# Patient Record
Sex: Male | Born: 1970 | Race: White | Hispanic: No
Health system: Southern US, Academic
[De-identification: ages and names within clinical notes are randomized; demographics above are authoritative.]

## PROBLEM LIST (undated history)

## (undated) ENCOUNTER — Encounter

## (undated) ENCOUNTER — Ambulatory Visit: Payer: MEDICARE | Attending: Speech-Language Pathologist | Primary: Speech-Language Pathologist

## (undated) ENCOUNTER — Telehealth
Attending: Student in an Organized Health Care Education/Training Program | Primary: Student in an Organized Health Care Education/Training Program

## (undated) ENCOUNTER — Encounter: Attending: Internal Medicine | Primary: Internal Medicine

## (undated) ENCOUNTER — Telehealth

## (undated) ENCOUNTER — Encounter
Attending: Student in an Organized Health Care Education/Training Program | Primary: Student in an Organized Health Care Education/Training Program

## (undated) ENCOUNTER — Encounter: Attending: Adult Health | Primary: Adult Health

## (undated) ENCOUNTER — Ambulatory Visit: Attending: Clinical | Primary: Clinical

## (undated) ENCOUNTER — Ambulatory Visit: Payer: MEDICARE

## (undated) ENCOUNTER — Encounter: Attending: Sports Medicine | Primary: Sports Medicine

## (undated) ENCOUNTER — Ambulatory Visit
Payer: MEDICARE | Attending: Student in an Organized Health Care Education/Training Program | Primary: Student in an Organized Health Care Education/Training Program

## (undated) ENCOUNTER — Ambulatory Visit: Payer: MEDICARE | Attending: Adult Health | Primary: Adult Health

## (undated) ENCOUNTER — Telehealth: Attending: Social Worker | Primary: Social Worker

## (undated) ENCOUNTER — Ambulatory Visit

## (undated) ENCOUNTER — Ambulatory Visit: Attending: Family Medicine | Primary: Family Medicine

## (undated) ENCOUNTER — Ambulatory Visit: Payer: Medicare (Managed Care) | Attending: Internal Medicine | Primary: Internal Medicine

## (undated) ENCOUNTER — Encounter: Attending: Social Worker | Primary: Social Worker

## (undated) ENCOUNTER — Ambulatory Visit: Payer: MEDICARE | Attending: Pulmonary Disease | Primary: Pulmonary Disease

## (undated) ENCOUNTER — Ambulatory Visit
Payer: MEDICARE | Attending: Neurology with Special Qualifications in Child Neurology | Primary: Neurology with Special Qualifications in Child Neurology

## (undated) ENCOUNTER — Ambulatory Visit: Payer: Medicare (Managed Care)

## (undated) ENCOUNTER — Ambulatory Visit: Attending: Adult Health | Primary: Adult Health

## (undated) ENCOUNTER — Ambulatory Visit: Payer: Medicare (Managed Care) | Attending: Adult Health | Primary: Adult Health

## (undated) ENCOUNTER — Ambulatory Visit: Payer: MEDICARE | Attending: Sports Medicine | Primary: Sports Medicine

## (undated) ENCOUNTER — Encounter: Attending: Psychiatry | Primary: Psychiatry

## (undated) ENCOUNTER — Ambulatory Visit: Payer: MEDICARE | Attending: Medical Oncology | Primary: Medical Oncology

## (undated) DIAGNOSIS — I447 Left bundle-branch block, unspecified: Secondary | ICD-10-CM

## (undated) DIAGNOSIS — T7840XA Allergy, unspecified, initial encounter: Secondary | ICD-10-CM

## (undated) DIAGNOSIS — J449 Chronic obstructive pulmonary disease, unspecified: Secondary | ICD-10-CM

## (undated) DIAGNOSIS — K219 Gastro-esophageal reflux disease without esophagitis: Secondary | ICD-10-CM

## (undated) DIAGNOSIS — F319 Bipolar disorder, unspecified: Secondary | ICD-10-CM

## (undated) HISTORY — DX: Gastro-esophageal reflux disease without esophagitis: K21.9

## (undated) HISTORY — DX: Left bundle-branch block, unspecified: I44.7

## (undated) HISTORY — DX: Allergy, unspecified, initial encounter: T78.40XA

## (undated) HISTORY — DX: Chronic obstructive pulmonary disease, unspecified: J44.9

## (undated) MED ORDER — FLUOXETINE 20 MG TABLET: ORAL | 0 days

---
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
## 2000-02-19 ENCOUNTER — Encounter: Payer: Self-pay | Admitting: Specialist

## 2000-02-19 ENCOUNTER — Ambulatory Visit (HOSPITAL_COMMUNITY): Admission: RE | Admit: 2000-02-19 | Discharge: 2000-02-19 | Payer: Self-pay | Admitting: Specialist

## 2000-02-28 ENCOUNTER — Encounter: Payer: Self-pay | Admitting: Specialist

## 2000-02-28 ENCOUNTER — Ambulatory Visit (HOSPITAL_COMMUNITY): Admission: RE | Admit: 2000-02-28 | Discharge: 2000-02-28 | Payer: Self-pay | Admitting: Specialist

## 2000-12-27 ENCOUNTER — Encounter: Payer: Self-pay | Admitting: Unknown Physician Specialty

## 2000-12-27 ENCOUNTER — Encounter: Admission: RE | Admit: 2000-12-27 | Discharge: 2000-12-27 | Payer: Self-pay | Admitting: Unknown Physician Specialty

## 2001-07-29 ENCOUNTER — Encounter: Payer: Self-pay | Admitting: Emergency Medicine

## 2001-07-29 ENCOUNTER — Emergency Department (HOSPITAL_COMMUNITY): Admission: AC | Admit: 2001-07-29 | Discharge: 2001-07-29 | Payer: Self-pay

## 2004-12-08 ENCOUNTER — Emergency Department: Payer: Self-pay | Admitting: Emergency Medicine

## 2004-12-21 ENCOUNTER — Emergency Department: Payer: Self-pay | Admitting: Emergency Medicine

## 2005-02-11 ENCOUNTER — Emergency Department: Payer: Self-pay | Admitting: Emergency Medicine

## 2005-04-04 ENCOUNTER — Inpatient Hospital Stay (HOSPITAL_COMMUNITY): Admission: RE | Admit: 2005-04-04 | Discharge: 2005-04-10 | Payer: Self-pay | Admitting: Psychiatry

## 2005-04-05 ENCOUNTER — Ambulatory Visit: Payer: Self-pay | Admitting: Psychiatry

## 2005-08-22 ENCOUNTER — Emergency Department: Payer: Self-pay | Admitting: Internal Medicine

## 2005-09-24 ENCOUNTER — Emergency Department: Payer: Self-pay | Admitting: Emergency Medicine

## 2006-02-26 ENCOUNTER — Emergency Department: Payer: Self-pay | Admitting: Emergency Medicine

## 2006-03-09 ENCOUNTER — Emergency Department: Payer: Self-pay | Admitting: General Practice

## 2006-05-06 ENCOUNTER — Ambulatory Visit: Payer: Self-pay | Admitting: Urology

## 2006-06-06 ENCOUNTER — Ambulatory Visit: Payer: Self-pay | Admitting: Urology

## 2006-09-05 ENCOUNTER — Emergency Department: Payer: Self-pay | Admitting: Emergency Medicine

## 2006-10-23 ENCOUNTER — Other Ambulatory Visit: Payer: Self-pay

## 2006-10-23 ENCOUNTER — Emergency Department: Payer: Self-pay | Admitting: Emergency Medicine

## 2006-11-04 ENCOUNTER — Ambulatory Visit: Payer: Self-pay | Admitting: Internal Medicine

## 2006-11-27 ENCOUNTER — Emergency Department: Payer: Self-pay | Admitting: Emergency Medicine

## 2007-01-26 ENCOUNTER — Emergency Department: Payer: Self-pay | Admitting: Emergency Medicine

## 2007-01-26 ENCOUNTER — Other Ambulatory Visit: Payer: Self-pay

## 2007-12-09 ENCOUNTER — Emergency Department: Payer: Self-pay | Admitting: Emergency Medicine

## 2008-04-11 ENCOUNTER — Emergency Department: Payer: Self-pay | Admitting: Emergency Medicine

## 2008-06-16 ENCOUNTER — Inpatient Hospital Stay: Payer: Self-pay | Admitting: Psychiatry

## 2011-05-16 ENCOUNTER — Emergency Department: Payer: Self-pay | Admitting: Emergency Medicine

## 2011-12-31 ENCOUNTER — Emergency Department: Payer: Self-pay | Admitting: Emergency Medicine

## 2011-12-31 LAB — CBC WITH DIFFERENTIAL/PLATELET
Basophil #: 0 10*3/uL (ref 0.0–0.1)
Basophil %: 0.5 %
Eosinophil #: 0.1 10*3/uL (ref 0.0–0.7)
Eosinophil %: 0.9 %
HCT: 35.5 % — ABNORMAL LOW (ref 40.0–52.0)
HGB: 12.6 g/dL — ABNORMAL LOW (ref 13.0–18.0)
Lymphocyte #: 1.2 10*3/uL (ref 1.0–3.6)
Lymphocyte %: 14.9 %
MCH: 29.8 pg (ref 26.0–34.0)
MCHC: 35.4 g/dL (ref 32.0–36.0)
MCV: 84 fL (ref 80–100)
Monocyte #: 0.7 x10 3/mm (ref 0.2–1.0)
Monocyte %: 9.2 %
Neutrophil #: 6 10*3/uL (ref 1.4–6.5)
Neutrophil %: 74.5 %
Platelet: 220 10*3/uL (ref 150–440)
RBC: 4.22 10*6/uL — ABNORMAL LOW (ref 4.40–5.90)
RDW: 13.9 % (ref 11.5–14.5)
WBC: 8 10*3/uL (ref 3.8–10.6)

## 2011-12-31 LAB — BASIC METABOLIC PANEL
Anion Gap: 8 (ref 7–16)
BUN: 10 mg/dL (ref 7–18)
Calcium, Total: 10 mg/dL (ref 8.5–10.1)
Chloride: 105 mmol/L (ref 98–107)
Co2: 26 mmol/L (ref 21–32)
Creatinine: 1.38 mg/dL — ABNORMAL HIGH (ref 0.60–1.30)
EGFR (African American): >60
EGFR (Non-African Amer.): >60
Glucose: 98 mg/dL (ref 65–99)
Osmolality: 277 (ref 275–301)
Potassium: 3.5 mmol/L (ref 3.5–5.1)
Sodium: 139 mmol/L (ref 136–145)

## 2012-01-01 ENCOUNTER — Ambulatory Visit: Payer: Self-pay | Admitting: Oncology

## 2012-01-02 ENCOUNTER — Ambulatory Visit: Payer: Self-pay | Admitting: Oncology

## 2012-01-02 LAB — PROTIME-INR
INR: 1.1
Prothrombin Time: 15 secs — ABNORMAL HIGH (ref 11.5–14.7)

## 2012-01-02 LAB — APTT: Activated PTT: 32.6 secs (ref 23.6–35.9)

## 2012-01-10 ENCOUNTER — Ambulatory Visit: Payer: Self-pay | Admitting: Surgery

## 2012-01-24 LAB — PATHOLOGY REPORT

## 2012-01-25 ENCOUNTER — Ambulatory Visit: Payer: Self-pay | Admitting: Oncology

## 2012-01-28 ENCOUNTER — Ambulatory Visit: Payer: Self-pay | Admitting: Oncology

## 2012-02-06 LAB — CBC CANCER CENTER
Basophil #: 0 x10 3/mm (ref 0.0–0.1)
Basophil %: 0.6 %
Eosinophil #: 0.2 x10 3/mm (ref 0.0–0.7)
Eosinophil %: 2.3 %
HCT: 36.3 % — ABNORMAL LOW (ref 40.0–52.0)
HGB: 12.3 g/dL — ABNORMAL LOW (ref 13.0–18.0)
Lymphocyte #: 1.4 x10 3/mm (ref 1.0–3.6)
Lymphocyte %: 17 %
MCH: 29.5 pg (ref 26.0–34.0)
MCHC: 33.9 g/dL (ref 32.0–36.0)
MCV: 87 fL (ref 80–100)
Monocyte #: 0.5 x10 3/mm (ref 0.2–1.0)
Monocyte %: 5.5 %
Neutrophil #: 6.4 x10 3/mm (ref 1.4–6.5)
Neutrophil %: 74.6 %
Platelet: 225 x10 3/mm (ref 150–440)
RBC: 4.17 10*6/uL — ABNORMAL LOW (ref 4.40–5.90)
RDW: 14.1 % (ref 11.5–14.5)
WBC: 8.5 x10 3/mm (ref 3.8–10.6)

## 2012-02-06 LAB — COMPREHENSIVE METABOLIC PANEL WITH GFR
Albumin: 4.1 g/dL
Alkaline Phosphatase: 70 U/L
Anion Gap: 11
BUN: 13 mg/dL
Bilirubin,Total: 0.2 mg/dL
Calcium, Total: 10.2 mg/dL — ABNORMAL HIGH
Chloride: 101 mmol/L
Co2: 26 mmol/L
Creatinine: 1.43 mg/dL — ABNORMAL HIGH
EGFR (African American): >60
EGFR (Non-African Amer.): 60 — ABNORMAL LOW
Glucose: 160 mg/dL — ABNORMAL HIGH
Osmolality: 279
Potassium: 4 mmol/L
SGOT(AST): 15 U/L
SGPT (ALT): 28 U/L
Sodium: 138 mmol/L
Total Protein: 8 g/dL

## 2012-02-13 LAB — COMPREHENSIVE METABOLIC PANEL
Albumin: 4 g/dL (ref 3.4–5.0)
Alkaline Phosphatase: 66 U/L (ref 50–136)
Anion Gap: 10 (ref 7–16)
BUN: 13 mg/dL (ref 7–18)
Bilirubin,Total: 0.1 mg/dL — ABNORMAL LOW (ref 0.2–1.0)
Calcium, Total: 9.4 mg/dL (ref 8.5–10.1)
Chloride: 102 mmol/L (ref 98–107)
Co2: 26 mmol/L (ref 21–32)
Creatinine: 1.1 mg/dL (ref 0.60–1.30)
EGFR (African American): >60
EGFR (Non-African Amer.): >60
Glucose: 101 mg/dL — ABNORMAL HIGH (ref 65–99)
Osmolality: 276 (ref 275–301)
Potassium: 3.9 mmol/L (ref 3.5–5.1)
SGOT(AST): 21 U/L (ref 15–37)
SGPT (ALT): 43 U/L (ref 12–78)
Sodium: 138 mmol/L (ref 136–145)
Total Protein: 7.9 g/dL (ref 6.4–8.2)

## 2012-02-13 LAB — CBC CANCER CENTER
Basophil #: 0 x10 3/mm (ref 0.0–0.1)
Basophil %: 0.8 %
Eosinophil #: 0.2 x10 3/mm (ref 0.0–0.7)
Eosinophil %: 4.7 %
HCT: 33.6 % — ABNORMAL LOW (ref 40.0–52.0)
HGB: 11.4 g/dL — ABNORMAL LOW (ref 13.0–18.0)
Lymphocyte #: 1.2 x10 3/mm (ref 1.0–3.6)
Lymphocyte %: 32.7 %
MCH: 29 pg (ref 26.0–34.0)
MCHC: 33.8 g/dL (ref 32.0–36.0)
MCV: 86 fL (ref 80–100)
Monocyte #: 0.1 x10 3/mm — ABNORMAL LOW (ref 0.2–1.0)
Monocyte %: 1.6 %
Neutrophil #: 2.2 x10 3/mm (ref 1.4–6.5)
Neutrophil %: 60.2 %
Platelet: 220 x10 3/mm (ref 150–440)
RBC: 3.93 10*6/uL — ABNORMAL LOW (ref 4.40–5.90)
RDW: 13.9 % (ref 11.5–14.5)
WBC: 3.6 x10 3/mm — ABNORMAL LOW (ref 3.8–10.6)

## 2012-02-20 LAB — COMPREHENSIVE METABOLIC PANEL
Albumin: 3.8 g/dL (ref 3.4–5.0)
Alkaline Phosphatase: 66 U/L (ref 50–136)
BUN: 9 mg/dL (ref 7–18)
Bilirubin,Total: 0.2 mg/dL (ref 0.2–1.0)
Chloride: 104 mmol/L (ref 98–107)
EGFR (Non-African Amer.): >60
Glucose: 139 mg/dL — ABNORMAL HIGH (ref 65–99)
Osmolality: 282 (ref 275–301)
SGOT(AST): 40 U/L — ABNORMAL HIGH (ref 15–37)
SGPT (ALT): 68 U/L (ref 12–78)
Total Protein: 7.6 g/dL (ref 6.4–8.2)

## 2012-02-20 LAB — CBC CANCER CENTER
Basophil #: 0 x10 3/mm (ref 0.0–0.1)
Basophil %: 0.9 %
Eosinophil #: 0.1 x10 3/mm (ref 0.0–0.7)
Eosinophil %: 3.2 %
HCT: 35.5 % — ABNORMAL LOW (ref 40.0–52.0)
HGB: 11.8 g/dL — ABNORMAL LOW (ref 13.0–18.0)
Lymphocyte #: 1.3 x10 3/mm (ref 1.0–3.6)
Lymphocyte %: 59 %
MCH: 28.6 pg (ref 26.0–34.0)
MCHC: 33.2 g/dL (ref 32.0–36.0)
MCV: 86 fL (ref 80–100)
Monocyte #: 0.2 x10 3/mm (ref 0.2–1.0)
Monocyte %: 11.1 %
Neutrophil #: 0.6 x10 3/mm — ABNORMAL LOW (ref 1.4–6.5)
Neutrophil %: 25.8 %
Platelet: 257 x10 3/mm (ref 150–440)
RBC: 4.12 10*6/uL — ABNORMAL LOW (ref 4.40–5.90)
RDW: 14.2 % (ref 11.5–14.5)
WBC: 2.2 x10 3/mm — ABNORMAL LOW (ref 3.8–10.6)

## 2012-02-24 ENCOUNTER — Ambulatory Visit: Payer: Self-pay | Admitting: Oncology

## 2012-02-27 LAB — COMPREHENSIVE METABOLIC PANEL
Albumin: 4 g/dL (ref 3.4–5.0)
Alkaline Phosphatase: 60 U/L (ref 50–136)
Anion Gap: 12 (ref 7–16)
Bilirubin,Total: 0.2 mg/dL (ref 0.2–1.0)
Calcium, Total: 9.1 mg/dL (ref 8.5–10.1)
Chloride: 105 mmol/L (ref 98–107)
EGFR (African American): >60
Potassium: 3.7 mmol/L (ref 3.5–5.1)
SGOT(AST): 26 U/L (ref 15–37)
Total Protein: 7.9 g/dL (ref 6.4–8.2)

## 2012-02-27 LAB — CBC CANCER CENTER
Basophil %: 1 %
Eosinophil %: 0.6 %
HGB: 12.5 g/dL — ABNORMAL LOW (ref 13.0–18.0)
Lymphocyte #: 2 x10 3/mm (ref 1.0–3.6)
Monocyte %: 9.9 %
Neutrophil #: 3 x10 3/mm (ref 1.4–6.5)
Neutrophil %: 53.5 %
RBC: 4.41 10*6/uL (ref 4.40–5.90)
WBC: 5.7 x10 3/mm (ref 3.8–10.6)

## 2012-03-05 LAB — CBC CANCER CENTER
Basophil %: 0.6 %
Eosinophil #: 0.1 x10 3/mm (ref 0.0–0.7)
HCT: 35.6 % — ABNORMAL LOW (ref 40.0–52.0)
HGB: 12.2 g/dL — ABNORMAL LOW (ref 13.0–18.0)
Lymphocyte #: 1.6 x10 3/mm (ref 1.0–3.6)
Lymphocyte %: 18.9 %
MCH: 28.2 pg (ref 26.0–34.0)
MCHC: 34.3 g/dL (ref 32.0–36.0)
MCV: 82 fL (ref 80–100)
Monocyte %: 3.6 %
Neutrophil #: 6.5 x10 3/mm (ref 1.4–6.5)
Platelet: 164 x10 3/mm (ref 150–440)
RBC: 4.32 10*6/uL — ABNORMAL LOW (ref 4.40–5.90)
RDW: 13.8 % (ref 11.5–14.5)

## 2012-03-11 ENCOUNTER — Ambulatory Visit: Payer: Self-pay | Admitting: Vascular Surgery

## 2012-03-12 LAB — CBC CANCER CENTER
Basophil #: 0 x10 3/mm (ref 0.0–0.1)
Basophil %: 0.4 %
Eosinophil #: 0.1 x10 3/mm (ref 0.0–0.7)
HCT: 35.6 % — ABNORMAL LOW (ref 40.0–52.0)
HGB: 12.2 g/dL — ABNORMAL LOW (ref 13.0–18.0)
Lymphocyte #: 1.5 x10 3/mm (ref 1.0–3.6)
Lymphocyte %: 24.2 %
MCH: 28.5 pg (ref 26.0–34.0)
MCHC: 34.1 g/dL (ref 32.0–36.0)
MCV: 83 fL (ref 80–100)
Monocyte #: 0.4 x10 3/mm (ref 0.2–1.0)
Neutrophil #: 4.2 x10 3/mm (ref 1.4–6.5)
RBC: 4.27 10*6/uL — ABNORMAL LOW (ref 4.40–5.90)
RDW: 14.4 % (ref 11.5–14.5)

## 2012-03-12 LAB — COMPREHENSIVE METABOLIC PANEL
Alkaline Phosphatase: 80 U/L (ref 50–136)
Anion Gap: 11 (ref 7–16)
BUN: 11 mg/dL (ref 7–18)
Chloride: 104 mmol/L (ref 98–107)
EGFR (African American): >60
EGFR (Non-African Amer.): >60
Potassium: 3.7 mmol/L (ref 3.5–5.1)
SGOT(AST): 21 U/L (ref 15–37)
Total Protein: 7.6 g/dL (ref 6.4–8.2)

## 2012-03-26 ENCOUNTER — Ambulatory Visit: Payer: Self-pay | Admitting: Oncology

## 2012-03-27 LAB — COMPREHENSIVE METABOLIC PANEL
Bilirubin,Total: 0.2 mg/dL (ref 0.2–1.0)
Calcium, Total: 9 mg/dL (ref 8.5–10.1)
Co2: 24 mmol/L (ref 21–32)
Creatinine: 0.98 mg/dL (ref 0.60–1.30)
EGFR (African American): >60
Osmolality: 280 (ref 275–301)
Potassium: 3.8 mmol/L (ref 3.5–5.1)
SGPT (ALT): 50 U/L (ref 12–78)
Total Protein: 7.1 g/dL (ref 6.4–8.2)

## 2012-03-27 LAB — CBC CANCER CENTER
Basophil #: 0 x10 3/mm (ref 0.0–0.1)
Basophil %: 0.3 %
Eosinophil #: 0.2 x10 3/mm (ref 0.0–0.7)
HGB: 11 g/dL — ABNORMAL LOW (ref 13.0–18.0)
Lymphocyte #: 1.5 x10 3/mm (ref 1.0–3.6)
MCV: 82 fL (ref 80–100)
Monocyte %: 6.4 %
Neutrophil #: 7.1 x10 3/mm — ABNORMAL HIGH (ref 1.4–6.5)
RBC: 3.86 10*6/uL — ABNORMAL LOW (ref 4.40–5.90)
RDW: 15.7 % — ABNORMAL HIGH (ref 11.5–14.5)
WBC: 9.4 x10 3/mm (ref 3.8–10.6)

## 2012-04-09 LAB — COMPREHENSIVE METABOLIC PANEL
Albumin: 3.9 g/dL (ref 3.4–5.0)
Alkaline Phosphatase: 107 U/L (ref 50–136)
BUN: 17 mg/dL (ref 7–18)
Calcium, Total: 9.3 mg/dL (ref 8.5–10.1)
Co2: 27 mmol/L (ref 21–32)
EGFR (African American): >60
EGFR (Non-African Amer.): >60
Glucose: 122 mg/dL — ABNORMAL HIGH (ref 65–99)
Potassium: 3.8 mmol/L (ref 3.5–5.1)
Sodium: 139 mmol/L (ref 136–145)
Total Protein: 7.3 g/dL (ref 6.4–8.2)

## 2012-04-09 LAB — CBC CANCER CENTER
Basophil #: 0.1 x10 3/mm (ref 0.0–0.1)
Basophil %: 0.8 %
Eosinophil #: 0.1 x10 3/mm (ref 0.0–0.7)
Lymphocyte %: 17.3 %
Monocyte #: 0.8 x10 3/mm (ref 0.2–1.0)
Neutrophil #: 7.4 x10 3/mm — ABNORMAL HIGH (ref 1.4–6.5)
RBC: 3.84 10*6/uL — ABNORMAL LOW (ref 4.40–5.90)
RDW: 16.8 % — ABNORMAL HIGH (ref 11.5–14.5)
WBC: 10.1 x10 3/mm (ref 3.8–10.6)

## 2012-04-26 ENCOUNTER — Ambulatory Visit: Payer: Self-pay | Admitting: Oncology

## 2012-04-30 LAB — CBC CANCER CENTER
Basophil #: 0.1 x10 3/mm (ref 0.0–0.1)
Basophil %: 1 %
Eosinophil #: 0.1 x10 3/mm (ref 0.0–0.7)
Eosinophil %: 1.4 %
HCT: 33.4 % — ABNORMAL LOW (ref 40.0–52.0)
Lymphocyte #: 1.3 x10 3/mm (ref 1.0–3.6)
Lymphocyte %: 21.4 %
MCH: 27.9 pg (ref 26.0–34.0)
MCHC: 33.9 g/dL (ref 32.0–36.0)
MCV: 82 fL (ref 80–100)
Monocyte %: 7.6 %
Neutrophil #: 4.3 x10 3/mm (ref 1.4–6.5)
Neutrophil %: 68.6 %
Platelet: 224 x10 3/mm (ref 150–440)
RBC: 4.05 10*6/uL — ABNORMAL LOW (ref 4.40–5.90)
RDW: 18 % — ABNORMAL HIGH (ref 11.5–14.5)

## 2012-04-30 LAB — COMPREHENSIVE METABOLIC PANEL
Alkaline Phosphatase: 78 U/L (ref 50–136)
Anion Gap: 11 (ref 7–16)
Chloride: 104 mmol/L (ref 98–107)
Co2: 27 mmol/L (ref 21–32)
Creatinine: 1.11 mg/dL (ref 0.60–1.30)
Glucose: 140 mg/dL — ABNORMAL HIGH (ref 65–99)
Osmolality: 284 (ref 275–301)
SGPT (ALT): 69 U/L (ref 12–78)

## 2012-05-15 ENCOUNTER — Ambulatory Visit: Payer: Self-pay | Admitting: Oncology

## 2012-05-24 ENCOUNTER — Ambulatory Visit: Payer: Self-pay | Admitting: Oncology

## 2012-06-24 ENCOUNTER — Ambulatory Visit: Payer: Self-pay | Admitting: Oncology

## 2012-07-09 LAB — CBC CANCER CENTER
Basophil #: 0 x10 3/mm (ref 0.0–0.1)
Eosinophil #: 0.1 x10 3/mm (ref 0.0–0.7)
Eosinophil %: 3.3 %
HCT: 40.7 % (ref 40.0–52.0)
MCH: 26.8 pg (ref 26.0–34.0)
MCV: 80 fL (ref 80–100)
Monocyte #: 0.2 x10 3/mm (ref 0.2–1.0)
Monocyte %: 7 %

## 2012-07-16 LAB — CBC CANCER CENTER
Basophil #: 0 x10 3/mm (ref 0.0–0.1)
Basophil %: 0.3 %
Eosinophil #: 0.1 x10 3/mm (ref 0.0–0.7)
Eosinophil %: 2.8 %
HCT: 38 % — ABNORMAL LOW (ref 40.0–52.0)
HGB: 12.9 g/dL — ABNORMAL LOW (ref 13.0–18.0)
Lymphocyte #: 0.4 x10 3/mm — ABNORMAL LOW (ref 1.0–3.6)
Lymphocyte %: 10.3 %
MCH: 26.5 pg (ref 26.0–34.0)
MCHC: 33.8 g/dL (ref 32.0–36.0)
Monocyte #: 0.3 x10 3/mm (ref 0.2–1.0)
Neutrophil #: 2.7 x10 3/mm (ref 1.4–6.5)
Neutrophil %: 77.6 %
Platelet: 120 x10 3/mm — ABNORMAL LOW (ref 150–440)
RDW: 15.6 % — ABNORMAL HIGH (ref 11.5–14.5)
WBC: 3.5 x10 3/mm — ABNORMAL LOW (ref 3.8–10.6)

## 2012-07-24 ENCOUNTER — Ambulatory Visit: Payer: Self-pay | Admitting: Oncology

## 2012-09-08 ENCOUNTER — Emergency Department: Payer: Self-pay | Admitting: Emergency Medicine

## 2012-09-08 LAB — COMPREHENSIVE METABOLIC PANEL
Albumin: 4.1 g/dL (ref 3.4–5.0)
Anion Gap: 8 (ref 7–16)
Bilirubin,Total: 0.3 mg/dL (ref 0.2–1.0)
Chloride: 107 mmol/L (ref 98–107)
Co2: 24 mmol/L (ref 21–32)
Creatinine: 1.03 mg/dL (ref 0.60–1.30)
Osmolality: 275 (ref 275–301)
Potassium: 3.6 mmol/L (ref 3.5–5.1)
SGOT(AST): 20 U/L (ref 15–37)
SGPT (ALT): 31 U/L (ref 12–78)
Sodium: 139 mmol/L (ref 136–145)

## 2012-09-08 LAB — CBC
HCT: 33.6 % — ABNORMAL LOW (ref 40.0–52.0)
MCHC: 35.4 g/dL (ref 32.0–36.0)
MCV: 77 fL — ABNORMAL LOW (ref 80–100)
Platelet: 207 10*3/uL (ref 150–440)
RBC: 4.39 10*6/uL — ABNORMAL LOW (ref 4.40–5.90)
WBC: 5.3 10*3/uL (ref 3.8–10.6)

## 2012-09-08 LAB — DRUG SCREEN, URINE

## 2012-09-08 LAB — URINALYSIS, COMPLETE
Bacteria: NONE SEEN
Glucose,UR: NEGATIVE mg/dL (ref 0–75)
Nitrite: NEGATIVE
Ph: 6 (ref 4.5–8.0)
RBC,UR: <1 /HPF (ref 0–5)
WBC UR: NONE SEEN /HPF (ref 0–5)

## 2012-09-08 LAB — TSH: Thyroid Stimulating Horm: 0.09 u[IU]/mL — ABNORMAL LOW

## 2012-09-08 LAB — ETHANOL: Ethanol %: <0.003 % (ref 0.000–0.080)

## 2012-09-25 ENCOUNTER — Ambulatory Visit: Payer: Self-pay | Admitting: Oncology

## 2012-10-13 ENCOUNTER — Ambulatory Visit: Payer: Self-pay | Admitting: Oncology

## 2012-10-20 LAB — BASIC METABOLIC PANEL
BUN: 12 mg/dL (ref 7–18)
Chloride: 105 mmol/L (ref 98–107)
Co2: 26 mmol/L (ref 21–32)
Glucose: 97 mg/dL (ref 65–99)
Sodium: 141 mmol/L (ref 136–145)

## 2012-10-20 LAB — CBC CANCER CENTER
Basophil #: 0 x10 3/mm (ref 0.0–0.1)
Eosinophil #: 0 x10 3/mm (ref 0.0–0.7)
Eosinophil %: 0.9 %
HCT: 34.9 % — ABNORMAL LOW (ref 40.0–52.0)
Lymphocyte #: 1 x10 3/mm (ref 1.0–3.6)
Lymphocyte %: 21.3 %
MCHC: 35.1 g/dL (ref 32.0–36.0)
MCV: 80 fL (ref 80–100)
Monocyte %: 6.3 %
Neutrophil %: 71.2 %
RDW: 15.9 % — ABNORMAL HIGH (ref 11.5–14.5)
WBC: 4.5 x10 3/mm (ref 3.8–10.6)

## 2012-10-24 ENCOUNTER — Ambulatory Visit: Payer: Self-pay | Admitting: Oncology

## 2013-01-19 ENCOUNTER — Ambulatory Visit: Payer: Self-pay | Admitting: Oncology

## 2013-01-19 LAB — LACTATE DEHYDROGENASE: LDH: 148 U/L (ref 85–241)

## 2013-01-24 ENCOUNTER — Ambulatory Visit: Payer: Self-pay | Admitting: Oncology

## 2013-04-20 ENCOUNTER — Ambulatory Visit: Payer: Self-pay | Admitting: Oncology

## 2013-05-04 ENCOUNTER — Ambulatory Visit: Payer: Self-pay | Admitting: Oncology

## 2013-05-05 ENCOUNTER — Ambulatory Visit: Payer: Self-pay | Admitting: Oncology

## 2013-05-05 LAB — BASIC METABOLIC PANEL
Anion Gap: 13 (ref 7–16)
BUN: 14 mg/dL (ref 7–18)
CALCIUM: 8.9 mg/dL (ref 8.5–10.1)
Chloride: 103 mmol/L (ref 98–107)
Co2: 24 mmol/L (ref 21–32)
Creatinine: 1.04 mg/dL (ref 0.60–1.30)
GLUCOSE: 97 mg/dL (ref 65–99)
OSMOLALITY: 280 (ref 275–301)
POTASSIUM: 4.6 mmol/L (ref 3.5–5.1)
SODIUM: 140 mmol/L (ref 136–145)

## 2013-05-05 LAB — CBC CANCER CENTER
BASOS PCT: 0.6 %
Basophil #: 0 x10 3/mm (ref 0.0–0.1)
Eosinophil #: 0 x10 3/mm (ref 0.0–0.7)
Eosinophil %: 1 %
HCT: 40.6 % (ref 40.0–52.0)
HGB: 13.7 g/dL (ref 13.0–18.0)
LYMPHS ABS: 1.4 x10 3/mm (ref 1.0–3.6)
LYMPHS PCT: 28.7 %
MCH: 29.2 pg (ref 26.0–34.0)
MCHC: 33.9 g/dL (ref 32.0–36.0)
MCV: 86 fL (ref 80–100)
MONOS PCT: 6.9 %
Monocyte #: 0.3 x10 3/mm (ref 0.2–1.0)
NEUTROS ABS: 3.1 x10 3/mm (ref 1.4–6.5)
Neutrophil %: 62.8 %
PLATELETS: 164 x10 3/mm (ref 150–440)
RBC: 4.7 10*6/uL (ref 4.40–5.90)
RDW: 14.7 % — ABNORMAL HIGH (ref 11.5–14.5)
WBC: 5 x10 3/mm (ref 3.8–10.6)

## 2013-05-05 LAB — LACTATE DEHYDROGENASE: LDH: 151 U/L (ref 85–241)

## 2013-05-24 ENCOUNTER — Ambulatory Visit: Payer: Self-pay | Admitting: Oncology

## 2013-10-26 ENCOUNTER — Ambulatory Visit: Payer: Self-pay | Admitting: Oncology

## 2013-10-29 ENCOUNTER — Ambulatory Visit: Payer: Self-pay | Admitting: Oncology

## 2013-11-17 ENCOUNTER — Ambulatory Visit: Payer: Self-pay | Admitting: Vascular Surgery

## 2013-11-24 ENCOUNTER — Ambulatory Visit: Payer: Self-pay | Admitting: Oncology

## 2013-11-30 LAB — URINALYSIS, COMPLETE
Bacteria: NONE SEEN
Bilirubin,UR: NEGATIVE
Blood: NEGATIVE
GLUCOSE, UR: NEGATIVE mg/dL (ref 0–75)
Ketone: NEGATIVE
Leukocyte Esterase: NEGATIVE
NITRITE: NEGATIVE
Ph: 7 (ref 4.5–8.0)
Protein: NEGATIVE
RBC,UR: NONE SEEN /HPF (ref 0–5)
Specific Gravity: 1.017 (ref 1.003–1.030)
Squamous Epithelial: NONE SEEN
WBC UR: NONE SEEN /HPF (ref 0–5)

## 2013-11-30 LAB — COMPREHENSIVE METABOLIC PANEL
ALT: 33 U/L
Albumin: 3.8 g/dL (ref 3.4–5.0)
Alkaline Phosphatase: 71 U/L
Anion Gap: 7 (ref 7–16)
BUN: 20 mg/dL — ABNORMAL HIGH (ref 7–18)
Bilirubin,Total: 0.4 mg/dL (ref 0.2–1.0)
CREATININE: 1.15 mg/dL (ref 0.60–1.30)
Calcium, Total: 9.6 mg/dL (ref 8.5–10.1)
Chloride: 107 mmol/L (ref 98–107)
Co2: 27 mmol/L (ref 21–32)
EGFR (African American): >60
EGFR (Non-African Amer.): >60
GLUCOSE: 105 mg/dL — AB (ref 65–99)
Osmolality: 284 (ref 275–301)
POTASSIUM: 3.7 mmol/L (ref 3.5–5.1)
SGOT(AST): 27 U/L (ref 15–37)
Sodium: 141 mmol/L (ref 136–145)
Total Protein: 7.3 g/dL (ref 6.4–8.2)

## 2013-11-30 LAB — DRUG SCREEN, URINE

## 2013-11-30 LAB — CBC
HCT: 41 % (ref 40.0–52.0)
HGB: 13.7 g/dL (ref 13.0–18.0)
MCH: 29.2 pg (ref 26.0–34.0)
MCHC: 33.4 g/dL (ref 32.0–36.0)
MCV: 88 fL (ref 80–100)
Platelet: 196 10*3/uL (ref 150–440)
RBC: 4.69 10*6/uL (ref 4.40–5.90)
RDW: 14.4 % (ref 11.5–14.5)
WBC: 9.1 10*3/uL (ref 3.8–10.6)

## 2013-11-30 LAB — ACETAMINOPHEN LEVEL
ACETAMINOPHEN: 2 ug/mL — AB
Acetaminophen: <2 ug/mL

## 2013-11-30 LAB — ETHANOL

## 2013-11-30 LAB — TROPONIN I

## 2013-11-30 LAB — SALICYLATE LEVEL

## 2013-12-01 ENCOUNTER — Inpatient Hospital Stay: Payer: Self-pay | Admitting: Psychiatry

## 2013-12-03 LAB — TSH: Thyroid Stimulating Horm: 0.972 u[IU]/mL

## 2014-01-15 IMAGING — NM NM  CARDIAC MUGA REST SCAN 2 0F 2
6 series · 38 of 38 positions shown · non-contrast
Comparison: none

REASON FOR EXAM: pre chemo
COMMENTS:

[Series 1000: 45 lao-gated · 3.30mm/px · 6 of 24 frames shown]
[frame 3/24]
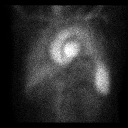
[frame 7/24]
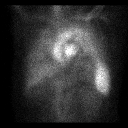
[frame 11/24]
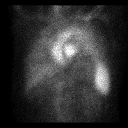
[frame 15/24]
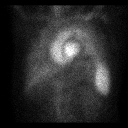
[frame 19/24]
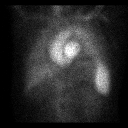
[frame 23/24]
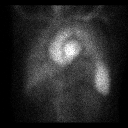

[Series 1000: 70 degree-gated · 3.30mm/px · 6 of 24 frames shown]
[frame 3/24]
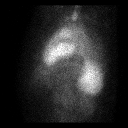
[frame 7/24]
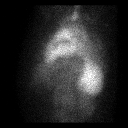
[frame 11/24]
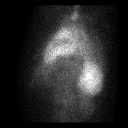
[frame 15/24]
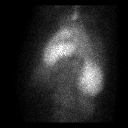
[frame 19/24]
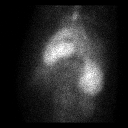
[frame 23/24]
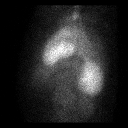

[Series 1000: 45 lao-gated (functional) · 3.30mm/px · 8 of 8 slices shown]
[im 1/8]
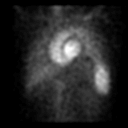
[im 2/8  full-range]
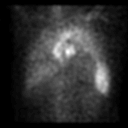
[im 3/8]
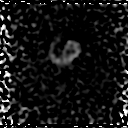
[im 4/8  full-range]
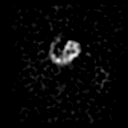
[im 5/8  full-range]
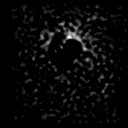
[im 6/8  full-range]
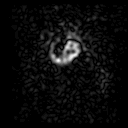
[im 7/8]
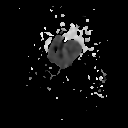
[im 8/8]
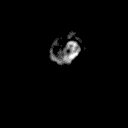

[Series 1000: 45 lao-gated (original with roi) · 3.30mm/px · 6 of 24 frames shown]
[frame 3/24]
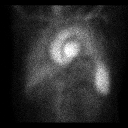
[frame 7/24]
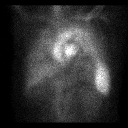
[frame 11/24]
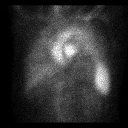
[frame 15/24]
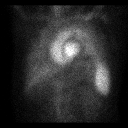
[frame 19/24]
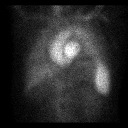
[frame 23/24]
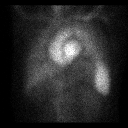

[Series 1000: 45 lao-gated (results) · 3.30mm/px · 6 of 24 frames shown]
[frame 3/24]
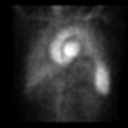
[frame 7/24]
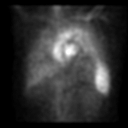
[frame 11/24]
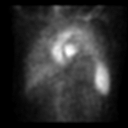
[frame 15/24]
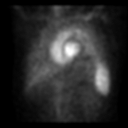
[frame 19/24]
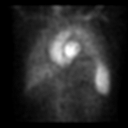
[frame 23/24]
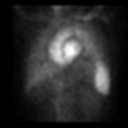

[Series 1000: anterior-gated · 3.30mm/px · 6 of 24 frames shown]
[frame 3/24]
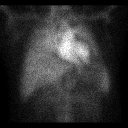
[frame 7/24]
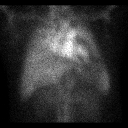
[frame 11/24]
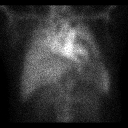
[frame 15/24]
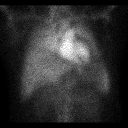
[frame 19/24]
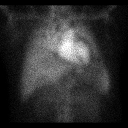
[frame 23/24]
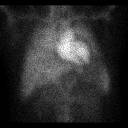

[38 of 38 positions shown; findings below may reference images not displayed]

PROCEDURE:     NM  - NM REST MUGA SCAN [DATE] OF [DATE]  - [DATE]  [DATE] [DATE] [DATE]

RESULT:     The patient underwent labeling of the blood pool with 21.6 mCi
of technetium 99m labeled pertechnetate with 3 mL of pyrophosphate. Scanning
was then performed with images acquired in the anterior, LAO 45, and LAO 70
projections.

The calculated ejection fraction is approximately 60%. There is concentric
left ventricular wall motion.
IMPRESSION: 1. There is a normal left ventricular ejection fraction of 60%.
2. There is concentric left ventricular wall motion.

[REDACTED]

## 2014-04-15 ENCOUNTER — Emergency Department: Payer: Self-pay | Admitting: Emergency Medicine

## 2014-04-15 DIAGNOSIS — R112 Nausea with vomiting, unspecified: Secondary | ICD-10-CM | POA: Diagnosis not present

## 2014-04-15 DIAGNOSIS — Z720 Tobacco use: Secondary | ICD-10-CM | POA: Diagnosis not present

## 2014-04-15 DIAGNOSIS — R1310 Dysphagia, unspecified: Secondary | ICD-10-CM | POA: Diagnosis not present

## 2014-04-15 DIAGNOSIS — R0989 Other specified symptoms and signs involving the circulatory and respiratory systems: Secondary | ICD-10-CM | POA: Diagnosis not present

## 2014-04-15 DIAGNOSIS — K029 Dental caries, unspecified: Secondary | ICD-10-CM | POA: Diagnosis not present

## 2014-04-15 DIAGNOSIS — J441 Chronic obstructive pulmonary disease with (acute) exacerbation: Secondary | ICD-10-CM | POA: Diagnosis not present

## 2014-04-15 DIAGNOSIS — C8590 Non-Hodgkin lymphoma, unspecified, unspecified site: Secondary | ICD-10-CM | POA: Diagnosis not present

## 2014-04-15 DIAGNOSIS — Z880 Allergy status to penicillin: Secondary | ICD-10-CM | POA: Diagnosis not present

## 2014-04-15 LAB — COMPREHENSIVE METABOLIC PANEL
ALBUMIN: 4.4 g/dL (ref 3.4–5.0)
ALK PHOS: 84 U/L
AST: 35 U/L (ref 15–37)
Anion Gap: 7 (ref 7–16)
BILIRUBIN TOTAL: 0.3 mg/dL (ref 0.2–1.0)
BUN: 9 mg/dL (ref 7–18)
CREATININE: 1.06 mg/dL (ref 0.60–1.30)
Calcium, Total: 9.6 mg/dL (ref 8.5–10.1)
Chloride: 102 mmol/L (ref 98–107)
Co2: 31 mmol/L (ref 21–32)
EGFR (African American): >60
EGFR (Non-African Amer.): >60
GLUCOSE: 124 mg/dL — AB (ref 65–99)
Osmolality: 280 (ref 275–301)
Potassium: 3.6 mmol/L (ref 3.5–5.1)
SGPT (ALT): 49 U/L
Sodium: 140 mmol/L (ref 136–145)
Total Protein: 8.5 g/dL — ABNORMAL HIGH (ref 6.4–8.2)

## 2014-04-15 LAB — CBC WITH DIFFERENTIAL/PLATELET
BASOS ABS: 0 10*3/uL (ref 0.0–0.1)
BASOS PCT: 0.3 %
Eosinophil #: 0.1 10*3/uL (ref 0.0–0.7)
Eosinophil %: 1.4 %
HCT: 43.8 % (ref 40.0–52.0)
HGB: 14.6 g/dL (ref 13.0–18.0)
LYMPHS ABS: 1.8 10*3/uL (ref 1.0–3.6)
Lymphocyte %: 35.7 %
MCH: 29.1 pg (ref 26.0–34.0)
MCHC: 33.4 g/dL (ref 32.0–36.0)
MCV: 87 fL (ref 80–100)
MONO ABS: 0.3 x10 3/mm (ref 0.2–1.0)
Monocyte %: 5.2 %
NEUTROS ABS: 2.8 10*3/uL (ref 1.4–6.5)
Neutrophil %: 57.4 %
PLATELETS: 199 10*3/uL (ref 150–440)
RBC: 5.03 10*6/uL (ref 4.40–5.90)
RDW: 15 % — ABNORMAL HIGH (ref 11.5–14.5)
WBC: 4.9 10*3/uL (ref 3.8–10.6)

## 2014-04-29 ENCOUNTER — Ambulatory Visit: Payer: Self-pay | Admitting: Oncology

## 2014-07-13 NOTE — Op Note (Signed)
PATIENT NAME:  Randy [name], Randy [name] MR#:  [id number] DATE OF BIRTH:  [birth year]  DATE OF PROCEDURE:  03/11/2012  PREOPERATIVE DIAGNOSIS: Hodgkin's lymphoma.   POSTOPERATIVE DIAGNOSIS: Hodgkin's lymphoma.   PROCEDURE PERFORMED: Insertion of right IJ Infuse-a-Port with fluoroscopic and ultrasound guidance.   SURGEON: Hortencia Pilar, M.D.   SEDATION: Versed 4 mg plus fentanyl 100 mcg administered IV. Continuous ECG, pulse oximetry and cardiopulmonary monitoring was performed throughout the entire procedure by the interventional radiologist. Total sedation time is 30 minutes.   ACCESS: Right IJ.   CONTRAST USED: None.   FLUORO TIME: 0.3 minutes.   INDICATIONS: [name] is a 44 year old gentleman who is currently undergoing treatment for Hodgkin's lymphoma, stage II. He therefore is requiring chemotherapy and appropriate IV access. Risks and benefits for Infuse-a-Port placement were reviewed. The patient agrees to proceed.   DESCRIPTION OF PROCEDURE: The patient is taken to special procedures and placed in the supine position. After adequate sedation is achieved, he is positioned supine with his neck slightly extended and rotated to the left. Neck and chest wall are prepped and draped in sterile fashion. 1% lidocaine is infiltrated in the soft tissues. Ultrasound is placed in a sterile sleeve. Jugular vein is identified. It is echolucent and compressible indicating patency. Image is recorded and under direct visualization a Seldinger needle is inserted into the jugular vein and J-wire is advanced under fluoroscopic guidance without difficulty. Dilator and peel-away sheath are placed and counterincision is created at the wire insertion site. A linear incision is then made two fingerbreadths below the clavicle and a pocket fashioned using both blunt and sharp dissection, it is checked for appropriate size and subsequently the catheter is tunneled subcutaneously. Catheter is then advanced through the  peel-away sheath and the peel-away sheath is removed. The catheter tip is then adjusted for appropriate position under fluoroscopic guidance, catheter is transected, the hub is connected and slipped into the subcutaneous pocket. It is then accessed with a Huber needle percutaneously and it aspirates easily and flushes well. The pocket incision is then closed using interrupted 3-0 Vicryl followed by 4-0 Monocryl subcuticular and Dermabond. The neck counterincision is closed with 4-0 Monocryl and Dermabond. The patient tolerated the procedure well. There were no immediate complications. Sponge and needle counts are correct and he is taken to the recovery area in excellent condition.  ____________________________ Katha Cabal, MD ggs:sb D: 03/11/2012 14:39:42 ET T: 03/12/2012 08:45:34 ET JOB#: [id number]  cc: Katha Cabal, MD, <Dictator> Kathlene November. Grayland Ormond, [place] MD ELECTRONICALLY SIGNED 03/12/2012 16:24

## 2014-07-13 NOTE — Op Note (Signed)
PATIENT NAME:  Randy [name], Randy [name] MR#:  [id number] DATE OF BIRTH:  01/13/1971  DATE OF PROCEDURE:  01/10/2012  PREOPERATIVE DIAGNOSIS: Right axillary lymphadenopathy.   POSTOPERATIVE DIAGNOSIS: Right axillary lymphadenopathy.   PROCEDURE PERFORMED: Right axillary lymph node excisional biopsy x3 nodes.  SURGEON: Marlyce Huge, MD  ANESTHESIA: General.  ESTIMATED BLOOD LOSS: 25 mL.   SPECIMENS:  1. Lymph node #1  is an approximately 1 x 1 cm lymph node.  2. Piecemeal excision of large right axillary palpable mass, labeled #2 3. Probable lymph node, possible subcutaneous tissue, superficial, labeled #3  INDICATION FOR SURGERY: [name] is a pleasant 44 year old male with history of right axillary mass who on CT scan had multiple axillary lymph nodes. He went to the operating room for excisional biopsy of tissue for diagnosis.   DETAILS OF PROCEDURE: The patient was seen as an outpatient and informed was obtained. He was brought to the operating room suite. He was induced, LMA was placed, and general anesthesia was administered. His right axilla was then prepped and draped in standard surgical fashion. A transverse incision was made over the palpable mass. This was deepened through subcutaneous tissue into the palpable mass. The palpable mass was then circumferentially excised, but was noted to go very deep into the axilla as well as close to the chest wall. I attempted to bring it superficially with a Babcock and it was very friable and broke into multiple pieces so I was not able to excise it completely.  I thought that potential for collateral injury in such an inflamed environment after I had obtained adequate tissue made complete excision not worth it. I did find a small 1 cm lymph node which was posterior to the large mass which I sent for pathology as lymph #1. The piecemeal excision was lymph node #2. I also had a more superficial area of probable lymphatic tissue cephalad to the main  madd which I took a nodal specimen. I then obtained hemostasis with electrocautery. I irrigated the wound with sterile water and when the wound was hemostatic I closed it in three layers. I closed the deltopectoral fascia with a 3-0 Vicryl running suture. I then did a dermal running 3-0 Vicryl layer then a 4-0 Monocryl running subcuticular layer. Dermabond was then placed over the wound. I elected not to place a drain due to the fact the patient has a history of schizophrenia and would likely pull a drain and potentially cause bleeding from that. He was then awoken and taken to the postanesthesia care unit. There were no immediate complications. Needle, sponge, and instrument counts were correct at the end of the procedure.  ____________________________ Glena Norfolk. Montanna Mcbain, MD cal:slb D: 01/10/2012 13:23:10 ET T: 01/10/2012 13:47:44 ET JOB#: [id number]  cc: Harrell Gave A. Nyeem Stoke, MD, <Dictator> Floyde Parkins MD ELECTRONICALLY SIGNED 01/13/2012 10:03
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
## 2014-07-16 NOTE — Consult Note (Signed)
Reason for Visit: This Randy [name] patient presents to the clinic for initial evaluation of  Hodgkin's lymphoma stage IIB .   Referred by Dr. Grayland Ormond.  Diagnosis:  Chief Complaint/Diagnosis   Randy [name] with bipolar disorder and stage IIB Hodgkin's lymphoma status post 3 cycles of ABVD chemotherapy with complete response by PET CT criteria  Pathology Report pathologic report reviewed   Imaging Report serial PET/CT scan is reviewed   Referral Report clinical notes reviewed   HPI   patient is a Randy [name] who presented in June of 2013 with some tenderness and pain in his right axilla. Wife noted a mass in that area empirically treated for infection initially. Workup showed to havesignificant right axillary or supraclavicular adenopathy both areas PET avid. Patient underwent excisional biopsy by Dr. Adam Phenix for classic Hodgkin's disease. There was one focus of sclerosis in the specimen which may have been a small area of sclerosing Hodgkin's disease. On questioning patient did have some night sweats and chills prior to his diagnosis. No significant weight loss no skin itching. He has done well with his chemotherapy. After 63 cycles of ABVD chemotherapy by PET CT criteria he had a complete response. There is a small slightly avid inguinal node on his recent PET/CT scan although I believe that is not clinically significant. He is seen today for consideration of involved field radiation. He is having no dysphasia at this time. Still complains of chest pain does occasionally take Percocet for that. He is also having some occasional intermittent nausea and I have refilled his Zofran for that.  Past Hx:    Tremors:    Schizophrenia:    Panic Attacks:    Anxiety:    Lymphoma:    COPD:    Bipolar Disorder:    back surgery:    left hand surgery:   Past, Family and Social History:  Past Medical History positive   Respiratory COPD   Neurological/Psychiatric anxiety;  bipolar disorder, schizophrenia, panic attacks, tremors   Past Surgical History left hand surgery, back surgery   Family History positive   Family History Comments family history positive for brain cancer, colon cancer and rectal cancer   Social History positive   Social History Comments 30-pack-year smoking history continues to smoke. No EtOH abuse history   Additional Past Medical and Surgical History accompanied by his wife today   Allergies:   Penicillin: Unknown  Home Meds:  Home Medications: Medication Instructions Status  ondansetron 4 mg oral tablet 1 tab(s) orally every 8 hours, As Needed- for Nausea, Vomiting  Active  Percocet 5/325 1 tab(s) orally every 6 hours, As Needed- for Pain  Active  acetaminophen-oxyCODONE 325 mg-10 mg oral tablet 1 tab(s) orally 3 times a day as needed for pain. Active  nystatin 100000 units/mL oral suspension 5 mL orally 4 times a day swish and swallow Active  hydrOXYzine hydrochloride 10 mg oral tablet 1 tab(s) orally every 8 hours, As Needed- for Itching  Active  Dukes mouthwash 5 mL  4 times a day Active  Compazine tablet 10 mg 1 tab(s) orally every 6 hours x 30 days as needed   Active  M[place]itius 234mg  injection 1   once a month Active  trazodone 100 mg oral tablet 3 tab(s) orally once a day (at bedtime) Active  Eskalith-CR 450mg  (Lithium) 1 tab(s)  in am, 2 tabs at 4:00PM Active   Review of Systems:  General negative   Performance Status (ECOG) 1   Skin  negative   Breast negative   Ophthalmologic negative   ENMT negative   Respiratory and Thorax negative   Cardiovascular negative   Gastrointestinal negative   Genitourinary negative   Musculoskeletal negative   Neurological negative   Psychiatric see HPI   Hematology/Lymphatics see HPI   Endocrine negative   Allergic/Immunologic negative   Review of Systems   except for some intermittent pain his chest and occasional intermittent nausea according to  nurse's notesPatient denies any weight loss, fatigue, weakness, fever, chills or night sweats. Patient denies any loss of vision, blurred vision. Patient denies any ringing  of the ears or hearing loss. No irregular heartbeat. Patient denies heart murmur or history of fainting. Patient denies any chest pain or pain radiating to her upper extremities. Patient denies any shortness of breath, difficulty breathing at night, cough or hemoptysis. Patient denies any swelling in the lower legs. Patient denies any nausea vomiting, vomiting of blood, or coffee ground material in the vomitus. Patient denies any stomach pain. Patient states has had normal bowel movements no significant constipation or diarrhea. Patient denies any dysuria, hematuria or significant nocturia. Patient denies any problems walking, swelling in the joints or loss of balance. Patient denies any skin changes, loss of hair or loss of weight. Patient denies any excessive worrying or anxiety or significant depression. Patient denies any problems with insomnia. Patient denies excessive thirst, polyuria, polydipsia. Patient denies any swollen glands, patient denies easy bruising or easy bleeding. Patient denies any recent infections, allergies or URI. Patient "s visual fields have not changed significantly in recent time.  Nursing Notes:  Nursing Vital Signs and Chemo Nursing Nursing Notes: *CC Vital Signs Flowsheet:   19-Mar-14 14:48  Temp Temperature 98.1  Pulse Pulse 96  Respirations Respirations 20  SBP SBP 137  DBP DBP 81  Pain Scale (0-10)  7-9  Current Weight (kg) (kg) 105.2  Height (cm) centimeters 176  BSA (m2) 2.2   Physical Exam:  General/Skin/HEENT:  General normal   Skin normal   Eyes normal   ENMT normal   Head and Neck normal   Additional PE well-developed well-nourished [name] in NAD. Oral cavity is clear no oral mucosal lesions are identified. Neck is clear without evidence of some digastric cervical or  supraclavicular adenopathy. No axillary adenopathy is noted bilaterally. Lungs are clear to A&P cardiac examination shows regular rate and rhythm. Abdomen is benign with no organomegaly or masses noted. No peripheral adenopathy or edema is identified.   Breasts/Resp/CV/GI/GU:  Respiratory and Thorax normal   Cardiovascular normal   Gastrointestinal normal   Genitourinary normal   MS/Neuro/Psych/Lymph:  Musculoskeletal normal   Neurological normal   Lymphatics normal   Other Results:  Radiology Results: LabUnknown:    20-Feb-14 14:05, PET/CT Scan Lymphoma Restaging  PACS Image   CT:    11-Oct-13 10:39, CT Abdomen and Pelvis With Contrast  CT Abdomen and Pelvis With Contrast   REASON FOR EXAM:    LYMPHADENPATHY  COMMENTS:       PROCEDURE: MCT - MCT ABDOMEN / PELVIS W  - Jan 04 2012 10:39AM     RESULT: Comparison:  CT of the abdomen and pelvis 05/06/2006    Technique: Multiple axial images of the abdomen and pelvis were performed   from the lung bases to the pubic symphysis, with p.o. contrast and with   100 mL of Isovue 370 intravenous contrast.    Findings:  Mild basilar opacities are likely secondary to atelectasis.  The liver is slightly low in attenuation, raisingthe possibility of     hepatic steatosis. The gallbladder is relatively decompressed. The   spleen, adrenals, and pancreas are unremarkable. Small low-attenuation   focus in the right kidney is too small to characterize. There are several   subcentimeter retroperitoneal lymph nodes, none of which are   pathologically enlarged. No mesenteric lymphadenopathy. There is a mildly   enlarged node in the porta hepatis which measures 2.1 x 1.3 cm. This is   nonspecific.    The small and large bowel are normal in caliber. The appendix is normal.   No inguinal lymphadenopathy.    No aggressive lytic or sclerotic osseous lesions are identified. There is   some sclerosis and lucency in the bilateral femoral  heads. This could be   related to changes of avascular necrosis.    There are enlarged right axillary lymph nodes, incompletely visualized.   The visualized lymph node measures 2.2 x 2.0 cm.    IMPRESSION:   1. There is a mildly enlarged lymph node in the porta hepatis, which is   nonspecific. No lymphadenopathy in the remainder of the abdomen or pelvis.  2. Enlarged right axillary lymph nodes, incompletely visualized. These   are better visualized on the recent prior chest CT.  3. There is mild sclerosis and lucency in the bilateral femoral heads.   This may be related to changes of avascular necrosis. Further elevation   could be further with MRI, as clinically indicated.          Verified By: Gregor Hams, M.D., MD    11-Oct-13 10:39, CT Neck With Contrast  CT Neck With Contrast   REASON FOR EXAM:    LYMPHADENPATHY  COMMENTS:       PROCEDURE: MCT - MCT NECK WITH CONTRAST  - Jan 04 2012 10:39AM     RESULT: Comparison: CT of the chest 12/31/2011    Technique: Multiple axial images were obtained of the neck from the level   of thefrontal sinuses through the aortic arch, after the administration   of 100 mL Isovue-370.    Findings:  There is an enlarged right infraclavicular lymph node which measures 3.3   x 2.4 cm. There are multiple enlarged right subpectoral and right   axillary lymph nodes which are incompletely visualized. The largest right     subpectoral lymph node measures 3.0 x 2.0 cm. No lymphadenopathy   identified within the remainder of the neck. The airway is patent.    Small mucus retention cysts are seen inthe bilateral maxillary sinuses.   No aggressive lytic or sclerotic osseous lesions are identified.    IMPRESSION:   1. Multiple enlarged right infraclavicular, right subpectoral, and right   axillar lymph nodes. These are nonspecific, but given their size,   lymphoma is of differential consideration.  2. Otherwise, no lymphadenopathy in the remainder  of the neck.          Verified By: Gregor Hams, M.D., MD  Nuclear Med:    [phone] 11:54, PET/CT Scan Lymphoma Initial Staging  PET/CT Scan Lymphoma Initial Staging   REASON FOR EXAM:    lymphoma staging  COMMENTS:       PROCEDURE: PET - PET/CT Eduardo Osier LYMPHOMA  - Jan 28 2012 11:54AM     RESULT: The patient is undergoing initial staging of lymphoma diagnosed   through right axillary lymph node biopsy. The patient's fasting blood   glucose level was 104 mg/deciliter.  The patient received 12.85 mCi of   F-18 labeled FDG at 10:18 a.m. with scanning beginning at 11:19 a.m. A   noncontrast CT scan was performed at the same sitting for coregistration   and attenuation correction.    Uptake of the radiopharmaceutical within the neck is normal. There are   FDG avid lymph node masses in the right axilla and in the supra and   infraclavicular regions on the right. The dominant bulky nodal mass in     the right axilla exhibits maximal SUV of 24.2 with a mean of 14.0. A   dominant medial supraclavicular nodal mass exhibits maximal SUV of 12.7   with a mean of 7.8. There is a focus of increased uptake along the   posterior subcutaneous soft tissues of the right upper arm with maximal   SUV of 4.9 with a mean of 2.9. This corresponds to subtle soft tissue   density which may reflect an unenlarged lymph node.    I do not see abnormal uptake within the lung parenchyma nor within the   mediastinum. There is diffusely increased low level uptake in the right   paravertebral soft tissues. A discrete soft tissue mass here is not   demonstrated. There are prominent anterior osteophytes, but significant   lateral osteophyte formation is not demonstrated. The vertebral bodies do   not appear significantly sclerotic and no lytic lesions are demonstrated.    Within the abdomen there is a normal retrocrural lymph node exhibiting     SUV of 6.4 with a mean of 3.8. There is normal expected  uptake within the   kidneys and ureters and urinary bladder. There is a focus of increased   uptake associated with the posterior aspect of the left femoral head   which is nonspecific and may be related to impaction rather than to   malignancy. This exhibits maximal SUV of 7 with a mean of 4.4. There is a   small focus of increased uptake in the left inguinal region with maximal   SUV of 3.8 with a mean of 2. This corresponds to a subcentimeter lymph   node.    The CT images reveal bulky lymph nodes measuring up to 3 to4 cm in   diameter in the right axilla with enlarged super and infraclavicular   lymph nodes on the right. I do not see bulky mediastinal or hilar lymph   nodes. There is no pleural nor pericardial effusion nor abnormal   paravertebral soft tissue density masses. The thyroid lobes are normal in   density and symmetric in size. Within the abdomen no gallstones are     evident. There are no adrenal masses. The unopacified loops of small and   large bowel are normal in appearance.    IMPRESSION:    1. There is bulky FDG avid lymphadenopathy in the right axilla and in the   super infraclavicular regions on the right. There is an enlarged   subcutaneous lymph node in the posterior aspect of the upper arm on the   right.  2. There is an unenlarged but FDG avid retrocrural lymph node in the   midline.  3. There is increased uptake associated with the posterior aspect of the   left femoral head without CT abnormality.  4. There is a normal sized FDG avid left inguinal lymph node.     Dictation Site: 1    Verified By: DAVID A. Martinique, M.D., MD    20-Feb-14 14:05, PET/CT  Scan Lymphoma Restaging  PET/CT Scan Lymphoma Restaging   REASON FOR EXAM:    Hodgkins Lymphoma  COMMENTS:       PROCEDURE: PET - PET/CT RESTAGING LYMPHOMA  - May 15 2012  2:05PM     RESULT:     Procedure:  Total Body PET was performed in conjunction with   nonattenuated CT.    Comparison:  01/28/2012    Radiopharmaceutical: 12.9 mCi of F-18 fluorodeoxyglucose     Injection site: Left hand utilizing 22 gauge needle  Injection time: 12:40 p.m.    Scan start time: 1:40 p.m.    Scan end time: 2:05 p.m.    Fasting blood glucose is 87 mg/dl    Findings: Expected biodistribution is appreciated in the region of the   base of the brain, heart, liver, spleen, kidneys, urinary bladder and   bowel.    Chest: There does not appear to be evidence of abnormal foci of   hypermetabolic activity identified.  Abdomen and pelvis: There are no areas of abnormal hypermetabolic   activity appreciated.    The previously described areas of bulky FDG avid lymphadenopathy within   the right axilla and in the supraclavicular region have resolved in the   interim consistent with response to therapy. The FDG avid retrocrural   nodes have also resolved in the interim. There does not appear to be   persistent activity within the posterior aspect of the left femoral head.    IMPRESSION:  Interval resolution of the previouslydescribed areas of FDG   avid hypermetabolic activity as described above.      Thank you for this opportunity to contribute to the care of your patient.     Verified By: Mikki Santee, M.D., MD   Relevent Results:   Relevant Scans and Labs CT scan and PET CT scans are all reviewed   Assessment and Plan: Impression:   Randy [name] with stage IIb classic Hodgkin's status post 3 cycles of ABVD chemotherapy with complete response by PET CT criteria Plan:   at the stomach to go ahead with involved field radiation therapy. I would treat the mantle 2000 cGy and then boost his right supraclavicular right axillary region another thousand cGy bringing total dose to that region to the exact involved field to 3000 cGy. Risks and benefits of treatment including possible dysphagia, skin reaction, alteration of blood counts, some inclusion of normal lung were all explained to the  patient and his wife. They both seem to comprehend my treatment plan well. I have set him up for CT simulation next week. Zofran and Percocet prescriptions were refilled today.  I would like to take this opportunity to thank you for allowing me to continue to participate in this patient's care.  CC Referral:  cc: Dr. Loyal Buba   Electronic Signatures: Baruch Gouty, Roda Shutters (MD)  (Signed 19-Mar-14 15:28)  Authored: HPI, Diagnosis, Past Hx, PFSH, Allergies, Home Meds, ROS, Nursing Notes, Physical Exam, Other Results, Relevent Results, Encounter Assessment and Plan, CC Referring Physician   Last Updated: 19-Mar-14 15:28 by Armstead Peaks (MD)

## 2014-07-17 NOTE — Op Note (Signed)
PATIENT NAME:  Randy [name], MOURE MR#:  [id number] DATE OF BIRTH:  03/27/1970  DATE OF PROCEDURE:  11/17/2013  PREOPERATIVE DIAGNOSES: 1.  Hodgkin lymphoma status post chemotherapy.  2.  Complication of dialysis device with nonfunction of port.   POSTOPERATIVE DIAGNOSES:  1.  Hodgkin lymphoma status post chemotherapy.  2.  Complication of dialysis device with nonfunction of port.   PROCEDURE PERFORMED: Removal of Infuse-a-Port.   PROCEDURE PERFORMED BY: Katha Cabal, MD  SEDATION: Versed 5 mg plus fentanyl 200 mcg administered IV. Continuous ECG, pulse oximetry, and cardiopulmonary monitoring was performed throughout the entire procedure by the interventional radiology nurse. Total sedation time was 30 minutes.   ACCESS: None.   CONTRAST USED: None.   FLUOROSCOPY TIME: None.   INDICATIONS: Mr. [name] is a 44 year old gentleman who has completed his chemotherapy for his non-Hodgkin lymphoma and is now undergoing removal of his port. Risks and benefits are reviewed. All questions answered. Patient agrees to proceed.   DESCRIPTION OF PROCEDURE: The patient is taken to special procedures. He is placed in the supine position. After adequate sedation is achieved, his right neck and chest wall are prepped and draped in a sterile fashion. Appropriate timeout is called.   Lidocaine 1% is infiltrated in the soft tissues around the port. An 11 blade is used to open the previous incisional scar. Dissection is carried down. Port is isolated. It is then removed from the pocket. Light pressure is held at the base of the neck. Vicryl 3-0 is used to close the subcutaneous tissues with interrupted figure-of-eight sutures, and the skin is closed with 4-0 Monocryl subcuticular and Dermabond. The patient tolerated the procedure well and there were no immediate complications.    ____________________________ Katha Cabal, MD ggs:ST D: 11/17/2013 14:51:21 ET T: 11/17/2013 22:23:50  ET JOB#: [id number]  cc: Katha Cabal, MD, <Dictator> Katha Cabal MD ELECTRONICALLY SIGNED 12/01/2013 22:38

## 2014-07-17 NOTE — Consult Note (Signed)
PATIENT NAME:  Randy [name], Randy [name] MR#:  [id number] DATE OF BIRTH:  April 23, 1970  DATE OF CONSULTATION:  12/01/2013  REFERRING PHYSICIAN:  Emergency Room  CONSULTING PHYSICIAN:  Gonzella Lex, MD  IDENTIFYING INFORMATION AND REASON FOR CONSULT:  This is a 44 year old gentleman with a history of chronic mental illness, brought into the hospital by his family after he took an overdose with suicidal intent. Consultation for appropriate treatment.   HISTORY OF PRESENT ILLNESS: Information obtained from the patient and the chart. The patient tells me that he has been feeling more and more angry and depressed recently. He says he is tired of his wife "cussing" at him and ridiculing him. He vaguely describes an event that happened over the weekend, in which he says that his wife cursed at him for moving some furniture around. He felt hopeless and depressed, and took an overdose. His overdose consisted of 5 Tylenol tablets, some kind of medicine belonging to his daughter that he is not aware of. He then called his daughter, who had him brought to the hospital.   The patient reports his mood stays depressed and irritable, and has been getting worse for months. He has been off his medication for probably most of the year. He has chronic conflict with some of his family that has required him to move away from his wife and live with his daughter. He denies that he is using drugs or alcohol. Currently, he is endorsing auditory hallucinations with commands to them. No visual hallucinations. He has some paranoia. Some of his description of his relationship with his wife might be paranoid; it is kind of hard to determine right now. He is sleeping very poorly at night. His appetite is poor. He has had suicidal ideation for some days.   PAST PSYCHIATRIC HISTORY: The patient has a history of psychotic disorder, possible bipolar versus schizoaffective disorder. He was last in the hospital about 4 years ago here at our hospital  after his last suicide attempt. He was being seen by Dr. Sammuel Cooper as an outpatient, but he was "dropped" several months ago. I suspect this is because they change their Medicare policy as this happened to many patients in the area. Since then, he has had no medication treatment. Positive past history of more than 1 hospitalization. Positive history of suicidality. He was being treated with [facility] as an outpatient. Both he and his wife report that his insurance company would no longer pay for this and so he needs to be on something else. He has been on lithium and Haldol decanoate previously at our hospital.   PAST MEDICAL HISTORY: The patient has no significant ongoing medical problems. No history of MI, hypertension or diabetes.   FAMILY HISTORY: No family history of mental illness.   SOCIAL HISTORY: Disabled. Lives with his older daughter. He and his wife are still married, but they do not live together. From what I can gather from a brief conversation with his wife, it sounds like she does not take much interest in his day-to-day activities and feels fed up with it. It looks like in the past he has displayed dangerous and threatening behavior towards his wife when he was psychotic.   CURRENT MEDICATIONS: None.   ALLERGIES: Penicillin.   REVIEW OF SYSTEMS: Suicidality, depressed mood, labile mood, anger, poor sleep. Auditory hallucinations. Denies any cardiac, pulmonary, or GI complaints. The rest of the full 10-point review of systems is negative.   MENTAL STATUS EXAMINATION: Disheveled, somewhat oddly  groomed gentleman, who looks his stated age. Cooperative with the interview. Good eye contact. Psychomotor activity normal. Speech is a little bit telegraphic with some thought blocking, decreased in amount, easy to understand. Affect blunted. Mood stated as depressed. Thoughts are a bit rambling and disorganized. Hard to tell how much of what he is saying is paranoid. He endorses auditory  hallucinations with commands. Denies visual hallucinations. Endorses passive suicidal thoughts. No homicidal ideation. No acute suicidal intent. He could recall 3 out of 3 objects immediately and at 3 minutes. He was alert and oriented x 4. Normal fund of knowledge. Judgment and insight probably adequate within the context.   VITAL SIGNS: His blood pressure most recently was 104/70, respirations 20, pulse 70, temperature 98.6.   LABORATORY RESULTS: Acetaminophen was actually very low at 2. Salicylates negative. Acetaminophen earlier was also low. No sign of acetaminophen overdose. BUN elevated at 20, glucose slightly elevated at 105, the rest of the chemistry panel normal. Alcohol level negative. Drug screen all negative. CBC unremarkable. The portable chest x-ray is unremarkable, except for low lung volumes, some patchy density, possibly radiation change.   ASSESSMENT: A 44 year old man with schizoaffective disorder or bipolar disorder, presents after a suicide attempt. He is paranoid, having hallucinations, psychotic, has a history of dangerous behavior when psychotic in the past. Medically is stable. The patient requires hospitalization because of suicidality and psychotic symptoms.   TREATMENT PLAN: Admit to psychiatry. Suicide close and elopement precautions. I put him back on a modest dose of Haldol standing, as well as p.r.n. Ativan. Primary treatment team on the unit can decide on other medication.   DIAGNOSIS, PRINCIPAL AND PRIMARY:  AXIS I: Schizoaffective disorder, mixed.   SECONDARY DIAGNOSES:  AXIS I: No further.  AXIS II: Deferred.  AXIS III: Status post overdose with no evidence sequela.     ____________________________ Gonzella Lex, MD jtc:MT D: 12/01/2013 13:14:34 ET T: 12/01/2013 13:29:15 ET JOB#: [id number]  cc: Gonzella Lex, MD, <Dictator> Gonzella Lex MD ELECTRONICALLY SIGNED 12/05/2013 22:23

## 2014-07-17 NOTE — Consult Note (Signed)
Brief Consult Note: Diagnosis: schizoaffeective disorder.   Patient was seen by consultant.   Consult note dictated.   Recommend further assessment or treatment.   Orders entered.   Comments: Psychiatry: PAtient seen and chart reviewed. Patient made suicide attempt and continues to be very depressed with passive suicidal ideation. Admit to [facility]. On precautions. Orders done.  Electronic Signatures: Gonzella Lex (MD)  (Signed 08-Sep-15 13:06)  Authored: Brief Consult Note   Last Updated: 08-Sep-15 13:06 by Gonzella Lex (MD)

## 2014-07-22 DIAGNOSIS — F209 Schizophrenia, unspecified: Secondary | ICD-10-CM | POA: Diagnosis not present

## 2014-07-22 DIAGNOSIS — F203 Undifferentiated schizophrenia: Secondary | ICD-10-CM | POA: Diagnosis not present

## 2014-07-22 DIAGNOSIS — Q860 Fetal alcohol syndrome (dysmorphic): Secondary | ICD-10-CM | POA: Diagnosis not present

## 2014-07-22 DIAGNOSIS — F331 Major depressive disorder, recurrent, moderate: Secondary | ICD-10-CM | POA: Diagnosis not present
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
## 2014-08-05 DIAGNOSIS — F203 Undifferentiated schizophrenia: Secondary | ICD-10-CM | POA: Diagnosis not present

## 2014-08-05 DIAGNOSIS — Q860 Fetal alcohol syndrome (dysmorphic): Secondary | ICD-10-CM | POA: Diagnosis not present

## 2014-08-05 DIAGNOSIS — F331 Major depressive disorder, recurrent, moderate: Secondary | ICD-10-CM | POA: Diagnosis not present

## 2014-08-26 DIAGNOSIS — F331 Major depressive disorder, recurrent, moderate: Secondary | ICD-10-CM | POA: Diagnosis not present

## 2014-08-26 DIAGNOSIS — Q860 Fetal alcohol syndrome (dysmorphic): Secondary | ICD-10-CM | POA: Diagnosis not present

## 2014-08-26 DIAGNOSIS — F203 Undifferentiated schizophrenia: Secondary | ICD-10-CM | POA: Diagnosis not present

## 2014-10-14 DIAGNOSIS — F203 Undifferentiated schizophrenia: Secondary | ICD-10-CM | POA: Diagnosis not present

## 2014-10-14 DIAGNOSIS — Q860 Fetal alcohol syndrome (dysmorphic): Secondary | ICD-10-CM | POA: Diagnosis not present

## 2014-10-14 DIAGNOSIS — Z5181 Encounter for therapeutic drug level monitoring: Secondary | ICD-10-CM | POA: Diagnosis not present

## 2014-10-14 DIAGNOSIS — F209 Schizophrenia, unspecified: Secondary | ICD-10-CM | POA: Diagnosis not present

## 2014-10-14 DIAGNOSIS — F331 Major depressive disorder, recurrent, moderate: Secondary | ICD-10-CM | POA: Diagnosis not present

## 2014-11-15 DIAGNOSIS — C8590 Non-Hodgkin lymphoma, unspecified, unspecified site: Secondary | ICD-10-CM | POA: Diagnosis not present

## 2014-11-15 DIAGNOSIS — Z79899 Other long term (current) drug therapy: Secondary | ICD-10-CM | POA: Diagnosis not present

## 2014-11-15 DIAGNOSIS — B029 Zoster without complications: Secondary | ICD-10-CM | POA: Diagnosis not present

## 2014-11-15 DIAGNOSIS — F209 Schizophrenia, unspecified: Secondary | ICD-10-CM | POA: Diagnosis not present

## 2014-11-15 DIAGNOSIS — F319 Bipolar disorder, unspecified: Secondary | ICD-10-CM | POA: Diagnosis not present

## 2014-11-15 DIAGNOSIS — Q860 Fetal alcohol syndrome (dysmorphic): Secondary | ICD-10-CM | POA: Diagnosis not present

## 2014-11-18 DIAGNOSIS — F3164 Bipolar disorder, current episode mixed, severe, with psychotic features: Secondary | ICD-10-CM | POA: Diagnosis not present

## 2014-11-18 DIAGNOSIS — F203 Undifferentiated schizophrenia: Secondary | ICD-10-CM | POA: Diagnosis not present

## 2014-11-18 DIAGNOSIS — Q860 Fetal alcohol syndrome (dysmorphic): Secondary | ICD-10-CM | POA: Diagnosis not present

## 2014-11-21 DIAGNOSIS — F209 Schizophrenia, unspecified: Secondary | ICD-10-CM | POA: Diagnosis not present

## 2014-11-21 DIAGNOSIS — Z720 Tobacco use: Secondary | ICD-10-CM | POA: Diagnosis not present

## 2014-11-21 DIAGNOSIS — R21 Rash and other nonspecific skin eruption: Secondary | ICD-10-CM | POA: Diagnosis not present

## 2014-11-21 DIAGNOSIS — L309 Dermatitis, unspecified: Secondary | ICD-10-CM | POA: Diagnosis not present

## 2014-11-21 DIAGNOSIS — F319 Bipolar disorder, unspecified: Secondary | ICD-10-CM | POA: Diagnosis not present

## 2014-11-21 DIAGNOSIS — R05 Cough: Secondary | ICD-10-CM | POA: Diagnosis not present

## 2014-11-21 DIAGNOSIS — Z79899 Other long term (current) drug therapy: Secondary | ICD-10-CM | POA: Diagnosis not present

## 2014-11-21 DIAGNOSIS — C8590 Non-Hodgkin lymphoma, unspecified, unspecified site: Secondary | ICD-10-CM | POA: Diagnosis not present

## 2014-12-16 DIAGNOSIS — Z5181 Encounter for therapeutic drug level monitoring: Secondary | ICD-10-CM | POA: Diagnosis not present

## 2014-12-16 DIAGNOSIS — Z79899 Other long term (current) drug therapy: Secondary | ICD-10-CM | POA: Diagnosis not present

## 2015-01-06 DIAGNOSIS — F209 Schizophrenia, unspecified: Secondary | ICD-10-CM | POA: Diagnosis not present

## 2015-01-06 DIAGNOSIS — Z5181 Encounter for therapeutic drug level monitoring: Secondary | ICD-10-CM | POA: Diagnosis not present

## 2015-01-06 DIAGNOSIS — F3177 Bipolar disorder, in partial remission, most recent episode mixed: Secondary | ICD-10-CM | POA: Diagnosis not present

## 2015-06-16 DIAGNOSIS — F3341 Major depressive disorder, recurrent, in partial remission: Secondary | ICD-10-CM | POA: Diagnosis not present

## 2015-06-16 DIAGNOSIS — Z5181 Encounter for therapeutic drug level monitoring: Secondary | ICD-10-CM | POA: Diagnosis not present

## 2015-06-16 DIAGNOSIS — F209 Schizophrenia, unspecified: Secondary | ICD-10-CM | POA: Diagnosis not present

## 2015-08-18 DIAGNOSIS — F209 Schizophrenia, unspecified: Secondary | ICD-10-CM | POA: Diagnosis not present

## 2015-08-18 DIAGNOSIS — F3341 Major depressive disorder, recurrent, in partial remission: Secondary | ICD-10-CM | POA: Diagnosis not present

## 2015-09-15 DIAGNOSIS — Z5181 Encounter for therapeutic drug level monitoring: Secondary | ICD-10-CM | POA: Diagnosis not present

## 2015-09-15 DIAGNOSIS — F209 Schizophrenia, unspecified: Secondary | ICD-10-CM | POA: Diagnosis not present

## 2015-09-15 DIAGNOSIS — Z79899 Other long term (current) drug therapy: Secondary | ICD-10-CM | POA: Diagnosis not present

## 2015-09-15 DIAGNOSIS — F3341 Major depressive disorder, recurrent, in partial remission: Secondary | ICD-10-CM | POA: Diagnosis not present

## 2015-12-07 DIAGNOSIS — F3341 Major depressive disorder, recurrent, in partial remission: Secondary | ICD-10-CM | POA: Diagnosis not present

## 2015-12-07 DIAGNOSIS — F209 Schizophrenia, unspecified: Secondary | ICD-10-CM | POA: Diagnosis not present

## 2016-02-23 ENCOUNTER — Emergency Department
Admission: EM | Admit: 2016-02-23 | Discharge: 2016-02-23 | Disposition: A | Discharge status: Home / Self Care | Payer: Commercial Managed Care - HMO | Attending: Emergency Medicine | Admitting: Emergency Medicine

## 2016-02-23 ENCOUNTER — Encounter: Payer: Self-pay | Admitting: Emergency Medicine

## 2016-02-23 DIAGNOSIS — F17200 Nicotine dependence, unspecified, uncomplicated: Secondary | ICD-10-CM | POA: Insufficient documentation

## 2016-02-23 DIAGNOSIS — B349 Viral infection, unspecified: Secondary | ICD-10-CM | POA: Diagnosis not present

## 2016-02-23 DIAGNOSIS — R05 Cough: Secondary | ICD-10-CM | POA: Diagnosis present

## 2016-02-23 HISTORY — DX: Bipolar disorder, unspecified: F31.9

## 2016-02-23 MED ORDER — PSEUDOEPH-BROMPHEN-DM 30-2-10 MG/5ML PO SYRP: 10.0000 mL | ORAL_SOLUTION | Freq: Four times a day (QID) | ORAL | 0 refills | Status: DC | PRN

## 2016-02-23 MED ORDER — FLUTICASONE PROPIONATE 50 MCG/ACT NA SUSP: 1.0000 | Freq: Two times a day (BID) | NASAL | 0 refills | Status: DC

## 2016-02-23 MED ORDER — ONDANSETRON 4 MG PO TBDP: 4.0000 mg | ORAL_TABLET | Freq: Three times a day (TID) | ORAL | 0 refills | Status: DC | PRN

## 2016-02-23 MED ORDER — CETIRIZINE HCL 10 MG PO TABS: 10.0000 mg | ORAL_TABLET | Freq: Every day | ORAL | 0 refills | Status: DC

## 2016-02-23 NOTE — ED Provider Notes (Signed)
[facility] Emergency Department Provider Note  ____________________________________________  Time seen: Approximately 4:20 PM  I have reviewed the triage vital signs and the nursing notes.   HISTORY  Chief Complaint Fever; Cough; and Generalized Body Aches    HPI Randy [name] is a 45 y.o. male who presents to emergency department complaining of multiple complaints. Patient states his experienced nasal congestion, sore throat, coughing, fevers and chills, 3 episodes of emesis, 2-3 episodes of diarrhea over the past 4-5 days. Patient states that symptoms began insidiously. He been using Mucinex and Tylenol which helped somewhat but has not fully resolved his symptoms. Patient denies any headache, visual changes, neck pain, chest pain, shortness of breath, abdominal pain.   Past Medical History:  Diagnosis Date  . Bipolar 1 disorder ([place])     There are no active problems to display for this patient.   History reviewed. No pertinent surgical history.  Prior to Admission medications   Medication Sig Start Date End Date Taking? Authorizing Provider  brompheniramine-pseudoephedrine-DM 30-2-10 MG/5ML syrup Take 10 mLs by mouth 4 (four) times daily as needed. 02/23/16   Charline Bills Cuthriell, PA-C  cetirizine (ZYRTEC) 10 MG tablet Take 1 tablet (10 mg total) by mouth daily. 02/23/16   Charline Bills Cuthriell, PA-C  fluticasone (FLONASE) 50 MCG/ACT nasal spray Place 1 spray into both nostrils 2 (two) times daily. 02/23/16   Charline Bills Cuthriell, PA-C  ondansetron (ZOFRAN-ODT) 4 MG disintegrating tablet Take 1 tablet (4 mg total) by mouth every 8 (eight) hours as needed for nausea or vomiting. 02/23/16   Charline Bills Cuthriell, PA-C    Allergies Patient has no known allergies.  No family history on file.  Social History Social History  Substance Use Topics  . Smoking status: Never Smoker  . Smokeless tobacco: Current User  . Alcohol use No     Review of  Systems  Constitutional: No fever/chills Eyes: No visual changes. No discharge ENT: Positive for nasal congestion and sore throat. Cardiovascular: no chest pain. Respiratory: Positive for cough cough. No SOB. Gastrointestinal: No abdominal pain.  Positive for 3 episodes of emesis, patient describes as posttussive.  2-3 episodes diarrhea.  No constipation. Musculoskeletal: Negative for musculoskeletal pain. Skin: Negative for rash, abrasions, lacerations, ecchymosis. Neurological: Negative for headaches, focal weakness or numbness. 10-point ROS otherwise negative.  ____________________________________________   PHYSICAL EXAM:  VITAL SIGNS: ED Triage Vitals  Enc Vitals Group     BP 02/23/16 1548 (!) 141/88     Pulse Rate 02/23/16 1548 84     Resp 02/23/16 1548 20     Temp 02/23/16 1548 98.9 F (37.2 C)     Temp Source 02/23/16 1548 Oral     SpO2 02/23/16 1548 96 %     Weight 02/23/16 1549 235 lb (106.6 kg)     Height 02/23/16 1549 5\' 11"  (1.803 m)     Head Circumference --      Peak Flow --      Pain Score 02/23/16 1549 7     Pain Loc --      Pain Edu? --      Excl. in [place]? --      Constitutional: Alert and oriented. Well appearing and in no acute distress. Eyes: Conjunctivae are normal. PERRL. EOMI. Head: Atraumatic. ENT:      Ears: EACs and TMs unremarkable bilaterally.      Nose:  moderate congestion/rhinnorhea.      Mouth/Throat: Mucous membranes are moist. Oropharynx is nonerythematous  and nonedematous. Uvula is midline. Tonsils are unremarkable bilaterally. Neck: No stridor.   Hematological/Lymphatic/Immunilogical: No cervical lymphadenopathy. Cardiovascular: Normal rate, regular rhythm. Normal S1 and S2.  Good peripheral circulation. Respiratory: Normal respiratory effort without tachypnea or retractions. Lungs CTAB. Good air entry to the bases with no decreased or absent breath sounds. Gastrointestinal: Bowel sounds 4 quadrants. Soft and nontender to palpation.  No guarding or rigidity. No palpable masses. No distention.  Musculoskeletal: Full range of motion to all extremities. No gross deformities appreciated. Neurologic:  Normal speech and language. No gross focal neurologic deficits are appreciated.  Skin:  Skin is warm, dry and intact. No rash noted. Psychiatric: Mood and affect are normal. Speech and behavior are normal. Patient exhibits appropriate insight and judgement.   ____________________________________________   LABS (all labs ordered are listed, but only abnormal results are displayed)  Labs Reviewed - No data to display ____________________________________________  EKG   ____________________________________________  RADIOLOGY   No results found.  ____________________________________________    PROCEDURES  Procedure(s) performed:    Procedures    Medications - No data to display   ____________________________________________   INITIAL IMPRESSION / ASSESSMENT AND PLAN / ED COURSE  Pertinent labs & imaging results that were available during my care of the patient were reviewed by me and considered in my medical decision making (see chart for details).  Review of the [place] CSRS was performed in accordance of the [place] prior to dispensing any controlled drugs.  Clinical Course     Patient's diagnosis is consistent with A viral illness. Patient's exam is reassuring with no acute findings.. Patient will be discharged home with prescriptions for symptom control meds to include Flonase, Zyrtec, cough syrup, antinausea medication. Patient is to follow up with primary care as needed or otherwise directed. Patient is given ED precautions to return to the ED for any worsening or new symptoms.     ____________________________________________  FINAL CLINICAL IMPRESSION(S) / ED DIAGNOSES  Final diagnoses:  Viral illness      NEW MEDICATIONS STARTED DURING THIS VISIT:  New Prescriptions    BROMPHENIRAMINE-PSEUDOEPHEDRINE-DM 30-2-10 MG/5ML SYRUP    Take 10 mLs by mouth 4 (four) times daily as needed.   CETIRIZINE (ZYRTEC) 10 MG TABLET    Take 1 tablet (10 mg total) by mouth daily.   FLUTICASONE (FLONASE) 50 MCG/ACT NASAL SPRAY    Place 1 spray into both nostrils 2 (two) times daily.   ONDANSETRON (ZOFRAN-ODT) 4 MG DISINTEGRATING TABLET    Take 1 tablet (4 mg total) by mouth every 8 (eight) hours as needed for nausea or vomiting.        This chart was dictated using voice recognition software/Dragon. Despite best efforts to proofread, errors can occur which can change the meaning. Any change was purely unintentional.    Darletta Moll, PA-C 02/23/16 1649    Hinda Kehr, MD 02/23/16 1740

## 2016-02-23 NOTE — ED Triage Notes (Signed)
Fever, body aches and generalized body aches started over the weekend.

## 2016-02-23 NOTE — ED Notes (Signed)
Pt reports chills, bodyaches, cough for 5 days.  Intermittent fevers. Nonsmoker.  Pt states coughing up orange phlegm.  Pt alert.

## 2016-03-20 ENCOUNTER — Emergency Department: Payer: Commercial Managed Care - HMO

## 2016-03-20 ENCOUNTER — Emergency Department
Admission: EM | Admit: 2016-03-20 | Discharge: 2016-03-20 | Disposition: A | Discharge status: Home / Self Care | Payer: Commercial Managed Care - HMO | Attending: Emergency Medicine | Admitting: Emergency Medicine

## 2016-03-20 DIAGNOSIS — Z859 Personal history of malignant neoplasm, unspecified: Secondary | ICD-10-CM | POA: Insufficient documentation

## 2016-03-20 DIAGNOSIS — F17200 Nicotine dependence, unspecified, uncomplicated: Secondary | ICD-10-CM | POA: Insufficient documentation

## 2016-03-20 DIAGNOSIS — R1310 Dysphagia, unspecified: Secondary | ICD-10-CM | POA: Insufficient documentation

## 2016-03-20 MED ORDER — IOPAMIDOL (ISOVUE-300) INJECTION 61%
75.0000 mL | Freq: Once | INTRAVENOUS | Status: AC | PRN
  Administered 2016-03-20: 75 mL via INTRAVENOUS
  Filled 2016-03-20: qty 75
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
## 2016-03-20 NOTE — ED Notes (Signed)
Pt back from x-ray.

## 2016-03-20 NOTE — ED Triage Notes (Signed)
Pt states that he was eating a salad on Christmas and experienced trouble swallowing. States that this is an ongoing problem X 3 years since he was dx with CA at first. Has not seen a GI specialist for follow up. Pt states he was able to cough the salad up. Scratchy throat at this time. No RR distress.

## 2016-03-20 NOTE — ED Provider Notes (Addendum)
[facility] Emergency Department Provider Note        Time seen: ----------------------------------------- 2:42 PM on 03/20/2016 -----------------------------------------    I have reviewed the triage vital signs and the nursing notes.   HISTORY  Chief Complaint Dysphagia (ongoing X 3 years)    HPI Randy [name] is a 45 y.o. male who presents to ER for trouble swallowing. Patient has had this problem on and off for years. Patient states he was eating a salad on Christmas and experienced trouble swallowing. Initially he had Hodgkin's lymphoma in the past which seemed to cause these symptoms due to left no swelling in his neck. He has not seen a GI specialist or ENT specialist for follow-up. Patient states he was able to cough a salad. He complains of somewhat sore throat at this time.   Past Medical History:  Diagnosis Date  . Bipolar 1 disorder ([place])   . Cancer ([name])     There are no active problems to display for this patient.   History reviewed. No pertinent surgical history.  Allergies Patient has no known allergies.  Social History Social History  Substance Use Topics  . Smoking status: Never Smoker  . Smokeless tobacco: Current User  . Alcohol use No    Review of Systems Constitutional: Negative for fever. ENT: Positive for dysphagia Cardiovascular: Negative for chest pain. Respiratory: Negative for shortness of breath. Gastrointestinal: Negative for abdominal pain, vomiting and diarrhea. Genitourinary: Negative for dysuria. Musculoskeletal: Negative for back pain. Skin: Negative for rash. Neurological: Negative for headaches, focal weakness or numbness.  10-point ROS otherwise negative.  ____________________________________________   PHYSICAL EXAM:  VITAL SIGNS: ED Triage Vitals  Enc Vitals Group     BP 03/20/16 1215 (!) 159/92     Pulse Rate 03/20/16 1215 81     Resp 03/20/16 1215 16     Temp 03/20/16 1215 98.3 F  (36.8 C)     Temp Source 03/20/16 1215 Oral     SpO2 03/20/16 1215 100 %     Weight 03/20/16 1215 235 lb (106.6 kg)     Height 03/20/16 1215 5\' 11"  (1.803 m)     Head Circumference --      Peak Flow --      Pain Score 03/20/16 1216 5     Pain Loc --      Pain Edu? --      Excl. in [place]? --     Constitutional: Alert and oriented. Well appearing and in no distress. Eyes: Conjunctivae are normal. PERRL. Normal extraocular movements. ENT   Head: Normocephalic and atraumatic.   Nose: No congestion/rhinnorhea.   Mouth/Throat: Mucous membranes are moist.No pharyngeal erythema   Neck: No stridor. No adenopathy Cardiovascular: Normal rate, regular rhythm. No murmurs, rubs, or gallops. Respiratory: Normal respiratory effort without tachypnea nor retractions. Breath sounds are clear and equal bilaterally. No wheezes/rales/rhonchi. Gastrointestinal: Soft and nontender. Normal bowel sounds Musculoskeletal: Nontender with normal range of motion in all extremities. No lower extremity tenderness nor edema. Neurologic:  Normal speech and language. No gross focal neurologic deficits are appreciated.  Skin:  Skin is warm, dry and intact. No rash noted. Psychiatric: Mood and affect are normal. Speech and behavior are normal.  ___________________________________________  ED COURSE:  Pertinent labs & imaging results that were available during my care of the patient were reviewed by me and considered in my medical decision making (see chart for details). Clinical Course   Patient is no distress, due to persistent  symptoms. We will reassess for CT imaging.  Procedures ____________________________________________   RADIOLOGY Images were viewed by me  CT angiogram of the neck IMPRESSION: 1. No acute finding or explanation for symptoms. 2. History of lymphoma. Normal appearance of cervical lymph nodes. 3. Poor dentition which may explain bilateral maxillary  chronic sinusitis. ____________________________________________  FINAL ASSESSMENT AND PLAN  Dysphagia  Plan: Patient with imaging as dictated above. Patient had a reassuring CT here, unclear etiology of his symptoms but there may be a psychiatric component. He is stable for outpatient follow-up. I will refer him to ENT for follow-up   Earleen Newport, MD   Note: This dictation was prepared with Dragon dictation. Any transcriptional errors that result from this process are unintentional    Earleen Newport, MD 03/20/16 [place], MD 03/20/16 [phone]

## 2016-05-09 DIAGNOSIS — F209 Schizophrenia, unspecified: Secondary | ICD-10-CM | POA: Diagnosis not present

## 2016-05-09 DIAGNOSIS — F3341 Major depressive disorder, recurrent, in partial remission: Secondary | ICD-10-CM | POA: Diagnosis not present

## 2016-06-27 DIAGNOSIS — F209 Schizophrenia, unspecified: Secondary | ICD-10-CM | POA: Diagnosis not present

## 2016-06-27 DIAGNOSIS — F3341 Major depressive disorder, recurrent, in partial remission: Secondary | ICD-10-CM | POA: Diagnosis not present

## 2016-10-03 ENCOUNTER — Ambulatory Visit
Admission: RE | Admit: 2016-10-03 | Discharge: 2016-10-03 | Payer: MEDICARE | Attending: Sports Medicine | Admitting: Sports Medicine

## 2016-10-03 DIAGNOSIS — R062 Wheezing: Secondary | ICD-10-CM

## 2016-10-03 DIAGNOSIS — F17200 Nicotine dependence, unspecified, uncomplicated: Secondary | ICD-10-CM

## 2016-10-03 DIAGNOSIS — J40 Bronchitis, not specified as acute or chronic: Secondary | ICD-10-CM

## 2016-10-03 DIAGNOSIS — R1310 Dysphagia, unspecified: Secondary | ICD-10-CM

## 2016-10-03 DIAGNOSIS — K449 Diaphragmatic hernia without obstruction or gangrene: Secondary | ICD-10-CM

## 2016-10-03 MED ORDER — OMEPRAZOLE 40 MG CAPSULE,DELAYED RELEASE
ORAL_CAPSULE | Freq: Every day | ORAL | 3 refills | 0 days | Status: CP
Start: 2016-10-03 — End: 2017-03-30

## 2016-10-03 MED ORDER — FLUTICASONE PROPIONATE 115 MCG-SALMETEROL 21 MCG/ACTUATION HFA INHALER
Freq: Two times a day (BID) | RESPIRATORY_TRACT | 3 refills | 0 days | Status: CP
Start: 2016-10-03 — End: 2016-10-12

## 2016-10-03 MED ORDER — NICOTINE 21 MG/24 HR DAILY TRANSDERMAL PATCH
MEDICATED_PATCH | Freq: Every day | TRANSDERMAL | 0 refills | 0 days | Status: CP
Start: 2016-10-03 — End: 2017-04-10

## 2016-10-05 MED FILL — OMEPRAZOLE/20MG/CPDR: OMEPRAZOLE/20MG/CPDR | 30 days supply | Qty: 60 | Fill #0

## 2016-10-05 MED FILL — NICOTINE TRANSDERM/21MG/24HR/PAT: NICOTINE TRANSDERM/21MG/24HR/PAT | 28 days supply | Qty: 28 | Fill #0

## 2016-10-12 MED ORDER — BUDESONIDE-FORMOTEROL HFA 160 MCG-4.5 MCG/ACTUATION AEROSOL INHALER
Freq: Two times a day (BID) | RESPIRATORY_TRACT | 3 refills | 0 days | Status: CP
Start: 2016-10-12 — End: 2016-11-02

## 2016-10-24 MED FILL — SYMBICORT/160-4.5/AER: SYMBICORT/160-4.5/AER | 30 days supply | Qty: 1 | Fill #0

## 2016-10-25 MED FILL — ZYPREXA/15MG/TAB: ZYPREXA/15MG/TAB | 30 days supply | Qty: 60 | Fill #1

## 2016-10-25 MED FILL — TRAZODONE/50MG/TAB: TRAZODONE/50MG/TAB | 30 days supply | Qty: 30 | Fill #1

## 2016-10-25 MED FILL — FLUOXETINE HCL/20MG/CAPS: FLUOXETINE HCL/20MG/CAPS | 30 days supply | Qty: 90 | Fill #1

## 2016-11-02 ENCOUNTER — Ambulatory Visit
Admission: RE | Admit: 2016-11-02 | Discharge: 2016-11-02 | Disposition: A | Discharge status: Home / Self Care | Payer: MEDICARE

## 2016-11-02 DIAGNOSIS — R062 Wheezing: Secondary | ICD-10-CM

## 2016-11-02 DIAGNOSIS — J40 Bronchitis, not specified as acute or chronic: Principal | ICD-10-CM

## 2016-11-02 MED ORDER — MOMETASONE-FORMOTEROL HFA 200 MCG-5 MCG/ACTUATION AEROSOL INHALER
Freq: Two times a day (BID) | RESPIRATORY_TRACT | 3 refills | 0.00000 days | Status: CP
Start: 2016-11-02 — End: 2017-03-30

## 2016-11-02 MED ORDER — MOMETASONE-FORMOTEROL HFA 200 MCG-5 MCG/ACTUATION AEROSOL INHALER: 2 | g | 3 refills | 0 days

## 2016-11-14 ENCOUNTER — Ambulatory Visit
Admission: RE | Admit: 2016-11-14 | Discharge: 2016-11-14 | Disposition: A | Discharge status: Home / Self Care | Payer: MEDICARE | Attending: Student in an Organized Health Care Education/Training Program | Admitting: Student in an Organized Health Care Education/Training Program

## 2016-11-14 DIAGNOSIS — F209 Schizophrenia, unspecified: Principal | ICD-10-CM

## 2016-11-14 DIAGNOSIS — Z79899 Other long term (current) drug therapy: Secondary | ICD-10-CM

## 2016-11-14 DIAGNOSIS — F3341 Major depressive disorder, recurrent, in partial remission: Secondary | ICD-10-CM

## 2016-11-14 MED ORDER — OLANZAPINE 15 MG TABLET
ORAL_TABLET | Freq: Every evening | ORAL | 4 refills | 0.00000 days | Status: CP
Start: 2016-11-14 — End: 2016-11-14

## 2016-11-14 MED ORDER — FLUOXETINE 20 MG CAPSULE
ORAL_CAPSULE | Freq: Every day | ORAL | 4 refills | 0 days | Status: CP
Start: 2016-11-14 — End: 2017-04-02

## 2016-11-14 MED ORDER — FLUOXETINE 20 MG CAPSULE: 60 mg | capsule | Freq: Every day | 4 refills | 0 days | Status: AC

## 2016-11-14 MED ORDER — TRAZODONE 50 MG TABLET
ORAL_TABLET | Freq: Every evening | ORAL | 3 refills | 0.00000 days | Status: CP
Start: 2016-11-14 — End: 2016-11-14

## 2016-11-14 MED ORDER — TRAZODONE 50 MG TABLET: 50 mg | tablet | 3 refills | 0 days

## 2016-11-14 MED ORDER — OLANZAPINE 15 MG TABLET: 30 mg | tablet | Freq: Every evening | 4 refills | 0 days | Status: AC

## 2016-11-27 ENCOUNTER — Ambulatory Visit
Admission: RE | Admit: 2016-11-27 | Discharge: 2016-11-27 | Disposition: A | Discharge status: Home / Self Care | Attending: Sports Medicine | Admitting: Sports Medicine

## 2016-11-27 DIAGNOSIS — R42 Dizziness and giddiness: Secondary | ICD-10-CM

## 2016-11-27 DIAGNOSIS — R55 Syncope and collapse: Secondary | ICD-10-CM

## 2016-11-27 DIAGNOSIS — H6123 Impacted cerumen, bilateral: Principal | ICD-10-CM

## 2016-11-27 DIAGNOSIS — R05 Cough: Secondary | ICD-10-CM

## 2016-11-27 MED ORDER — CARBAMIDE PEROXIDE 6.5 % EAR DROPS
Freq: Two times a day (BID) | OTIC | 2 refills | 0 days | Status: CP
Start: 2016-11-27 — End: 2017-03-30

## 2016-11-27 MED ORDER — CETIRIZINE 10 MG TABLET: 10 mg | tablet | Freq: Every day | 2 refills | 0 days | Status: AC

## 2016-11-27 MED ORDER — FLUTICASONE PROPIONATE 50 MCG/ACTUATION NASAL SPRAY,SUSPENSION
Freq: Every day | NASAL | 0 refills | 0.00000 days | Status: CP
Start: 2016-11-27 — End: 2017-03-30

## 2016-11-27 MED ORDER — FLUTICASONE PROPIONATE 50 MCG/ACTUATION NASAL SPRAY,SUSPENSION: 1 | g | Freq: Every day | 0 refills | 0 days | Status: AC

## 2016-11-27 MED ORDER — CETIRIZINE 10 MG TABLET
Freq: Every day | ORAL | 2 refills | 0.00000 days | Status: CP
Start: 2016-11-27 — End: 2016-11-27

## 2016-11-29 MED FILL — ALL DAY ALLERGY/10MG/TABS: ALL DAY ALLERGY/10MG/TABS | 30 days supply | Qty: 30 | Fill #0

## 2016-11-29 MED FILL — FLUTICASONE/0.05%/SPRY: FLUTICASONE/0.05%/SPRY | 60 days supply | Qty: 1 | Fill #0

## 2016-12-24 ENCOUNTER — Ambulatory Visit
Admission: RE | Admit: 2016-12-24 | Discharge: 2016-12-24 | Disposition: A | Discharge status: Home / Self Care | Attending: Student in an Organized Health Care Education/Training Program | Admitting: Student in an Organized Health Care Education/Training Program

## 2016-12-24 DIAGNOSIS — R1310 Dysphagia, unspecified: Principal | ICD-10-CM

## 2016-12-26 MED FILL — TRAZODONE/50MG/TAB: TRAZODONE/50MG/TAB | 30 days supply | Qty: 30 | Fill #2

## 2016-12-26 MED FILL — OLANZAPINE/15MG/TABS: OLANZAPINE/15MG/TABS | 30 days supply | Qty: 60 | Fill #2

## 2016-12-26 MED FILL — FLUOXETINE HCL/20MG/CAPS: FLUOXETINE HCL/20MG/CAPS | 30 days supply | Qty: 90 | Fill #2

## 2016-12-28 ENCOUNTER — Ambulatory Visit: Admission: RE | Admit: 2016-12-28 | Discharge: 2016-12-28 | Attending: Sports Medicine

## 2016-12-28 DIAGNOSIS — Z23 Encounter for immunization: Secondary | ICD-10-CM

## 2016-12-28 DIAGNOSIS — J029 Acute pharyngitis, unspecified: Secondary | ICD-10-CM

## 2016-12-28 DIAGNOSIS — J449 Chronic obstructive pulmonary disease, unspecified: Principal | ICD-10-CM

## 2016-12-28 DIAGNOSIS — R1310 Dysphagia, unspecified: Secondary | ICD-10-CM

## 2016-12-28 DIAGNOSIS — R05 Cough: Secondary | ICD-10-CM

## 2016-12-28 MED ORDER — BENZONATATE 100 MG CAPSULE
ORAL_CAPSULE | Freq: Three times a day (TID) | ORAL | 0 refills | 0.00000 days | Status: CP | PRN
Start: 2016-12-28 — End: 2017-12-28

## 2016-12-28 MED ORDER — CETIRIZINE 10 MG TABLET
ORAL_TABLET | Freq: Every day | ORAL | 2 refills | 0.00000 days | Status: CP
Start: 2016-12-28 — End: 2016-12-28

## 2016-12-28 MED ORDER — BENZONATATE 100 MG CAPSULE: 100 mg | capsule | 0 refills | 0 days

## 2016-12-28 MED ORDER — CETIRIZINE 10 MG TABLET: 10 mg | tablet | 2 refills | 0 days

## 2017-02-20 MED FILL — OLANZAPINE/15MG/TABS: OLANZAPINE/15MG/TABS | 30 days supply | Qty: 60 | Fill #3

## 2017-02-20 MED FILL — TRAZODONE/50MG/TAB: TRAZODONE/50MG/TAB | 30 days supply | Qty: 30 | Fill #3

## 2017-02-20 MED FILL — FLUOXETINE HCL/20MG/CAPS: FLUOXETINE HCL/20MG/CAPS | 30 days supply | Qty: 90 | Fill #3

## 2017-04-01 MED ORDER — OMEPRAZOLE 20 MG CAPSULE,DELAYED RELEASE
ORAL_CAPSULE | 2 refills | 0 days
Start: 2017-04-01 — End: 2018-04-01

## 2017-04-01 MED ORDER — MOMETASONE-FORMOTEROL HFA 200 MCG-5 MCG/ACTUATION AEROSOL INHALER: 2 | Inhaler | Freq: Two times a day (BID) | 2 refills | 0 days | Status: AC

## 2017-04-01 MED ORDER — FLUTICASONE PROPIONATE 50 MCG/ACTUATION NASAL SPRAY,SUSPENSION
Freq: Every day | NASAL | 2 refills | 0.00000 days | Status: CP
Start: 2017-04-01 — End: 2017-07-29

## 2017-04-01 MED ORDER — ALBUTEROL SULFATE HFA 90 MCG/ACTUATION AEROSOL INHALER
Freq: Four times a day (QID) | RESPIRATORY_TRACT | 2 refills | 0.00000 days | Status: CP | PRN
Start: 2017-04-01 — End: 2017-07-29

## 2017-04-01 MED ORDER — FLUTICASONE PROPIONATE 50 MCG/ACTUATION NASAL SPRAY,SUSPENSION: 1 | g | 2 refills | 0 days

## 2017-04-01 MED ORDER — ALBUTEROL SULFATE HFA 90 MCG/ACTUATION AEROSOL INHALER: 2 | g | 4 refills | 0 days

## 2017-04-01 MED ORDER — MOMETASONE-FORMOTEROL HFA 200 MCG-5 MCG/ACTUATION AEROSOL INHALER
Freq: Two times a day (BID) | RESPIRATORY_TRACT | 2 refills | 0.00000 days | Status: CP
Start: 2017-04-01 — End: 2017-04-01

## 2017-04-01 MED ORDER — CARBAMIDE PEROXIDE 6.5 % EAR DROPS
Freq: Two times a day (BID) | OTIC | 8 refills | 0 days | Status: CP
Start: 2017-04-01 — End: ?

## 2017-04-01 MED ORDER — CETIRIZINE 10 MG TABLET
ORAL_TABLET | Freq: Every day | ORAL | 2 refills | 0.00000 days | Status: CP
Start: 2017-04-01 — End: 2017-07-29

## 2017-04-01 MED ORDER — MOMETASONE-FORMOTEROL HFA 200 MCG-5 MCG/ACTUATION AEROSOL INHALER: 2 | g | 2 refills | 0 days

## 2017-04-01 MED ORDER — ALBUTEROL SULFATE HFA 90 MCG/ACTUATION AEROSOL INHALER: 2 | Inhaler | Freq: Four times a day (QID) | 2 refills | 0 days | Status: AC

## 2017-04-01 MED ORDER — OMEPRAZOLE 40 MG CAPSULE,DELAYED RELEASE
ORAL_CAPSULE | Freq: Every day | ORAL | 2 refills | 0.00000 days | Status: CP
Start: 2017-04-01 — End: 2017-07-29

## 2017-04-01 MED ORDER — CETIRIZINE 10 MG TABLET: each | 2 refills | 0 days

## 2017-04-02 MED ORDER — TRAZODONE 50 MG TABLET
ORAL_TABLET | Freq: Every evening | ORAL | 3 refills | 0.00000 days | Status: CP
Start: 2017-04-02 — End: 2017-04-02

## 2017-04-02 MED ORDER — TRAZODONE 50 MG TABLET: tablet | 3 refills | 0 days

## 2017-04-02 MED ORDER — FLUOXETINE 20 MG CAPSULE: capsule | 4 refills | 0 days

## 2017-04-02 MED ORDER — OLANZAPINE 15 MG TABLET: tablet | 9 refills | 0 days

## 2017-04-02 MED ORDER — OLANZAPINE 15 MG TABLET
ORAL_TABLET | Freq: Every evening | ORAL | 4 refills | 0 days | Status: CP
Start: 2017-04-02 — End: 2017-06-05

## 2017-04-02 MED ORDER — FLUOXETINE 20 MG CAPSULE
ORAL_CAPSULE | Freq: Every day | ORAL | 4 refills | 0.00000 days | Status: CP
Start: 2017-04-02 — End: 2017-04-02

## 2017-04-10 ENCOUNTER — Ambulatory Visit: Admit: 2017-04-10 | Discharge: 2017-04-11 | Payer: MEDICARE | Attending: Sports Medicine | Primary: Sports Medicine

## 2017-04-10 ENCOUNTER — Ambulatory Visit
Admit: 2017-04-10 | Discharge: 2017-04-11 | Payer: MEDICARE | Attending: Student in an Organized Health Care Education/Training Program | Primary: Student in an Organized Health Care Education/Training Program

## 2017-04-10 DIAGNOSIS — J449 Chronic obstructive pulmonary disease, unspecified: Secondary | ICD-10-CM

## 2017-04-10 DIAGNOSIS — R1310 Dysphagia, unspecified: Secondary | ICD-10-CM

## 2017-04-10 DIAGNOSIS — F3341 Major depressive disorder, recurrent, in partial remission: Principal | ICD-10-CM

## 2017-04-10 DIAGNOSIS — Z23 Encounter for immunization: Principal | ICD-10-CM

## 2017-04-10 DIAGNOSIS — Z79899 Other long term (current) drug therapy: Secondary | ICD-10-CM

## 2017-04-10 DIAGNOSIS — R7309 Other abnormal glucose: Secondary | ICD-10-CM

## 2017-04-10 DIAGNOSIS — F209 Schizophrenia, unspecified: Secondary | ICD-10-CM

## 2017-04-10 DIAGNOSIS — R1312 Dysphagia, oropharyngeal phase: Secondary | ICD-10-CM

## 2017-04-10 DIAGNOSIS — E782 Mixed hyperlipidemia: Secondary | ICD-10-CM

## 2017-04-10 MED ORDER — ATORVASTATIN 20 MG TABLET
ORAL_TABLET | Freq: Every day | ORAL | 3 refills | 0.00000 days | Status: CP
Start: 2017-04-10 — End: 2017-04-10

## 2017-04-10 MED ORDER — OMEGA-3 ACID ETHYL ESTERS 1 GRAM CAPSULE: capsule | 3 refills | 0 days

## 2017-04-10 MED ORDER — ATORVASTATIN 20 MG TABLET: 20 mg | tablet | 3 refills | 0 days

## 2017-04-10 MED ORDER — OMEGA-3 ACID ETHYL ESTERS 1 GRAM CAPSULE
ORAL_CAPSULE | Freq: Every day | ORAL | 3 refills | 0.00000 days | Status: CP
Start: 2017-04-10 — End: 2017-07-29

## 2017-04-12 ENCOUNTER — Ambulatory Visit: Admit: 2017-04-12 | Discharge: 2017-04-12 | Payer: MEDICARE

## 2017-04-12 ENCOUNTER — Encounter
Admit: 2017-04-12 | Discharge: 2017-04-12 | Payer: MEDICARE | Attending: Certified Registered" | Primary: Certified Registered"

## 2017-04-12 DIAGNOSIS — Z1211 Encounter for screening for malignant neoplasm of colon: Principal | ICD-10-CM

## 2017-04-12 LAB — HM COLONOSCOPY

## 2017-04-19 MED FILL — OLANZAPINE/15MG/TABS: OLANZAPINE/15MG/TABS | 14 days supply | Qty: 28 | Fill #0

## 2017-04-19 MED FILL — OMEPRAZOLE/20MG/CPDR: OMEPRAZOLE/20MG/CPDR | 14 days supply | Qty: 28 | Fill #0

## 2017-04-19 MED FILL — FLUOXETINE HCL/20MG/CAPS: FLUOXETINE HCL/20MG/CAPS | 14 days supply | Qty: 42 | Fill #0

## 2017-04-19 MED FILL — DULERA/200-5MCG/AERO: DULERA/200-5MCG/AERO | 30 days supply | Qty: 1 | Fill #0

## 2017-04-19 MED FILL — ATORVASTATIN/20MG/TABS: ATORVASTATIN/20MG/TABS | 14 days supply | Qty: 14 | Fill #0

## 2017-04-19 MED FILL — TRAZODONE/50MG/TAB: TRAZODONE/50MG/TAB | 14 days supply | Qty: 14 | Fill #0

## 2017-04-19 MED FILL — FLUTICASONE/50MCG/ACT/SUSP: FLUTICASONE/50MCG/ACT/SUSP | 30 days supply | Qty: 1 | Fill #0

## 2017-04-19 MED FILL — PROVENTIL HFA//AERS: PROVENTIL HFA//AERS | 25 days supply | Qty: 1 | Fill #0

## 2017-05-05 MED ORDER — FLUOXETINE 20 MG CAPSULE
ORAL_CAPSULE | Freq: Every day | ORAL | 4 refills | 0.00000 days | Status: CP
Start: 2017-05-05 — End: 2017-07-29

## 2017-05-05 MED ORDER — TRAZODONE 50 MG TABLET
ORAL_TABLET | Freq: Every evening | ORAL | 3 refills | 0.00000 days | Status: CP
Start: 2017-05-05 — End: 2017-05-20

## 2017-05-05 MED ORDER — TRAZODONE 50 MG TABLET: 50 mg | tablet | Freq: Every evening | 3 refills | 0 days | Status: AC

## 2017-05-05 MED ORDER — FLUOXETINE 20 MG CAPSULE: 60 mg | capsule | Freq: Every day | 4 refills | 0 days | Status: AC

## 2017-05-05 MED FILL — TRAZODONE HYDROCHLORIDE/50MG/TABS: TRAZODONE HYDROCHLORIDE/50MG/TABS | 30 days supply | Qty: 30 | Fill #1

## 2017-05-05 MED FILL — ATORVASTATIN/20MG/TABS: ATORVASTATIN/20MG/TABS | 90 days supply | Qty: 90 | Fill #1

## 2017-05-05 MED FILL — OMEPRAZOLE/20MG/CPDR: OMEPRAZOLE/20MG/CPDR | 90 days supply | Qty: 180 | Fill #1

## 2017-05-05 MED FILL — FLUOXETINE HCL/20MG/CAPS: FLUOXETINE HCL/20MG/CAPS | 30 days supply | Qty: 90 | Fill #1

## 2017-05-20 MED ORDER — TRAZODONE 150 MG TABLET
ORAL_TABLET | Freq: Every evening | ORAL | 3 refills | 0.00000 days | Status: CP
Start: 2017-05-20 — End: 2017-05-20

## 2017-05-20 MED ORDER — TRAZODONE 150 MG TABLET: 150 mg | tablet | 3 refills | 0 days

## 2017-06-05 ENCOUNTER — Ambulatory Visit
Admit: 2017-06-05 | Discharge: 2017-06-05 | Payer: MEDICARE | Attending: Student in an Organized Health Care Education/Training Program | Primary: Student in an Organized Health Care Education/Training Program

## 2017-06-05 ENCOUNTER — Ambulatory Visit: Admit: 2017-06-05 | Discharge: 2017-06-05 | Payer: MEDICARE | Attending: Sports Medicine | Primary: Sports Medicine

## 2017-06-05 DIAGNOSIS — G4733 Obstructive sleep apnea (adult) (pediatric): Secondary | ICD-10-CM

## 2017-06-05 DIAGNOSIS — F3341 Major depressive disorder, recurrent, in partial remission: Secondary | ICD-10-CM

## 2017-06-05 DIAGNOSIS — F209 Schizophrenia, unspecified: Principal | ICD-10-CM

## 2017-06-05 DIAGNOSIS — R0789 Other chest pain: Secondary | ICD-10-CM

## 2017-06-05 DIAGNOSIS — Z79899 Other long term (current) drug therapy: Secondary | ICD-10-CM

## 2017-06-05 DIAGNOSIS — J449 Chronic obstructive pulmonary disease, unspecified: Principal | ICD-10-CM

## 2017-06-05 DIAGNOSIS — R0602 Shortness of breath: Secondary | ICD-10-CM

## 2017-06-05 MED ORDER — IPRATROPIUM BROMIDE 17 MCG/ACTUATION HFA AEROSOL INHALER
Freq: Four times a day (QID) | RESPIRATORY_TRACT | 2 refills | 0.00000 days | Status: CP
Start: 2017-06-05 — End: 2017-07-29

## 2017-06-05 MED ORDER — PALIPERIDONE ER 6 MG TABLET,EXTENDED RELEASE 24 HR: 6 mg | tablet | Freq: Every morning | 0 refills | 0 days

## 2017-06-05 MED ORDER — PALIPERIDONE ER 6 MG TABLET,EXTENDED RELEASE 24 HR
ORAL_TABLET | Freq: Every morning | ORAL | 0 refills | 0.00000 days | Status: CP
Start: 2017-06-05 — End: 2017-06-05

## 2017-06-05 MED ORDER — IPRATROPIUM BROMIDE 17 MCG/ACTUATION HFA AEROSOL INHALER: 2 | g | 2 refills | 0 days

## 2017-06-05 MED ORDER — OLANZAPINE 20 MG TABLET: 20 mg | tablet | Freq: Every evening | 1 refills | 0 days | Status: AC

## 2017-06-05 MED ORDER — OLANZAPINE 20 MG TABLET
ORAL_TABLET | Freq: Every evening | ORAL | 1 refills | 0.00000 days | Status: CP
Start: 2017-06-05 — End: 2017-07-29

## 2017-06-05 MED ORDER — PALIPERIDONE ER 6 MG TABLET,EXTENDED RELEASE 24 HR: 6 mg | tablet | Freq: Every morning | 0 refills | 0 days | Status: AC

## 2017-06-07 MED FILL — OLANZAPINE 20MG/20MG/TABS: OLANZAPINE 20MG/20MG/TABS | 30 days supply | Qty: 30 | Fill #0

## 2017-06-11 MED FILL — FLUOXETINE HCL/20MG/CAPS: FLUOXETINE HCL/20MG/CAPS | 30 days supply | Qty: 90 | Fill #2

## 2017-06-11 MED FILL — TRAZODONE HYDROCHLORIDE/50MG/TABS: TRAZODONE HYDROCHLORIDE/50MG/TABS | 30 days supply | Qty: 30 | Fill #2

## 2017-07-05 MED FILL — ALBUTEROL HFA/90MCG/AER: ALBUTEROL HFA/90MCG/AER | 25 days supply | Qty: 1 | Fill #1

## 2017-07-05 MED FILL — TRAZODONE HYDROCHLORIDE/50MG/TABS: TRAZODONE HYDROCHLORIDE/50MG/TABS | 30 days supply | Qty: 30 | Fill #3

## 2017-07-05 MED FILL — OLANZAPINE/20MG/TABS: OLANZAPINE/20MG/TABS | 30 days supply | Qty: 30 | Fill #1

## 2017-07-05 MED FILL — DULERA/200-5MCG/AERO: DULERA/200-5MCG/AERO | 90 days supply | Qty: 3 | Fill #1

## 2017-07-05 MED FILL — FLUOXETINE HCL/20MG/CAPS: FLUOXETINE HCL/20MG/CAPS | 30 days supply | Qty: 90 | Fill #3

## 2017-07-05 MED FILL — FLUTICASONE/50MCG/ACT/SUSP: FLUTICASONE/50MCG/ACT/SUSP | 90 days supply | Qty: 3 | Fill #1

## 2017-07-10 ENCOUNTER — Ambulatory Visit
Admit: 2017-07-10 | Discharge: 2017-07-11 | Payer: MEDICARE | Attending: Student in an Organized Health Care Education/Training Program | Primary: Student in an Organized Health Care Education/Training Program

## 2017-07-10 DIAGNOSIS — Z79899 Other long term (current) drug therapy: Secondary | ICD-10-CM

## 2017-07-10 DIAGNOSIS — F209 Schizophrenia, unspecified: Principal | ICD-10-CM

## 2017-07-10 DIAGNOSIS — F3341 Major depressive disorder, recurrent, in partial remission: Secondary | ICD-10-CM

## 2017-07-10 MED ORDER — TRAZODONE 100 MG TABLET: 200 mg | tablet | Freq: Every evening | 3 refills | 0 days | Status: AC

## 2017-07-10 MED ORDER — TRAZODONE 100 MG TABLET
ORAL_TABLET | Freq: Every evening | ORAL | 3 refills | 0.00000 days | Status: CP
Start: 2017-07-10 — End: 2017-07-10

## 2017-07-10 MED ORDER — PALIPERIDONE PALMITATE 234 MG/1.5 ML INTRAMUSCULAR SYRINGE
INJECTION | INTRAMUSCULAR | 0 refills | 0.00000 days | Status: CP
Start: 2017-07-10 — End: 2017-08-26

## 2017-07-10 MED ORDER — PALIPERIDONE PALMITATE 234 MG/1.5 ML INTRAMUSCULAR SYRINGE: mL | 0 refills | 0 days

## 2017-07-10 MED ORDER — MELATONIN 3 MG TABLET: 3 mg | tablet | Freq: Every evening | 3 refills | 0 days | Status: AC

## 2017-07-10 MED ORDER — MELATONIN 3 MG TABLET
Freq: Every evening | ORAL | 3 refills | 0.00000 days | Status: CP
Start: 2017-07-10 — End: 2017-07-10

## 2017-07-10 NOTE — Unmapped (Addendum)
Follow-up instructions:  -- INCREASE Trazodone to 200mg  every night  -- START melatonin 3mg  every night - take at least two hours before bedtime until you figure out how long it takes to kick in for you  -- Please continue taking your medications as prescribed for your mental health.   -- Do not make changes to your medications, including taking more or less than prescribed, unless under the supervision of your physician. Be aware that some medications may make you feel worse if abruptly stopped  -- Please refrain from using illicit substances, as these can affect your mood and could cause anxiety or other concerning symptoms.   -- Seek further medical care for any increase in symptoms or new symptoms such as thoughts of wanting to hurt yourself or hurt others.   ??  Contact info:  Life-threatening emergencies: call 911 or go to the nearest ER for medical or psychiatric attention.   ??  Issues that need urgent attention but are not life threatening: call the outpatient clinic at [phone] for assistance.   ??  Non-urgent routine concerns, questions, and refill requests: please leave me a voicemail at [phone] and I will get back to you within 2 business days.   ??  Regarding appointments:  - If you need to cancel your appointment, we ask that you call [phone] at least 24 hours before your scheduled appointment.  - If for any reason you arrive 15 minutes later than your scheduled appointment time, you may not be seen and your visit may be rescheduled.  - Please remember that we will not automatically reschedule missed appointments.  - If you no show, arrive late, or cancel within 24 hours of the scheduled appointment three (3) times with an individual clinic or provider, you can be dismissed from the clinic and will likely be referred to a provider in your community.  - We will do our best to be on time. Sometimes an emergency will arise that might cause your clinician to be late. We will try to inform you of this when you check in for your appointment. If you wait more than 15 minutes past your appointment time without such notice, please speak with the front desk staff.  ??  In the event of bad weather, the clinic staff will attempt to contact you, should your appointment need to be rescheduled. Additionally, you can call the Patient Weather Line [phone] for system-wide clinic status  ??  For more information and reminders regarding clinic policies (these were provided when you were admitted to the clinic), please ask the front desk.
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
## 2017-07-10 NOTE — Unmapped (Signed)
I saw and evaluated the patient, participating in the key portions of the service.  I reviewed the resident???s note.  I agree with the resident???s findings and plan. Catalina Pizza, MD

## 2017-07-16 MED FILL — OMEPRAZOLE/20MG/CPDR: OMEPRAZOLE/20MG/CPDR | 90 days supply | Qty: 180 | Fill #2

## 2017-07-16 MED FILL — ATORVASTATIN/20MG/TABS: ATORVASTATIN/20MG/TABS | 90 days supply | Qty: 90 | Fill #2

## 2017-07-30 MED ORDER — MELATONIN 3 MG TABLET
ORAL_TABLET | Freq: Every evening | ORAL | 3 refills | 0.00000 days | Status: CP
Start: 2017-07-30 — End: 2017-07-30

## 2017-07-30 MED ORDER — OLANZAPINE 20 MG TABLET: 20 mg | tablet | Freq: Every evening | 1 refills | 0 days | Status: AC

## 2017-07-30 MED ORDER — OLANZAPINE 20 MG TABLET
ORAL_TABLET | Freq: Every evening | ORAL | 1 refills | 0.00000 days | Status: CP
Start: 2017-07-30 — End: 2017-07-30

## 2017-07-30 MED ORDER — TRAZODONE 100 MG TABLET: 200 mg | tablet | Freq: Every evening | 3 refills | 0 days | Status: AC

## 2017-07-30 MED ORDER — TRAZODONE 100 MG TABLET
ORAL_TABLET | Freq: Every evening | ORAL | 3 refills | 0.00000 days | Status: CP
Start: 2017-07-30 — End: 2017-12-25

## 2017-07-30 MED ORDER — MELATONIN 3 MG TABLET: 3 mg | each | 3 refills | 0 days

## 2017-07-30 MED ORDER — FLUOXETINE 20 MG CAPSULE: 60 mg | capsule | Freq: Every day | 4 refills | 0 days | Status: AC

## 2017-07-30 MED ORDER — FLUOXETINE 20 MG CAPSULE
ORAL_CAPSULE | Freq: Every day | ORAL | 4 refills | 0.00000 days | Status: CP
Start: 2017-07-30 — End: 2018-04-23

## 2017-07-31 MED ORDER — MOMETASONE-FORMOTEROL HFA 200 MCG-5 MCG/ACTUATION AEROSOL INHALER: 2 | g | 2 refills | 0 days

## 2017-07-31 MED ORDER — OMEGA-3 ACID ETHYL ESTERS 1 GRAM CAPSULE
ORAL_CAPSULE | Freq: Every day | ORAL | 3 refills | 0.00000 days | Status: CP
Start: 2017-07-31 — End: 2017-07-31

## 2017-07-31 MED ORDER — FLUTICASONE PROPIONATE 50 MCG/ACTUATION NASAL SPRAY,SUSPENSION: 1 | g | Freq: Every day | 2 refills | 0 days | Status: AC

## 2017-07-31 MED ORDER — OMEPRAZOLE 20 MG CAPSULE,DELAYED RELEASE
ORAL_CAPSULE | Freq: Every day | ORAL | 1 refills | 0.00000 days
Start: 2017-07-31 — End: 2018-07-31

## 2017-07-31 MED ORDER — ALBUTEROL SULFATE HFA 90 MCG/ACTUATION AEROSOL INHALER: 2 | Inhaler | Freq: Four times a day (QID) | 2 refills | 0 days | Status: AC

## 2017-07-31 MED ORDER — OMEGA-3 ACID ETHYL ESTERS 1 GRAM CAPSULE: capsule | 3 refills | 0 days

## 2017-07-31 MED ORDER — CETIRIZINE 10 MG TABLET
ORAL_TABLET | Freq: Every day | ORAL | 2 refills | 0.00000 days | Status: CP
Start: 2017-07-31 — End: 2017-07-31

## 2017-07-31 MED ORDER — ALBUTEROL SULFATE HFA 90 MCG/ACTUATION AEROSOL INHALER
Freq: Four times a day (QID) | RESPIRATORY_TRACT | 2 refills | 0.00000 days | Status: CP | PRN
Start: 2017-07-31 — End: 2017-07-31

## 2017-07-31 MED ORDER — FLUTICASONE PROPIONATE 50 MCG/ACTUATION NASAL SPRAY,SUSPENSION
Freq: Every day | NASAL | 2 refills | 0.00000 days | Status: CP
Start: 2017-07-31 — End: 2017-07-31

## 2017-07-31 MED ORDER — ATORVASTATIN 20 MG TABLET
ORAL_TABLET | Freq: Every day | ORAL | 1 refills | 0.00000 days | Status: CP
Start: 2017-07-31 — End: 2018-07-31

## 2017-07-31 MED ORDER — CETIRIZINE 10 MG TABLET: 10 mg | tablet | Freq: Every day | 2 refills | 0 days | Status: AC

## 2017-07-31 MED ORDER — ATORVASTATIN 20 MG TABLET: 20 mg | tablet | Freq: Every day | 1 refills | 0 days | Status: AC

## 2017-07-31 MED ORDER — IPRATROPIUM BROMIDE 17 MCG/ACTUATION HFA AEROSOL INHALER
Freq: Four times a day (QID) | RESPIRATORY_TRACT | 2 refills | 0.00000 days | Status: CP
Start: 2017-07-31 — End: 2017-10-15

## 2017-07-31 MED ORDER — OMEPRAZOLE 40 MG CAPSULE,DELAYED RELEASE
ORAL_CAPSULE | Freq: Every day | ORAL | 1 refills | 0 days | Status: CP
Start: 2017-07-31 — End: 2017-07-31

## 2017-07-31 MED ORDER — IPRATROPIUM BROMIDE 17 MCG/ACTUATION HFA AEROSOL INHALER: 2 | g | 2 refills | 0 days

## 2017-07-31 MED ORDER — MOMETASONE-FORMOTEROL HFA 200 MCG-5 MCG/ACTUATION AEROSOL INHALER
Freq: Two times a day (BID) | RESPIRATORY_TRACT | 2 refills | 0.00000 days | Status: CP
Start: 2017-07-31 — End: 2017-10-15

## 2017-08-14 NOTE — Unmapped (Signed)
Patient has been approved to receive Invega from the manufacturer from  08/06/17 until 08/07/18 and will be shipped directly to the patient's home.

## 2017-08-19 MED FILL — FLUOXETINE HCL/20MG/CAPS: FLUOXETINE HCL/20MG/CAPS | 30 days supply | Qty: 90 | Fill #4

## 2017-08-19 MED FILL — ALBUTEROL HFA/90MCG/AER: ALBUTEROL HFA/90MCG/AER | 25 days supply | Qty: 1 | Fill #2

## 2017-08-19 MED FILL — TRAZODONE/100MG/TAB: TRAZODONE/100MG/TAB | 15 days supply | Qty: 30 | Fill #0

## 2017-08-19 MED FILL — OLANZAPINE/20MG/TABS: OLANZAPINE/20MG/TABS | 30 days supply | Qty: 30 | Fill #0

## 2017-08-21 NOTE — Unmapped (Signed)
Clinic staff asked [place] to enroll in refill calls, but will not perform clinical calls since clinic is administering and managing clinically. Normally would opt out but clinic wanted a reminder to trigger refills.      [facility]   Patient Onboarding/Medication Counseling    Mr.Carrick is a 47 y.o. male with schizophrenia who I am setting up delivery of medication. This is a clinic administered medication, so did not counsel patient on medication.     Medication: Gean [place]ch[place]    Verified patient's date of birth / HIPAA.      Education Provided: ?? na/ clinic administered    Verified therapy is appropriate and should continue      Delivery Information    Medication Assistance provided: manufacturer assistance    Anticipated copay of $0 reviewed with clinic. Verified delivery address (clinic courier) in [facility] and reviewed medication storage requirement.    Scheduled delivery date: Thurs, May 30    Explained that we ship using UPS or courier and this shipment will not require a signature.      Explained the services we provide at [facility] and that each month we would call to set up refills.  Stressed importance of returning phone calls so that we could ensure they receive their medications in time each month.  Informed patient that we should be setting up refills 7-10 days prior to when they will run out of medication.  Informed patient that welcome packet will be sent.      Patient verbalized understanding of the above information as well as how to contact the pharmacy at [phone] option 4 with any questions/concerns.  The pharmacy is open Monday through Friday 8:30am-4:30pm.  A pharmacist is available 24/7 via pager to answer any clinical questions they may have.        Patient Specific Needs      ? Patient has no physical, cognitive, or cultural barriers.    Patient prefers to have medications discussed with  Other - clinic    ? Patient is able to read and understand education materials at a high school level or above.    ? Patient's primary language is  English           Presenter, broadcasting  [facility] Specialty Pharmacist

## 2017-08-22 MED FILL — INVEGA SUSTENNA/234/1.5/SUSP: INVEGA SUSTENNA/234/1.5/SUSP | 28 days supply | Qty: 1 | Fill #0

## 2017-08-26 ENCOUNTER — Institutional Professional Consult (permissible substitution): Admit: 2017-08-26 | Discharge: 2017-08-27 | Payer: MEDICARE | Attending: Psychiatry | Primary: Psychiatry

## 2017-08-26 MED ORDER — PALIPERIDONE PALMITATE 234 MG/1.5 ML INTRAMUSCULAR SYRINGE: mL | 11 refills | 0 days

## 2017-08-26 MED ORDER — PALIPERIDONE PALMITATE 234 MG/1.5 ML INTRAMUSCULAR SYRINGE
INJECTION | INTRAMUSCULAR | 11 refills | 0.00000 days | Status: CP
Start: 2017-08-26 — End: 2017-08-27

## 2017-08-27 NOTE — Unmapped (Signed)
Patient inadvertently received Invega Sustenna IM 234mg  on 6/3. The prescription had been ordered but not the administration. We had planned to administer the injection after first ensuring the patient tolerated PO Invega. I called the patient, who denies any side effects of the injection, including any symptoms of a dystonic reaction. He reports actually I feel better. Discussed that he should contact me if he experiences any side effects, especially muscle contractions or stiffness, and present to an ED if he experiences any significant side effects or has any concerns about his safety. Asked patient to decrease his nightly Zyprexa dose to 20mg  (from 30mg ).    Will plan to administer 156mg  IM injection on 6/10.  At that point, patient will decrease dose of Zyprexa to 10mg .

## 2017-09-06 NOTE — Unmapped (Signed)
Administered long-acting injectable today 08/26/17. Medication not documented in the Mar due to no available administrations.     Invega 234mg    NDC: 16109-604-54  Lot# [id number]  EXP 07-2018  Given in left deltoid

## 2017-09-11 NOTE — Unmapped (Signed)
[facility] Refill Coordination Note  Specialty Medication(s): Invega  Additional Medications shipped: Fluoxetine, Albuterol, Trazodone, Olanzapine    Ronnie Ramirez, DOB: 27-Aug-1970  Phone: [phone] (home) , Alternate phone contact: N/A  Phone or address changes today?: No  All above HIPAA information was verified with patient.  Shipping Address: [address] [place] [address]   Insurance changes? No    Completed refill call assessment today to schedule patient's medication shipment from the [facility] [phone]).      Confirmed the medication and dosage are correct and have not changed: Yes, regimen is correct and unchanged.    Confirmed patient started or stopped the following medications in the past month:  No, there are no changes reported at this time.    Are you tolerating your medication?:  Armstrong reports tolerating the medication.    ADHERENCE    Is this medicine transplant or covered by Medicare Part B? Yes.     No doses left       Did you miss any doses in the past 4 weeks? No missed doses reported.    FINANCIAL/SHIPPING    Delivery Scheduled: Yes, Expected medication delivery date: 09/17/17 by same day courier to clinic     The patient will receive an [facility] print out for each medication shipped and additional FDA Medication Guides as required.  Patient education from [place] or Robet Leu may also be included in the shipment.    [place] did not have any additional questions at this time.    Delivery address validated in [facility] scheduling system: Yes, address listed in [facility] is correct.    We will follow up with patient monthly for standard refill processing and delivery.      Thank you,  Elbony Mcclimans Vangie Bicker   [facility] Specialty Pharmacist

## 2017-09-15 MED FILL — OLANZAPINE/20MG/TABS: OLANZAPINE/20MG/TABS | 30 days supply | Qty: 30 | Fill #1

## 2017-09-15 MED FILL — TRAZODONE/100MG/TAB: TRAZODONE/100MG/TAB | 15 days supply | Qty: 30 | Fill #1

## 2017-09-15 MED FILL — ALBUTEROL HFA/90MCG/AER: ALBUTEROL HFA/90MCG/AER | 25 days supply | Qty: 1 | Fill #3

## 2017-09-15 MED FILL — FLUOXETINE HCL/20MG/CAPS: FLUOXETINE HCL/20MG/CAPS | 30 days supply | Qty: 90 | Fill #0

## 2017-09-17 MED FILL — INVEGA SUSTENNA/234/1.5/SUSP: INVEGA SUSTENNA/234/1.5/SUSP | 30 days supply | Qty: 1 | Fill #0

## 2017-09-27 MED ORDER — PALIPERIDONE PALMITATE 156 MG/ML INTRAMUSCULAR SYRINGE
INJECTION | INTRAMUSCULAR | 3 refills | 0.00000 days | Status: CP
Start: 2017-09-27 — End: 2017-09-27

## 2017-09-27 MED ORDER — PALIPERIDONE PALMITATE 156 MG/ML INTRAMUSCULAR SYRINGE: 156 mg | Syringe | 3 refills | 0 days | Status: AC

## 2017-09-27 MED ORDER — PALIPERIDONE PALMITATE 156 MG/ML INTRAMUSCULAR SYRINGE: mL | 3 refills | 0 days

## 2017-10-01 ENCOUNTER — Institutional Professional Consult (permissible substitution): Payer: Commercial Managed Care - HMO | Admitting: Internal Medicine

## 2017-10-03 MED FILL — INVEGA SUSTENNA/156MG/ML/SUSP: INVEGA SUSTENNA/156MG/ML/SUSP | 28 days supply | Qty: 1 | Fill #0

## 2017-10-04 ENCOUNTER — Institutional Professional Consult (permissible substitution): Admit: 2017-10-04 | Discharge: 2017-10-05 | Payer: MEDICARE | Attending: Psychiatry | Primary: Psychiatry

## 2017-10-04 DIAGNOSIS — R69 Illness, unspecified: Secondary | ICD-10-CM | POA: Diagnosis not present

## 2017-10-04 DIAGNOSIS — F209 Schizophrenia, unspecified: Principal | ICD-10-CM

## 2017-10-04 NOTE — Unmapped (Signed)
Administered long-acting injectable today, 10/04/2017. See MAR for additional documentation. Alverda Skeans, CMA

## 2017-10-14 ENCOUNTER — Encounter: Payer: Self-pay | Admitting: Family Medicine

## 2017-10-14 ENCOUNTER — Other Ambulatory Visit: Payer: Self-pay | Admitting: Family Medicine

## 2017-10-14 ENCOUNTER — Ambulatory Visit (INDEPENDENT_AMBULATORY_CARE_PROVIDER_SITE_OTHER): Payer: Medicare HMO | Admitting: Family Medicine

## 2017-10-14 VITALS — BP 120/70 | HR 110 | Ht 69.0 in | Wt 257.8 lb

## 2017-10-14 DIAGNOSIS — F339 Major depressive disorder, recurrent, unspecified: Secondary | ICD-10-CM

## 2017-10-14 DIAGNOSIS — K2100 Gastro-esophageal reflux disease with esophagitis, without bleeding: Secondary | ICD-10-CM

## 2017-10-14 DIAGNOSIS — R112 Nausea with vomiting, unspecified: Secondary | ICD-10-CM

## 2017-10-14 DIAGNOSIS — Z7689 Persons encountering health services in other specified circumstances: Secondary | ICD-10-CM | POA: Diagnosis not present

## 2017-10-14 DIAGNOSIS — J3089 Other allergic rhinitis: Secondary | ICD-10-CM

## 2017-10-14 DIAGNOSIS — E782 Mixed hyperlipidemia: Secondary | ICD-10-CM | POA: Diagnosis not present

## 2017-10-14 DIAGNOSIS — E1169 Type 2 diabetes mellitus with other specified complication: Secondary | ICD-10-CM | POA: Insufficient documentation

## 2017-10-14 DIAGNOSIS — E785 Hyperlipidemia, unspecified: Secondary | ICD-10-CM | POA: Insufficient documentation

## 2017-10-14 DIAGNOSIS — F209 Schizophrenia, unspecified: Secondary | ICD-10-CM

## 2017-10-14 DIAGNOSIS — R7309 Other abnormal glucose: Secondary | ICD-10-CM

## 2017-10-14 DIAGNOSIS — Z0000 Encounter for general adult medical examination without abnormal findings: Secondary | ICD-10-CM

## 2017-10-14 DIAGNOSIS — J432 Centrilobular emphysema: Secondary | ICD-10-CM

## 2017-10-14 DIAGNOSIS — K219 Gastro-esophageal reflux disease without esophagitis: Secondary | ICD-10-CM | POA: Diagnosis not present

## 2017-10-14 DIAGNOSIS — R69 Illness, unspecified: Secondary | ICD-10-CM | POA: Diagnosis not present

## 2017-10-14 DIAGNOSIS — R7303 Prediabetes: Secondary | ICD-10-CM | POA: Insufficient documentation

## 2017-10-14 DIAGNOSIS — J449 Chronic obstructive pulmonary disease, unspecified: Secondary | ICD-10-CM

## 2017-10-14 DIAGNOSIS — Z79899 Other long term (current) drug therapy: Secondary | ICD-10-CM

## 2017-10-14 MED ORDER — ALBUTEROL SULFATE HFA 108 (90 BASE) MCG/ACT IN AERS: 2.0000 | INHALATION_SPRAY | RESPIRATORY_TRACT | 3 refills | Status: DC | PRN

## 2017-10-14 MED ORDER — ATORVASTATIN CALCIUM 20 MG PO TABS: 20.0000 mg | ORAL_TABLET | Freq: Every day | ORAL | 1 refills | Status: DC

## 2017-10-14 MED ORDER — FLUTICASONE PROPIONATE 50 MCG/ACT NA SUSP: 1.0000 | Freq: Two times a day (BID) | NASAL | 1 refills | Status: DC

## 2017-10-14 MED ORDER — OMEPRAZOLE 20 MG PO CPDR: 20.0000 mg | DELAYED_RELEASE_CAPSULE | Freq: Every day | ORAL | 1 refills | Status: DC

## 2017-10-14 MED ORDER — ONDANSETRON 4 MG PO TBDP: 4.0000 mg | ORAL_TABLET | Freq: Three times a day (TID) | ORAL | 2 refills | Status: DC | PRN

## 2017-10-14 MED ORDER — MONTELUKAST SODIUM 10 MG PO TABS: 10.0000 mg | ORAL_TABLET | Freq: Every day | ORAL | 1 refills | Status: DC

## 2017-10-14 MED ORDER — CETIRIZINE HCL 10 MG PO TABS: 10.0000 mg | ORAL_TABLET | Freq: Every day | ORAL | 0 refills | Status: DC

## 2017-10-14 MED ORDER — MOMETASONE FURO-FORMOTEROL FUM 100-5 MCG/ACT IN AERO: 2.0000 | INHALATION_SPRAY | Freq: Two times a day (BID) | RESPIRATORY_TRACT | 0 refills | Status: DC

## 2017-10-14 NOTE — Assessment & Plan Note (Signed)
Prior elevated lipids Due for next panel with upcoming visit for physical Refill Statin atorvastatin 20mg  for now

## 2017-10-14 NOTE — Assessment & Plan Note (Signed)
Chronic Schizophrenia Followed by [place] clinic On Invega injectable, Fluoxetine, Trazodone

## 2017-10-14 NOTE — Progress Notes (Signed)
Subjective:    Patient ID: Randy [name], male    DOB: 1970-12-24, 47 y.o.   MRN: [id number]  Randy [name] is a 47 y.o. male presenting on 10/14/2017 for Establish Care; Schizophrenia; Gastroesophageal Reflux; and COPD  Patient is accompanied by friend, Sundeep Destin, who provides additional history. Patient provides some history as well.  HPI   Dysphagia Last seen by [facility] 12/2016, for same problem, history of 15 yr progressive difficulty swallowing solids then liquids, worse after history of neck irradiation for NHL. He had barium esophagram that showed some distal esophageal stricture, and xerostomia, poor dentition, chronic chewing tobacco use. Thought to have mixed symptoms from stricture and OP dysphagia. He was continued on PPI, taking Omeprazole 20mg  daily, proceeded with EGD and biopsy/dilatation as needed. Modified barium swallow. EGD performed on 04/12/17 showed esophagitis, non bleeding esophageal ulcer, 2 cm hiatal hernia, mucosal changes concern for Barrett's  PMH - History of Non-Hodgkin's Lymphoma (NHL) s/p neck/chest irradiation  Schizophrenia / Major Depression recurrent Followed by [facility] for specialized Schizophrenia patients, chart reviewed last visit from 06/2017 reviewed, documentation shows patient has history of fetal alcohol syndrome, recurrent major depression and schizophrenia. He has history of positive psychotic symptoms despite high dose risperidone, was changed to Zyprexa, also was on Prozac for while with Trazodone for sleep. Has been approved for Invega long acting injection therapy, last visit his trazodone was increased and melatonin started. - Today he is reporting overall doing well, he is clarifying about meds from [facility], and he has apt in 2 days on 7/24 with new provider, states that the providers rotate through the office and often he has to meet new providers - He has enough meds hopefully to make it until 7/24, otherwise he was advised to call  [facility]  History of COPD / Former smoker now tobacco abuse with smokeless History of OSA [facility] apt with Dr Juanell Fairly scheduled for 11/04/17 it was re-scheduled, he is anticipating further testing for OSA with [facility] and may need CPPA Recently restarted Dulera 2 puffs BID Due for Albuterol inhaler refill  Elevated A1c Elevated last check 6.1 (03/2017), he attributes to his Schizophrenia medicine increasing sugar. He is due for labs at next visit  No flowsheet data found.  Past Medical History:  Diagnosis Date  . Allergy   . COPD (chronic obstructive pulmonary disease) ([place])   . GERD (gastroesophageal reflux disease)    History reviewed. No pertinent surgical history. Social History   Socioeconomic History  . Marital status: Married    Spouse name: Not on file  . Number of children: Not on file  . Years of education: Not on file  . Highest education level: Not on file  Occupational History  . Not on file  Social Needs  . Financial resource strain: Not on file  . Food insecurity:    Worry: Not on file    Inability: Not on file  . Transportation needs:    Medical: Not on file    Non-medical: Not on file  Tobacco Use  . Smoking status: Never Smoker  . Smokeless tobacco: Current User    Types: Chew  Substance and Sexual Activity  . Alcohol use: No  . Drug use: Not on file  . Sexual activity: Not on file  Lifestyle  . Physical activity:    Days per week: Not on file    Minutes per session: Not on file  . Stress: Not on file  Relationships  .  Social connections:    Talks on phone: Not on file    Gets together: Not on file    Attends religious service: Not on file    Active member of club or organization: Not on file    Attends meetings of clubs or organizations: Not on file    Relationship status: Not on file  . Intimate partner violence:    Fear of current or ex partner: Not on file    Emotionally abused: Not on file    Physically abused: Not on file    Forced  sexual activity: Not on file  Other Topics Concern  . Not on file  Social History Narrative  . Not on file   Family History  Problem Relation Age of Onset  . Alcohol abuse Mother   . Cancer Father        colon    Current Outpatient Medications on File Prior to Visit  Medication Sig  . FLUoxetine (PROZAC) 20 MG tablet Take 20 mg by mouth daily.  . traZODone (DESYREL) 100 MG tablet Take 100 mg by mouth at bedtime.   No current facility-administered medications on file prior to visit.     Review of Systems  Constitutional: Negative for activity change, appetite change, chills, diaphoresis, fatigue and fever.  HENT: Negative for congestion and hearing loss.   Eyes: Negative for visual disturbance.  Respiratory: Positive for cough. Negative for chest tightness, shortness of breath and wheezing.   Cardiovascular: Negative for chest pain, palpitations and leg swelling.  Gastrointestinal: Negative for abdominal pain, anal bleeding, blood in stool, constipation, diarrhea, nausea and vomiting.       Dysphagia  Endocrine: Negative for cold intolerance.  Genitourinary: Negative for difficulty urinating, dysuria, frequency and hematuria.  Musculoskeletal: Negative for arthralgias and neck pain.  Skin: Negative for rash.  Allergic/Immunologic: Positive for environmental allergies.  Neurological: Negative for dizziness, weakness, light-headedness, numbness and headaches.  Hematological: Negative for adenopathy.  Psychiatric/Behavioral: Positive for decreased concentration and dysphoric mood. Negative for agitation, behavioral problems and sleep disturbance. The patient is not nervous/anxious.    Per HPI unless specifically indicated above     Objective:    BP 120/70 (BP Location: Right Arm, Patient Position: Sitting, Cuff Size: Large)   Pulse (!) 110   Ht 5\' 9"  (1.753 m)   Wt 257 lb 12.8 oz (116.9 kg)   SpO2 95%   BMI 38.07 kg/m   Wt Readings from Last 3 Encounters:  10/14/17 257  lb 12.8 oz (116.9 kg)  03/20/16 235 lb (106.6 kg)  02/23/16 235 lb (106.6 kg)    Physical Exam  Constitutional: He is oriented to person, place, and time. He appears well-developed and well-nourished. No distress.  Well-appearing, comfortable, cooperative, obese  HENT:  Head: Normocephalic and atraumatic.  Mouth/Throat: Oropharynx is clear and moist.  Eyes: Conjunctivae are normal. Right eye exhibits no discharge. Left eye exhibits no discharge.  Cardiovascular: Normal rate.  Pulmonary/Chest: Effort normal.  Musculoskeletal: He exhibits no edema.  Neurological: He is alert and oriented to person, place, and time.  Skin: Skin is warm and dry. No rash noted. He is not diaphoretic. No erythema.  Psychiatric: His behavior is normal.  Well groomed, good eye contact. Speech appears slightly slower but appropriate. Does not appear anxious. No abnormal movements or behaviors. Relatively poor insight into health and mental health.  Nursing note and vitals reviewed.  No results found for this or any previous visit.    Assessment & Plan:  Problem List Items Addressed This Visit    Chronic recurrent major depressive disorder ([place])    Stable chronic depression, complicated by Schizophrenia Followed by [facility] On Invega injectable, Fluoxetine, Trazodone      Relevant Medications   FLUoxetine (PROZAC) 20 MG tablet   traZODone (DESYREL) 100 MG tablet   COPD (chronic obstructive pulmonary disease) ([facility])    Stable chronic problem with COPD, no clear recent diagnosis Prior PFTs not available anticipated apt with [facility] 8/12 for further eval May continue maintenance inhaler Dulera Refill Albuterol      Relevant Medications   albuterol (PROVENTIL HFA;VENTOLIN HFA) 108 (90 Base) MCG/ACT inhaler   fluticasone (FLONASE) 50 MCG/ACT nasal spray   cetirizine (ZYRTEC) 10 MG tablet   montelukast (SINGULAIR) 10 MG tablet   mometasone-formoterol (DULERA) 100-5 MCG/ACT AERO    Elevated hemoglobin A1c    Secondary to metabolic effects of 2nd gen antipsychotic meds most likely and lifestyle combination Last A1c 6.1 in past 03/2017 Will repeat with upcoming labs Encourage improve lifestyle      GERD (gastroesophageal reflux disease)    Chronic problem, with esophagitis History of PUD Followed by [facility], last EGD 03/2017 Concern with some dysphagia, see prior GI note may benefit from further swallow study Refill Omperazole 20mg  daily adjust dose in future      Relevant Medications   ondansetron (ZOFRAN-ODT) 4 MG disintegrating tablet   omeprazole (PRILOSEC) 20 MG capsule   Hyperlipidemia    Prior elevated lipids Due for next panel with upcoming visit for physical Refill Statin atorvastatin 20mg  for now      Relevant Medications   atorvastatin (LIPITOR) 20 MG tablet   Schizophrenia ([place]) - Primary    Chronic Schizophrenia Followed by [facility] On Invega injectable, Fluoxetine, Trazodone       Other Visit Diagnoses    Nausea and vomiting, intractability of vomiting not specified, unspecified vomiting type       Relevant Medications   ondansetron (ZOFRAN-ODT) 4 MG disintegrating tablet   Encounter to establish care with new doctor     Review outside records and careeverywhere from [facility] and specialists    Environmental and seasonal allergies       Relevant Medications   fluticasone (FLONASE) 50 MCG/ACT nasal spray   cetirizine (ZYRTEC) 10 MG tablet   montelukast (SINGULAIR) 10 MG tablet      Meds ordered this encounter  Medications  . ondansetron (ZOFRAN-ODT) 4 MG disintegrating tablet    Sig: Take 1 tablet (4 mg total) by mouth every 8 (eight) hours as needed for nausea or vomiting.    Dispense:  30 tablet    Refill:  2  . albuterol (PROVENTIL HFA;VENTOLIN HFA) 108 (90 Base) MCG/ACT inhaler    Sig: Inhale 2 puffs into the lungs every 4 (four) hours as needed for wheezing or shortness of breath (cough).    Dispense:  1 Inhaler     Refill:  3  . omeprazole (PRILOSEC) 20 MG capsule    Sig: Take 1 capsule (20 mg total) by mouth daily before breakfast.    Dispense:  90 capsule    Refill:  1  . fluticasone (FLONASE) 50 MCG/ACT nasal spray    Sig: Place 1 spray into both nostrils 2 (two) times daily.    Dispense:  48 g    Refill:  1  . cetirizine (ZYRTEC) 10 MG tablet    Sig: Take 1 tablet (10 mg total) by mouth daily.  Dispense:  30 tablet    Refill:  0  . atorvastatin (LIPITOR) 20 MG tablet    Sig: Take 1 tablet (20 mg total) by mouth daily.    Dispense:  90 tablet    Refill:  1  . montelukast (SINGULAIR) 10 MG tablet    Sig: Take 1 tablet (10 mg total) by mouth at bedtime.    Dispense:  90 tablet    Refill:  1  . mometasone-formoterol (DULERA) 100-5 MCG/ACT AERO    Sig: Inhale 2 puffs into the lungs 2 (two) times daily.    Dispense:  1 Inhaler    Refill:  0    Follow up plan: Return in about 1 month (around 11/11/2017) for Annual Physical.  Future labs ordered for 11/07/17  [name], [facility] 10/14/2017, 11:04 PM

## 2017-10-14 NOTE — Assessment & Plan Note (Signed)
Chronic problem, with esophagitis History of PUD Followed by [facility], last EGD 03/2017 Concern with some dysphagia, see prior GI note may benefit from further swallow study Refill Omperazole 20mg  daily adjust dose in future

## 2017-10-14 NOTE — Assessment & Plan Note (Signed)
Secondary to metabolic effects of 2nd gen antipsychotic meds most likely and lifestyle combination Last A1c 6.1 in past 03/2017 Will repeat with upcoming labs Encourage improve lifestyle

## 2017-10-14 NOTE — Assessment & Plan Note (Signed)
Stable chronic depression, complicated by Schizophrenia Followed by [facility] On Invega injectable, Fluoxetine, Trazodone 

## 2017-10-14 NOTE — Assessment & Plan Note (Signed)
Stable chronic problem with COPD, no clear recent diagnosis Prior PFTs not available anticipated apt with [facility] 8/12 for further eval May continue maintenance inhaler Dulera Refill Albuterol

## 2017-10-14 NOTE — Patient Instructions (Addendum)
Thank you for coming to the office today.  Next apt with [facility] is Wednesday 24 - call them to check status if you need any med refills.  All rx refilled otherwise - let me know if any med missing.  DUE for FASTING BLOOD WORK (no food or drink after midnight before the lab appointment, only water or coffee without cream/sugar on the morning of)  SCHEDULE "Lab Only" visit in the morning at the clinic for lab draw in 4-6 weeks   - Make sure Lab Only appointment is at about 1 week before your next appointment, so that results will be available  For Lab Results, once available within 2-3 days of blood draw, you can can log in to MyChart online to view your results and a brief explanation. Also, we can discuss results at next follow-up visit.   Please schedule a Follow-up Appointment to: Return in about 1 month (around 11/11/2017) for Annual Physical.  If you have any other questions or concerns, please feel free to call the office or send a message through [place]. You may also schedule an earlier appointment if necessary.  Additionally, you may be receiving a survey about your experience at our office within a few days to 1 week by e-mail or mail. We value your feedback.  [name], DO [place]

## 2017-10-15 ENCOUNTER — Other Ambulatory Visit: Payer: Self-pay | Admitting: Family Medicine

## 2017-10-15 DIAGNOSIS — J3089 Other allergic rhinitis: Secondary | ICD-10-CM

## 2017-10-15 MED ORDER — ALBUTEROL SULFATE HFA 90 MCG/ACTUATION AEROSOL INHALER: 2 | Inhaler | Freq: Four times a day (QID) | 2 refills | 0 days | Status: AC

## 2017-10-15 MED ORDER — ALBUTEROL SULFATE HFA 90 MCG/ACTUATION AEROSOL INHALER
Freq: Four times a day (QID) | RESPIRATORY_TRACT | 2 refills | 0.00000 days | Status: CP | PRN
Start: 2017-10-15 — End: 2017-10-15
  Filled 2017-11-13: qty 54, 75d supply, fill #0

## 2017-10-15 MED ORDER — IPRATROPIUM BROMIDE 17 MCG/ACTUATION HFA AEROSOL INHALER: 2 | Inhaler | Freq: Four times a day (QID) | 2 refills | 0 days | Status: AC

## 2017-10-15 MED ORDER — IPRATROPIUM BROMIDE 17 MCG/ACTUATION HFA AEROSOL INHALER
Freq: Four times a day (QID) | RESPIRATORY_TRACT | 2 refills | 0.00000 days | Status: CP
Start: 2017-10-15 — End: 2017-10-15

## 2017-10-15 MED ORDER — MOMETASONE-FORMOTEROL HFA 200 MCG-5 MCG/ACTUATION AEROSOL INHALER
Freq: Two times a day (BID) | RESPIRATORY_TRACT | 2 refills | 0 days | Status: CP
Start: 2017-10-15 — End: 2017-10-15

## 2017-10-15 MED ORDER — MOMETASONE-FORMOTEROL HFA 200 MCG-5 MCG/ACTUATION AEROSOL INHALER: 2 | g | 2 refills | 0 days

## 2017-10-16 ENCOUNTER — Ambulatory Visit
Admit: 2017-10-16 | Discharge: 2017-10-17 | Payer: MEDICARE | Attending: Student in an Organized Health Care Education/Training Program | Primary: Student in an Organized Health Care Education/Training Program

## 2017-10-16 DIAGNOSIS — R69 Illness, unspecified: Secondary | ICD-10-CM | POA: Diagnosis not present

## 2017-10-16 DIAGNOSIS — F3341 Major depressive disorder, recurrent, in partial remission: Secondary | ICD-10-CM | POA: Diagnosis not present

## 2017-10-16 DIAGNOSIS — Z79899 Other long term (current) drug therapy: Secondary | ICD-10-CM | POA: Diagnosis not present

## 2017-10-16 DIAGNOSIS — F209 Schizophrenia, unspecified: Principal | ICD-10-CM
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
## 2017-10-16 NOTE — Unmapped (Signed)
I saw and evaluated the patient, participating in the key portions of the service.  I reviewed the resident???s note.  I agree with the resident???s findings and plan. Catalina Pizza, MD

## 2017-10-16 NOTE — Unmapped (Addendum)
Medications: DECREASE your Olanzapine (Zyprexa) by half (take half a tablet, 5mg ) for one week, then discontinue if tolerating decrease after one week. If notice reemergence of any symptoms (hallucinations, irritability, low mood) then please call to discuss.    Follow-up: Next INJ scheduled for Friday, 11/01/2017. Next follow-up appointment scheduled for Wednesday, September 4th.    Follow-up instructions:  -- Please continue taking your medications as prescribed for your mental health.   -- Do not make changes to your medications, including taking more or less than prescribed, unless under the supervision of your physician. Be aware that some medications may make you feel worse if abruptly stopped  -- Please refrain from using illicit substances, as these can affect your mood and could cause anxiety or other concerning symptoms.   -- Seek further medical care for any increase in symptoms or new symptoms such as thoughts of wanting to hurt yourself or hurt others.     Contact info:  Life-threatening emergencies: call 911 or go to the nearest ER for medical or psychiatric attention.     Issues that need urgent attention but are not life threatening: call the clinic at [phone] or the outpatient nurses line [phone] for assistance.     Non-urgent routine concerns, questions, and refill requests: please call [phone] for assistance.     Regarding appointments:  - If you need to cancel your appointment, we ask that you call [phone] at least 24 hours before your scheduled appointment.  - If for any reason you arrive 15 minutes later than your scheduled appointment time, you may not be seen and your visit may be rescheduled.  - Please remember that we will not automatically reschedule missed appointments.  - If you miss two (2) appointments without letting us know in advance, you will likely be referred to a provider in your community.  - We will do our best to be on time. Sometimes an emergency will arise that might cause your clinician to be late. We will try to inform you of this when you check in for your appointment. If you wait more than 15 minutes past your appointment time without such notice, please speak with the front desk staff.    In the event of bad weather, the clinic staff will attempt to contact you, should your appointment need to be rescheduled. Additionally, you can call the Patient Weather Line [phone] for system-wide clinic status    For more information and reminders regarding clinic policies (these were provided when you were admitted to the clinic), please ask the front desk.

## 2017-10-17 MED FILL — DULERA/200-5MCG/AERO: DULERA/200-5MCG/AERO | 90 days supply | Qty: 3 | Fill #2

## 2017-10-17 MED FILL — ALBUTEROL HFA/90MCG/AER: ALBUTEROL HFA/90MCG/AER | 25 days supply | Qty: 1 | Fill #4

## 2017-10-30 ENCOUNTER — Encounter: Payer: Self-pay | Admitting: Family Medicine

## 2017-10-30 ENCOUNTER — Ambulatory Visit (INDEPENDENT_AMBULATORY_CARE_PROVIDER_SITE_OTHER): Payer: Medicare HMO | Admitting: Family Medicine

## 2017-10-30 VITALS — BP 125/70 | HR 81 | Temp 98.7°F | Resp 16 | Ht 69.0 in | Wt 259.0 lb

## 2017-10-30 DIAGNOSIS — L03811 Cellulitis of head [any part, except face]: Secondary | ICD-10-CM

## 2017-10-30 MED ORDER — MUPIROCIN 2 % EX OINT: 1.0000 "application " | TOPICAL_OINTMENT | Freq: Two times a day (BID) | CUTANEOUS | 0 refills | Status: DC

## 2017-10-30 MED ORDER — DOXYCYCLINE HYCLATE 100 MG PO TABS
100.0000 mg | ORAL_TABLET | Freq: Two times a day (BID) | ORAL | 0 refills | Status: DC
Start: 2017-10-30 — End: 2017-11-14

## 2017-10-30 NOTE — Progress Notes (Signed)
Subjective:    Patient ID: Randy [name], male    DOB: [birth year], 47 y.o.   MRN: [id number]  Randy [name] is a 47 y.o. male presenting on 10/30/2017 for Mass (on head onset week but changing size as per patient, throbbing pain onset yesterday)  Patient presents for a same day appointment.  HPI   CELLULITIS ULCERATION SCALP Reports about 1 week ago first onset on top of scalp he noticed a "pimple or bump" and thought it was an ingrown hair at first, and he stated that he "messed with it" and scratched it and manipulated it and did not have any drainage of pus or blood or fluid. It did not improve. He used topical neosporin without improvement. Now significantly worsened last night, felt more symptoms of throbbing pain in scalp and he had some headache, thinks it is a bit more swollen, he went to see his ex-wife who looked at it and advised him to come here to get it checked out. - He took a dose of Tylenol about 4-5 hours ago, seems to help control some throb and ache and temp - Admits some fevers and chills by his report, no documented fever - Admits headache - Denies any other rash or spots on skin, erythema extending, nausea vomiting   Depression screen PHQ 2/9 10/30/2017  Decreased Interest 0  Down, Depressed, Hopeless 0  PHQ - 2 Score 0    Social History   Tobacco Use  . Smoking status: Former Research scientist (life sciences)  . Smokeless tobacco: Current User    Types: Chew  Substance Use Topics  . Alcohol use: No  . Drug use: Never    Review of Systems Per HPI unless specifically indicated above     Objective:    BP 125/70   Pulse 81   Temp 98.7 F (37.1 C) (Oral)   Resp 16   Ht 5\' 9"  (1.753 m)   Wt 259 lb (117.5 kg)   BMI 38.25 kg/m   Wt Readings from Last 3 Encounters:  10/30/17 259 lb (117.5 kg)  10/14/17 257 lb 12.8 oz (116.9 kg)  03/20/16 235 lb (106.6 kg)    Physical Exam  Constitutional: He is oriented to person, place, and time. He appears well-developed and  well-nourished. No distress.  Well-appearing, comfortable, cooperative  HENT:  Head: Normocephalic and atraumatic.  Mouth/Throat: Oropharynx is clear and moist.  Eyes: Conjunctivae are normal. Right eye exhibits no discharge. Left eye exhibits no discharge.  Cardiovascular: Normal rate.  Pulmonary/Chest: Effort normal.  Neurological: He is alert and oriented to person, place, and time.  Skin: Skin is warm and dry. No rash noted. He is not diaphoretic. There is erythema.  Top of scalp - Superficial abrasion about x 2 cm region, with some denuded skin, surrounding erythema and mild localized edema, tender to touch, no obvious fluctuance or drainage of pus or oozing or bleeding  Psychiatric: He has a normal mood and affect. His behavior is normal.  Well groomed, good eye contact, normal speech and thoughts  Nursing note and vitals reviewed.   Top of scalp     No results found for this or any previous visit.    Assessment & Plan:   Problem List Items Addressed This Visit    None    Visit Diagnoses    Abscess or cellulitis of scalp    -  Primary   Relevant Medications   doxycycline (VIBRA-TABS) 100 MG tablet   mupirocin ointment (BACTROBAN) 2 %  Consistent with new acute scalp superficial ulceration vs cellulitis with some firm induration without fluctuance and without obvious abscess formation but still has deeper tissue pain and swelling. No recent antibiotics.  Plan: 1. Start Doxycycline 100mg  BID wc for 10 days - precautions given 2. Also provide rx Mupirocin topical antibiotic BID for 1-2 weeks as skin protectant 3. Hyriene and Warm compresses per handout 4. Strict Return precautions given if worsening  Future if non healing ulceration may consider referral to Dermatology for 2nd opinion given sun exposed area on top of scalp  Meds ordered this encounter  Medications  . doxycycline (VIBRA-TABS) 100 MG tablet    Sig: Take 1 tablet (100 mg total) by mouth 2 (two)  times daily. For 10 days. Take with full glass of water, stay upright 30 min after taking.    Dispense:  20 tablet    Refill:  0  . mupirocin ointment (BACTROBAN) 2 %    Sig: Apply 1 application topically 2 (two) times daily. Apply to scalp for up to 1-2 weeks    Dispense:  30 g    Refill:  0    Follow up plan: Return in about 1 week (around 11/06/2017), or if symptoms worsen or fail to improve, for scalp infection.   [name], [facility] 10/30/2017, 2:30 PM

## 2017-10-30 NOTE — Patient Instructions (Addendum)
Thank you for coming to the office today.  You have an abscess, which is a localized bacterial infection in deeper layers of skin.  As discussed today, it did not seem like it would require drainage in office.  Start taking antibiotic as prescribed, Doxycycline 100mg  twice daily for 10 days, make sure to take with full glass of water and stay upright or seated for 30 min (do not lay down after taking or can cause burning irritation of throat).  Also use Mupirocin (Bactroban) for topical ointment to help the infection - use this TWICE a day for 1-2 weeks Stop neosporin  Recommend warm soapy water with warm compresses/wash cloth several times a day to help heal and may actually drain some pus on its own, which can be normal.   Recommend to start taking Tylenol Extra Strength 500mg  tabs - take 1 to 2 tabs per dose (max 1000mg ) every 6-8 hours for pain (take regularly, don't skip a dose for next 7 days), max 24 hour daily dose is 6 tablets or 3000mg . In the future you can repeat the same everyday Tylenol course for 1-2 weeks at a time.   May also take Ibuprofen 400 to 600mg  up to 3 times a day as needed for pain  You may need to return soon for re-evaluation if worsening such as spreading redness or streaking redness, significantly larger size, increased pain, fevers/chills, nausea vomiting and cannot take antibiotic. If significantly worse symptoms or most of these symptoms, would recommend going straight to Hospital Emergency Dept as you may require IV antibiotics instead.   If not improving you may need to return for re-evaluation. But if more severe worsening such as spreading redness or streaking redness, significantly larger size, persistent drainage of pus, increased pain, fevers/chills, nausea vomiting and cannot take antibiotic. If significantly worse symptoms or most of these symptoms, would recommend going straight to Hospital Emergency Dept as you may require IV antibiotics  instead.  Please schedule a Follow-up Appointment to: Return in about 1 week (around 11/06/2017), or if symptoms worsen or fail to improve, for scalp infection.  If you have any other questions or concerns, please feel free to call the office or send a message through [place]. You may also schedule an earlier appointment if necessary.  Additionally, you may be receiving a survey about your experience at our office within a few days to 1 week by e-mail or mail. We value your feedback.  [name], DO [place]

## 2017-10-31 MED FILL — INVEGA SUSTENNA/156MG/ML/SUSP: INVEGA SUSTENNA/156MG/ML/SUSP | 28 days supply | Qty: 1 | Fill #1

## 2017-11-01 ENCOUNTER — Institutional Professional Consult (permissible substitution): Admit: 2017-11-01 | Discharge: 2017-11-02 | Payer: MEDICARE | Attending: Psychiatry | Primary: Psychiatry

## 2017-11-01 DIAGNOSIS — R69 Illness, unspecified: Secondary | ICD-10-CM | POA: Diagnosis not present

## 2017-11-01 DIAGNOSIS — F209 Schizophrenia, unspecified: Principal | ICD-10-CM

## 2017-11-01 NOTE — Unmapped (Signed)
Administered long-acting injectable today, 11/01/2017. See MAR for additional documentation. Nicklous Aburto Orson Ape freitas, CMA

## 2017-11-04 ENCOUNTER — Ambulatory Visit (INDEPENDENT_AMBULATORY_CARE_PROVIDER_SITE_OTHER): Payer: Medicare HMO | Admitting: Internal Medicine

## 2017-11-04 ENCOUNTER — Encounter: Payer: Self-pay | Admitting: Internal Medicine

## 2017-11-04 VITALS — BP 112/82 | HR 101 | Resp 16 | Ht 69.0 in | Wt 259.0 lb

## 2017-11-04 DIAGNOSIS — J449 Chronic obstructive pulmonary disease, unspecified: Secondary | ICD-10-CM

## 2017-11-04 DIAGNOSIS — R0600 Dyspnea, unspecified: Secondary | ICD-10-CM

## 2017-11-04 DIAGNOSIS — R0609 Other forms of dyspnea: Secondary | ICD-10-CM | POA: Diagnosis not present

## 2017-11-04 DIAGNOSIS — G4719 Other hypersomnia: Secondary | ICD-10-CM | POA: Diagnosis not present

## 2017-11-04 MED ORDER — ALBUTEROL SULFATE HFA 108 (90 BASE) MCG/ACT IN AERS: 1.0000 | INHALATION_SPRAY | Freq: Four times a day (QID) | RESPIRATORY_TRACT | 5 refills | Status: DC | PRN

## 2017-11-04 MED ORDER — CETIRIZINE HCL 10 MG PO TABS: 10.0000 mg | ORAL_TABLET | Freq: Every day | ORAL | 11 refills | Status: DC

## 2017-11-04 MED ORDER — CETIRIZINE 10 MG TABLET
Freq: Every day | ORAL | 0 days
Start: 2017-11-04 — End: ?

## 2017-11-04 NOTE — Addendum Note (Signed)
Addended by: Olin Hauser on: 11/04/2017 05:13 PM   Modules accepted: Orders

## 2017-11-04 NOTE — Progress Notes (Signed)
[facility] Consultation      Assessment and Plan:  OSA -Excessive daytime sleepiness, symptoms and signs of obstructive sleep apnea, with previous diagnosis of obstructive sleep apnea more than 10 years ago, CPAP was taken away. - We will send for new sleep study.  Insomnia. - Currently on trazodone 100 mg nightly to continue for the time being. - We will see if his symptoms improve with initiation of CPAP.  COPD.  - Per patient he has a diagnosis of COPD, has dyspnea on exertion, former smoker. -He is using Dulera and inappropriately, 3-4 times daily, was warned to use only 2 puffs twice daily, prescribed a rescue inhaler to be used as needed during the day.  We will also send for a pulmonary function test.  Orders Placed This Encounter  Procedures  . Pulmonary Function Test [facility] Only  . Home sleep test   Meds ordered this encounter  Medications  . albuterol (PROAIR HFA) 108 (90 Base) MCG/ACT inhaler    Sig: Inhale 1-2 puffs into the lungs every 6 (six) hours as needed for wheezing or shortness of breath.    Dispense:  1 Inhaler    Refill:  5   Return in about 3 months (around 02/04/2018).    Date: 11/04/2017  MRN# [id number] Randy [name] [birth year]   Randy [name] is a 47 y.o. old male seen in consultation for chief complaint of:    Chief Complaint  Patient presents with  . Consult    Self referral: pt has trouble going to sleep and staying asleep. He had a sleep study done over 10 years ago.    HPI:   He has been having trouble sleeping through the night, he snores at night and he stops breathing in his sleep. This has been going on for about several years and more severe over the past 2 months. He had a sleep study about 11 years ago, he was started on CPAP, but he stopped after 3 months due insurance no longer paying for it. He thinks that he tolerated it well back then.  He has gained about 30 lbs in the past few years.  He takes trazodone  100 mg at 9pm which he has been on for several years, he thinks that it used to help but no longer.   He has been diagnosed with COPD and notes that he has dyspnea on exertion. He takes dulera 2 puffs about 4 times per day.   PMHX:   Past Medical History:  Diagnosis Date  . Allergy   . COPD (chronic obstructive pulmonary disease) ([place])   . GERD (gastroesophageal reflux disease)    Surgical Hx:  History reviewed. No pertinent surgical history. Family Hx:  Family History  Problem Relation Age of Onset  . Alcohol abuse Mother   . Cancer Father        colon    Social Hx:   Social History   Tobacco Use  . Smoking status: Former Research scientist (life sciences)  . Smokeless tobacco: Current User    Types: Chew  Substance Use Topics  . Alcohol use: No  . Drug use: Never   Medication:    Current Outpatient Medications:  .  albuterol (PROVENTIL HFA;VENTOLIN HFA) 108 (90 Base) MCG/ACT inhaler, Inhale 2 puffs into the lungs every 4 (four) hours as needed for wheezing or shortness of breath (cough)., Disp: 1 Inhaler, Rfl: 3 .  atorvastatin (LIPITOR) 20 MG tablet, Take 1 tablet (20 mg total) by mouth  daily., Disp: 90 tablet, Rfl: 1 .  carbamide peroxide (DEBROX) 6.5 % OTIC solution, Administer 5 drops into both ears Two (2) times a day., Disp: , Rfl:  .  cetirizine (ZYRTEC) 10 MG tablet, Take 1 tablet (10 mg total) by mouth daily., Disp: 30 tablet, Rfl: 0 .  doxycycline (VIBRA-TABS) 100 MG tablet, Take 1 tablet (100 mg total) by mouth 2 (two) times daily. For 10 days. Take with full glass of water, stay upright 30 min after taking., Disp: 20 tablet, Rfl: 0 .  FLUoxetine (PROZAC) 20 MG tablet, Take 20 mg by mouth daily., Disp: , Rfl:  .  fluticasone (FLONASE) 50 MCG/ACT nasal spray, Place 1 spray into both nostrils 2 (two) times daily., Disp: 48 g, Rfl: 1 .  Melatonin 3 MG TABS, Take by mouth., Disp: , Rfl:  .  mometasone-formoterol (DULERA) 100-5 MCG/ACT AERO, Inhale 2 puffs into the lungs 2 (two) times daily.,  Disp: 1 Inhaler, Rfl: 0 .  montelukast (SINGULAIR) 10 MG tablet, Take 1 tablet (10 mg total) by mouth at bedtime., Disp: 90 tablet, Rfl: 1 .  mupirocin ointment (BACTROBAN) 2 %, Apply 1 application topically 2 (two) times daily. Apply to scalp for up to 1-2 weeks, Disp: 30 g, Rfl: 0 .  OLANZapine (ZYPREXA) 20 MG tablet, Take by mouth., Disp: , Rfl:  .  omeprazole (PRILOSEC) 20 MG capsule, Take 1 capsule (20 mg total) by mouth daily before breakfast., Disp: 90 capsule, Rfl: 1 .  ondansetron (ZOFRAN-ODT) 4 MG disintegrating tablet, Take 1 tablet (4 mg total) by mouth every 8 (eight) hours as needed for nausea or vomiting., Disp: 30 tablet, Rfl: 2 .  paliperidone (INVEGA SUSTENNA) 156 MG/ML SUSY injection, INJECT THE CONTENTS OF 1 SYRINGE (156 MG) INTRAMUSCULARLY EVERY 28 DAYS, Disp: , Rfl:  .  traZODone (DESYREL) 100 MG tablet, Take 100 mg by mouth at bedtime., Disp: , Rfl:    Allergies:  Patient has no known allergies.  Review of Systems: Gen:  Denies  fever, sweats, chills HEENT: Denies blurred vision, double vision. bleeds, sore throat Cvc:  No dizziness, chest pain. Resp:   Denies cough or sputum production, shortness of breath Gi: Denies swallowing difficulty, stomach pain. Gu:  Denies bladder incontinence, burning urine Ext:   No Joint pain, stiffness. Skin: No skin rash,  hives  Endoc:  No polyuria, polydipsia. Psych: No depression, insomnia. Other:  All other systems were reviewed with the patient and were negative other that what is mentioned in the HPI.   Physical Examination:   VS: BP 112/82 (BP Location: Left Arm, Cuff Size: Large)   Pulse (!) 101   Resp 16   Ht 5\' 9"  (1.753 m)   Wt 259 lb (117.5 kg)   SpO2 95%   BMI 38.25 kg/m   General Appearance: No distress  Neuro:without focal findings,  speech normal,  HEENT: PERRLA, EOM intact.   Pulmonary: normal breath sounds, No wheezing.  CardiovascularNormal S1,S2.  No m/r/g.   Abdomen: Benign, Soft, non-tender. Renal:   No costovertebral tenderness  GU:  No performed at this time. Endoc: No evident thyromegaly, no signs of acromegaly. Skin:   warm, no rashes, no ecchymosis  Extremities: normal, no cyanosis, clubbing.  Other findings:    LABORATORY PANEL:   CBC No results for input(s): WBC, HGB, HCT, PLT in the last 168 hours. ------------------------------------------------------------------------------------------------------------------  Chemistries  No results for input(s): NA, K, CL, CO2, GLUCOSE, BUN, CREATININE, CALCIUM, MG, AST, ALT, ALKPHOS, BILITOT in the last  168 hours.  Invalid input(s): GFRCGP ------------------------------------------------------------------------------------------------------------------  Cardiac Enzymes No results for input(s): TROPONINI in the last 168 hours. ------------------------------------------------------------  RADIOLOGY:  No results found.     Thank  you for the consultation and for allowing [facility] to assist in the care of your patient. Our recommendations are noted above.  Please contact us if we can be of further service.   Marda Stalker, M.D., F.C.C.P.  Board Certified in Internal Medicine, Pulmonary Medicine, [place], and Sleep Medicine.  [facility] Office Number: [phone]   11/04/2017

## 2017-11-04 NOTE — Patient Instructions (Addendum)
Will send for sleep study.   Use dulera 2 puffs twice daily, rinse mouth after use. Do not use more than twice per day.  Will prescribe albuterol (proair) inhaler to be used every 4-6 hours. Will send you for lung function test.    Sleep Apnea    Sleep apnea is disorder that affects a person's sleep. A person with sleep apnea has abnormal pauses in their breathing when they sleep. It is hard for them to get a good sleep. This makes a person tired during the day. It also can lead to other physical problems. There are three types of sleep apnea. One type is when breathing stops for a short time because your airway is blocked (obstructive sleep apnea). Another type is when the brain sometimes fails to give the normal signal to breathe to the muscles that control your breathing (central sleep apnea). The third type is a combination of the other two types.  HOME CARE   Take all medicine as told by your doctor.  Avoid alcohol, calming medicines (sedatives), and depressant drugs.  Try to lose weight if you are overweight. Talk to your doctor about a healthy weight goal.  Your doctor may have you use a device that helps to open your airway. It can help you get the air that you need. It is called a positive airway pressure (PAP) device.   MAKE SURE YOU:   Understand these instructions.  Will watch your condition.  Will get help right away if you are not doing well or get worse.  It may take approximately 1 month for you to get used to wearing her CPAP every night.  Be sure to work with your machine to get used to it, be patient, it may take time!  If you have trouble tolerating CPAP DO NOT RETURN YOUR MACHINE; Contact our office to see if we can help you tolerate the CPAP better first!

## 2017-11-07 ENCOUNTER — Other Ambulatory Visit: Payer: Medicare HMO

## 2017-11-07 DIAGNOSIS — R7309 Other abnormal glucose: Secondary | ICD-10-CM | POA: Diagnosis not present

## 2017-11-07 DIAGNOSIS — E782 Mixed hyperlipidemia: Secondary | ICD-10-CM | POA: Diagnosis not present

## 2017-11-07 DIAGNOSIS — Z79899 Other long term (current) drug therapy: Secondary | ICD-10-CM | POA: Diagnosis not present

## 2017-11-07 DIAGNOSIS — R69 Illness, unspecified: Secondary | ICD-10-CM | POA: Diagnosis not present

## 2017-11-07 DIAGNOSIS — Z0000 Encounter for general adult medical examination without abnormal findings: Secondary | ICD-10-CM | POA: Diagnosis not present

## 2017-11-08 LAB — COMPLETE METABOLIC PANEL WITH GFR
AG RATIO: 1.5 (calc) (ref 1.0–2.5)
ALT: 44 U/L (ref 9–46)
AST: 33 U/L (ref 10–40)
Albumin: 4.4 g/dL (ref 3.6–5.1)
Alkaline phosphatase (APISO): 65 U/L (ref 40–115)
BUN: 21 mg/dL (ref 7–25)
CALCIUM: 9.8 mg/dL (ref 8.6–10.3)
CO2: 28 mmol/L (ref 20–32)
Chloride: 100 mmol/L (ref 98–110)
Creat: 1.09 mg/dL (ref 0.60–1.35)
GFR, EST AFRICAN AMERICAN: 93 mL/min/{1.73_m2} (ref >=60)
GFR, EST NON AFRICAN AMERICAN: 80 mL/min/{1.73_m2} (ref >=60)
GLOBULIN: 2.9 g/dL (ref 1.9–3.7)
Glucose, Bld: 104 mg/dL — ABNORMAL HIGH (ref 65–99)
POTASSIUM: 4.1 mmol/L (ref 3.5–5.3)
SODIUM: 138 mmol/L (ref 135–146)
TOTAL PROTEIN: 7.3 g/dL (ref 6.1–8.1)
Total Bilirubin: 0.5 mg/dL (ref 0.2–1.2)

## 2017-11-08 LAB — CBC WITH DIFFERENTIAL/PLATELET
BASOS ABS: 29 {cells}/uL (ref 0–200)
Basophils Relative: 0.6 %
EOS ABS: 62 {cells}/uL (ref 15–500)
Eosinophils Relative: 1.3 %
HCT: 40 % (ref 38.5–50.0)
HEMOGLOBIN: 13.5 g/dL (ref 13.2–17.1)
Lymphs Abs: 1598 cells/uL (ref 850–3900)
MCH: 29.9 pg (ref 27.0–33.0)
MCHC: 33.8 g/dL (ref 32.0–36.0)
MCV: 88.7 fL (ref 80.0–100.0)
MONOS PCT: 8 %
MPV: 12.1 fL (ref 7.5–12.5)
NEUTROS PCT: 56.8 %
Neutro Abs: 2726 cells/uL (ref 1500–7800)
Platelets: 178 10*3/uL (ref 140–400)
RBC: 4.51 10*6/uL (ref 4.20–5.80)
RDW: 14.1 % (ref 11.0–15.0)
TOTAL LYMPHOCYTE: 33.3 %
WBC mixed population: 384 cells/uL (ref 200–950)
WBC: 4.8 10*3/uL (ref 3.8–10.8)

## 2017-11-08 LAB — HEMOGLOBIN A1C
HEMOGLOBIN A1C: 6.1 %{Hb} — AB (ref <5.7)
Mean Plasma Glucose: 128 (calc)
eAG (mmol/L): 7.1 (calc)

## 2017-11-08 LAB — LIPID PANEL
CHOLESTEROL: 175 mg/dL (ref <200)
HDL: 52 mg/dL (ref >40)
LDL Cholesterol (Calc): 87 mg/dL (calc)
Non-HDL Cholesterol (Calc): 123 mg/dL (calc) (ref <130)
TRIGLYCERIDES: 301 mg/dL — AB (ref <150)
Total CHOL/HDL Ratio: 3.4 (calc) (ref <5.0)

## 2017-11-08 LAB — TSH: TSH: 2.08 mIU/L (ref 0.40–4.50)

## 2017-11-08 LAB — T4, FREE: FREE T4: 0.9 ng/dL (ref 0.8–1.8)

## 2017-11-12 NOTE — Unmapped (Signed)
Denied refill request of PO Olanzapine 20mg  tablets. Plan following last appointment was to continue taper of medication with discontinuation given initiation of LAI.

## 2017-11-13 ENCOUNTER — Telehealth: Payer: Self-pay | Admitting: Family Medicine

## 2017-11-13 MED FILL — FLUTICASONE PROPIONATE 50 MCG/ACTUATION NASAL SPRAY,SUSPENSION: 90 days supply | Qty: 48 | Fill #0 | Status: AC

## 2017-11-13 MED FILL — TRAZODONE 100 MG TABLET: 15 days supply | Qty: 30 | Fill #0 | Status: AC

## 2017-11-13 MED FILL — OMEPRAZOLE 20 MG CAPSULE,DELAYED RELEASE: ORAL | 90 days supply | Qty: 180 | Fill #0

## 2017-11-13 MED FILL — FLUOXETINE 20 MG CAPSULE: 30 days supply | Qty: 90 | Fill #0 | Status: AC

## 2017-11-13 MED FILL — ATORVASTATIN 20 MG TABLET: ORAL | 90 days supply | Qty: 90 | Fill #0

## 2017-11-13 MED FILL — OMEPRAZOLE 20 MG CAPSULE,DELAYED RELEASE: 90 days supply | Qty: 180 | Fill #0 | Status: AC

## 2017-11-13 MED FILL — FLUTICASONE PROPIONATE 50 MCG/ACTUATION NASAL SPRAY,SUSPENSION: 90 days supply | Qty: 48 | Fill #0

## 2017-11-13 MED FILL — ATORVASTATIN 20 MG TABLET: 90 days supply | Qty: 90 | Fill #0 | Status: AC

## 2017-11-13 MED FILL — TRAZODONE 100 MG TABLET: 15 days supply | Qty: 30 | Fill #0

## 2017-11-13 MED FILL — FLUOXETINE 20 MG CAPSULE: 30 days supply | Qty: 90 | Fill #0

## 2017-11-13 MED FILL — ALBUTEROL SULFATE HFA 90 MCG/ACTUATION AEROSOL INHALER: 75 days supply | Qty: 54 | Fill #0 | Status: AC

## 2017-11-13 NOTE — Telephone Encounter (Signed)
Received phone call requesting inhalers sent to [facility]. I am not familiar with this, we can review in office tomorrow as scheduled. He has a Clinical cytogeneticist already that may be able to rx these inhalers.  [name], [facility] 11/13/2017, 12:10 PM

## 2017-11-14 ENCOUNTER — Telehealth: Payer: Self-pay | Admitting: Family Medicine

## 2017-11-14 ENCOUNTER — Encounter: Payer: Self-pay | Admitting: Family Medicine

## 2017-11-14 ENCOUNTER — Ambulatory Visit (INDEPENDENT_AMBULATORY_CARE_PROVIDER_SITE_OTHER): Payer: Medicare HMO | Admitting: Family Medicine

## 2017-11-14 VITALS — BP 111/63 | HR 80 | Temp 98.5°F | Resp 16 | Ht 69.0 in | Wt 260.0 lb

## 2017-11-14 DIAGNOSIS — F339 Major depressive disorder, recurrent, unspecified: Secondary | ICD-10-CM

## 2017-11-14 DIAGNOSIS — F209 Schizophrenia, unspecified: Secondary | ICD-10-CM | POA: Diagnosis not present

## 2017-11-14 DIAGNOSIS — Z0000 Encounter for general adult medical examination without abnormal findings: Secondary | ICD-10-CM | POA: Diagnosis not present

## 2017-11-14 DIAGNOSIS — R69 Illness, unspecified: Secondary | ICD-10-CM | POA: Diagnosis not present

## 2017-11-14 DIAGNOSIS — E782 Mixed hyperlipidemia: Secondary | ICD-10-CM | POA: Diagnosis not present

## 2017-11-14 DIAGNOSIS — R7309 Other abnormal glucose: Secondary | ICD-10-CM

## 2017-11-14 DIAGNOSIS — J449 Chronic obstructive pulmonary disease, unspecified: Secondary | ICD-10-CM | POA: Diagnosis not present

## 2017-11-14 DIAGNOSIS — E669 Obesity, unspecified: Secondary | ICD-10-CM | POA: Insufficient documentation

## 2017-11-14 NOTE — Assessment & Plan Note (Signed)
Weight stable Encourage improve lifestyle, diet, exercise Goal wt loss

## 2017-11-14 NOTE — Assessment & Plan Note (Addendum)
Stable with A1c 6.1 in past 7 months Secondary to metabolic effects of 2nd gen antipsychotic meds most likely  and lifestyle combination Defer medications at this time. Future may consider Metformin vs GLP1 Encourage improve lifestyle, exercise - Handout given glycemic index, low carb diet options Follow-up q 6 month PreDM A1c

## 2017-11-14 NOTE — Telephone Encounter (Signed)
I saw patient today for annual physical. It was requested to have rx for [facility] sent to [facility].  I looked into this, and as a non [facility] provider, it looks like I cannot send rx to this pharmacy for assistance.  He may instead try to contact our [facility] Medication Assistance Program (MAP) locally to see if he will qualify for rx assistance through them.  Please notify him of this update and may copy information below this line to mail as a letter for him to have a written copy of this as well.  [name], [facility] 11/14/2017, 3:53 PM  ----------------------------------------------- I cannot submit the [facility] rx to [facility] since I am not a Arts development officer.   Instead, try reaching out to the [facility] through [facility] first, and complete their application, discuss your options. If this works, you can notify me when ready and usually there is some paperwork for [place] to complete and we re-print your prescriptions and submit them back, there is some delay with this, but if it works they may be covered.   [name], [name], [place] [address] Phone: [phone]  [email or web address]

## 2017-11-14 NOTE — Assessment & Plan Note (Signed)
Mostly Controlled cholesterol on statin and lifestyle - except elevated TG >300 Last lipid panel 10/2017  Plan: 1. Continue current meds - atorvastatin 20mg  daily 2. Encourage improved lifestyle - low carb/cholesterol, reduce portion size, continue improving regular exercise - May consider adding OTC FIsh Oil omega 3 1000mg  1-2 times daily in future

## 2017-11-14 NOTE — Assessment & Plan Note (Signed)
Stable chronic depression, complicated by Schizophrenia Followed by [facility] - now has new provider, re-established On Invega injectable, Fluoxetine, Trazodone

## 2017-11-14 NOTE — Assessment & Plan Note (Signed)
Stable chronic problem with COPD Followed by [facility] Dr Juanell Fairly TBD scheduled PFTs Continue maintenance inhaler Dulera - dose adjusted to 2 puffs BID On Albuterol PRN rarely using  Reviewed his request for [place] assistance - I do not believe I am able to send rx to them as I am not a [facility] provider. I have advised staff to contact patient back with info on [facility] MAP - will mail letter he may check into local financial assistance for rx

## 2017-11-14 NOTE — Progress Notes (Signed)
Subjective:    Patient ID: Randy [name], male    DOB: [birth year], 47 y.o.   MRN: [id number]  Randy [name] is a 47 y.o. male presenting on 11/14/2017 for Annual Exam   HPI  Here for Annual Physical and Lab Review.  Schizophrenia / Major Depression recurrent Followed by [facility] for specialized Schizophrenia patients, chart reviewed from last visit, documentation shows patient has history of fetal alcohol syndrome, recurrent major depression and schizophrenia. He has history of positive psychotic symptoms despite high dose risperidone, was changed to Zyprexa, also was on Prozac for while with Trazodone for sleep. Has been approved for Invega long acting injection therapy, last visit his trazodone was increased and melatonin started. - Today he is reporting overall doing well - He was seen by new provider at [facility] recentlky  COPD / Former smoker now tobacco abuse with smokeless  Followed by [facility] Pulmonology Dr Juanell Fairly now since 10/2017 - see below for OSA Adjusted Dulera use - recently was using too much now advised to use Dulera 2 puffs BID, he needs financial assistance with this med. He already has the [facility] - he just needs [place] to send it to [facility] 2 puff BID - however unable to do so as I am not a [facility] provider  Elevated A1c / HYPERLIPIDEMIA / Hypertriglyceridemia / Obesity BMI >38 Review lab now with A1c 6.1, unchanged since last 6.1 in 03/2017. Attributed to anti-psychotic/schizophrenia medication causing hyperglycemia. Other lab with Hypertriglycerides >300, otherwise lipids normal Meds: none Lifestyle: - Diet (- He is not following any particular diet, will try more low carb options)  - Exercise (Walking regularly for activity and exercise) Denies hypoglycemia  OSA / Excessive Daytime Sleepiness Followed by [facility] Pulmonology Dr Juanell Fairly, seen on 11/04/17, he has been arranged for Home Sleep Study to diagnose and possibly in future initiate  CPAP  Episodes Dizziness / BPPV Reports recent problem, with vertigo symptoms triggered by movement and has brief episode of room spinning dizziness. In past improved with time and also tried medicine.  Additional updates Follow-up Scalp abscess - Resolved on doxycycline  PMH - Dysphagia, followed by [facility] - History of Non-Hodgkin's Lymphoma (NHL) s/p neck/chest irradiation  Health Maintenance: Will return for Flu vaccine when in stock.  Depression screen [facility] 2/9 11/14/2017 10/30/2017  Decreased Interest 2 0  Down, Depressed, Hopeless 2 0  PHQ - 2 Score 4 0  Altered sleeping 2 -  Tired, decreased energy 1 -  Change in appetite 2 -  Feeling bad or failure about yourself  2 -  Trouble concentrating 1 -  Moving slowly or fidgety/restless 0 -  Suicidal thoughts 0 -  PHQ-9 Score 12 -  Difficult doing work/chores Not difficult at all -   GAD 7 : Generalized Anxiety Score 11/14/2017  Nervous, Anxious, on Edge 1  Control/stop worrying 2  Worry too much - different things 1  Trouble relaxing 2  Restless 2  Easily annoyed or irritable 2  Afraid - awful might happen 1  Total GAD 7 Score 11  Anxiety Difficulty Not difficult at all     Past Medical History:  Diagnosis Date  . Allergy   . COPD (chronic obstructive pulmonary disease) ([place])   . GERD (gastroesophageal reflux disease)    History reviewed. No pertinent surgical history. Social History   Socioeconomic History  . Marital status: Married    Spouse name: Not on file  . Number  of children: Not on file  . Years of education: Not on file  . Highest education level: Not on file  Occupational History  . Not on file  Social Needs  . Financial resource strain: Not on file  . Food insecurity:    Worry: Not on file    Inability: Not on file  . Transportation needs:    Medical: Not on file    Non-medical: Not on file  Tobacco Use  . Smoking status: Former Research scientist (life sciences)  . Smokeless tobacco: Current User    Types: Chew   Substance and Sexual Activity  . Alcohol use: No  . Drug use: Never  . Sexual activity: Not on file  Lifestyle  . Physical activity:    Days per week: Not on file    Minutes per session: Not on file  . Stress: Not on file  Relationships  . Social connections:    Talks on phone: Not on file    Gets together: Not on file    Attends religious service: Not on file    Active member of club or organization: Not on file    Attends meetings of clubs or organizations: Not on file    Relationship status: Not on file  . Intimate partner violence:    Fear of current or ex partner: Not on file    Emotionally abused: Not on file    Physically abused: Not on file    Forced sexual activity: Not on file  Other Topics Concern  . Not on file  Social History Narrative  . Not on file   Family History  Problem Relation Age of Onset  . Alcohol abuse Mother   . Cancer Father        colon    Current Outpatient Medications on File Prior to Visit  Medication Sig  . albuterol (PROAIR HFA) 108 (90 Base) MCG/ACT inhaler Inhale 1-2 puffs into the lungs every 6 (six) hours as needed for wheezing or shortness of breath.  [name] albuterol (PROVENTIL HFA;VENTOLIN HFA) 108 (90 Base) MCG/ACT inhaler Inhale 2 puffs into the lungs every 4 (four) hours as needed for wheezing or shortness of breath (cough).  [name] atorvastatin (LIPITOR) 20 MG tablet Take 1 tablet (20 mg total) by mouth daily.  . carbamide peroxide (DEBROX) 6.5 % OTIC solution Administer 5 drops into both ears Two (2) times a day.  . cetirizine (ZYRTEC) 10 MG tablet Take 1 tablet (10 mg total) by mouth daily.  [name] FLUoxetine (PROZAC) 20 MG tablet Take 20 mg by mouth daily.  . fluticasone (FLONASE) 50 MCG/ACT nasal spray Place 1 spray into both nostrils 2 (two) times daily.  [name] ipratropium (ATROVENT HFA) 17 MCG/ACT inhaler Inhale into the lungs.  . Melatonin 3 MG TABS Take by mouth.  . mometasone-formoterol (DULERA) 100-5 MCG/ACT AERO Inhale 2 puffs into the  lungs 2 (two) times daily.  . montelukast (SINGULAIR) 10 MG tablet Take 1 tablet (10 mg total) by mouth at bedtime.  [name] OLANZapine (ZYPREXA) 20 MG tablet Take by mouth.  [name] omeprazole (PRILOSEC) 20 MG capsule Take 1 capsule (20 mg total) by mouth daily before breakfast.  . ondansetron (ZOFRAN-ODT) 4 MG disintegrating tablet Take 1 tablet (4 mg total) by mouth every 8 (eight) hours as needed for nausea or vomiting.  . paliperidone (INVEGA SUSTENNA) 156 MG/ML SUSY injection INJECT THE CONTENTS OF 1 SYRINGE (156 MG) INTRAMUSCULARLY EVERY 28 DAYS  . traZODone (DESYREL) 100 MG tablet Take 100 mg by mouth at  bedtime.   No current facility-administered medications on file prior to visit.     Review of Systems  Constitutional: Negative for activity change, appetite change, chills, diaphoresis, fatigue and fever.  HENT: Negative for congestion and hearing loss.   Eyes: Negative for visual disturbance.  Respiratory: Negative for apnea, cough, choking, chest tightness, shortness of breath and wheezing.   Cardiovascular: Negative for chest pain, palpitations and leg swelling.  Gastrointestinal: Negative for abdominal pain, anal bleeding, blood in stool, constipation, diarrhea, nausea and vomiting.  Endocrine: Negative for cold intolerance.  Genitourinary: Negative for difficulty urinating, dysuria, frequency and hematuria.  Musculoskeletal: Negative for arthralgias, back pain and neck pain.  Skin: Negative for rash.  Allergic/Immunologic: Negative for environmental allergies.  Neurological: Positive for dizziness (not actively). Negative for weakness, light-headedness, numbness and headaches.  Hematological: Negative for adenopathy.  Psychiatric/Behavioral: Negative for behavioral problems, dysphoric mood and sleep disturbance. The patient is not nervous/anxious.    Per HPI unless specifically indicated above     Objective:    BP 111/63   Pulse 80   Temp 98.5 F (36.9 C) (Oral)   Resp 16   Ht  5\' 9"  (1.753 m)   Wt 260 lb (117.9 kg)   BMI 38.40 kg/m   Wt Readings from Last 3 Encounters:  11/14/17 260 lb (117.9 kg)  11/04/17 259 lb (117.5 kg)  10/30/17 259 lb (117.5 kg)    Physical Exam  Constitutional: He is oriented to person, place, and time. He appears well-developed and well-nourished. No distress.  Well-appearing, comfortable, cooperative  HENT:  Head: Normocephalic and atraumatic.  Mouth/Throat: Oropharynx is clear and moist.  Frontal / maxillary sinuses non-tender. Nares patent without purulence or edema. Bilateral TMs clear without erythema, effusion or bulging, mild cerumen non obstructing. Oropharynx clear without erythema, exudates, edema or asymmetry.  Eyes: Pupils are equal, round, and reactive to light. Conjunctivae and EOM are normal. Right eye exhibits no discharge. Left eye exhibits no discharge.  Neck: Normal range of motion. Neck supple. No thyromegaly present.  Cardiovascular: Normal rate, regular rhythm, normal heart sounds and intact distal pulses.  No murmur heard. Pulmonary/Chest: Effort normal and breath sounds normal. No respiratory distress. He has no wheezes. He has no rales.  Good air movement today. No coughing.  Abdominal: Soft. Bowel sounds are normal. He exhibits no distension and no mass. There is no tenderness.  Musculoskeletal: Normal range of motion. He exhibits no edema or tenderness.  Upper / Lower Extremities: - Normal muscle tone, strength bilateral upper extremities 5/5, lower extremities 5/5  Lymphadenopathy:    He has no cervical adenopathy.  Neurological: He is alert and oriented to person, place, and time.  Distal sensation intact to light touch all extremities  Skin: Skin is warm and dry. No rash noted. He is not diaphoretic. No erythema.  Scalp lesion resolved, prior erythema and tenderness resolved. No ulceration.  Psychiatric: He has a normal mood and affect. His behavior is normal.  Well groomed, good eye contact, normal  speech and thoughts  Nursing note and vitals reviewed.  Results for orders placed or performed in visit on 11/07/17  T4, free  Result Value Ref Range   Free T4 0.9 0.8 - 1.8 ng/dL  TSH  Result Value Ref Range   TSH 2.08 0.40 - 4.50 mIU/L  Lipid panel  Result Value Ref Range   Cholesterol 175 <200 mg/dL   HDL 52 >40 mg/dL   Triglycerides 301 (H) <150 mg/dL   LDL Cholesterol (  Calc) 87 mg/dL (calc)   Total CHOL/HDL Ratio 3.4 <5.0 (calc)   Non-HDL Cholesterol (Calc) 123 <130 mg/dL (calc)  COMPLETE METABOLIC PANEL WITH GFR  Result Value Ref Range   Glucose, Bld 104 (H) 65 - 99 mg/dL   BUN 21 7 - 25 mg/dL   Creat 1.09 0.60 - 1.35 mg/dL   GFR, Est Non African American 80 > OR = 60 mL/min/1.80m2   GFR, Est African American 93 > OR = 60 mL/min/1.37m2   BUN/Creatinine Ratio NOT APPLICABLE 6 - 22 (calc)   Sodium 138 135 - 146 mmol/L   Potassium 4.1 3.5 - 5.3 mmol/L   Chloride 100 98 - 110 mmol/L   CO2 28 20 - 32 mmol/L   Calcium 9.8 8.6 - 10.3 mg/dL   Total Protein 7.3 6.1 - 8.1 g/dL   Albumin 4.4 3.6 - 5.1 g/dL   Globulin 2.9 1.9 - 3.7 g/dL (calc)   AG Ratio 1.5 1.0 - 2.5 (calc)   Total Bilirubin 0.5 0.2 - 1.2 mg/dL   Alkaline phosphatase (APISO) 65 40 - 115 U/L   AST 33 10 - 40 U/L   ALT 44 9 - 46 U/L  CBC with Differential/Platelet  Result Value Ref Range   WBC 4.8 3.8 - 10.8 Thousand/uL   RBC 4.51 4.20 - 5.80 Million/uL   Hemoglobin 13.5 13.2 - 17.1 g/dL   HCT 40.0 38.5 - 50.0 %   MCV 88.7 80.0 - 100.0 fL   MCH 29.9 27.0 - 33.0 pg   MCHC 33.8 32.0 - 36.0 g/dL   RDW 14.1 11.0 - 15.0 %   Platelets 178 140 - 400 Thousand/uL   MPV 12.1 7.5 - 12.5 fL   Neutro Abs 2,726 1,500 - 7,800 cells/uL   Lymphs Abs 1,598 850 - 3,900 cells/uL   WBC mixed population 384 200 - 950 cells/uL   Eosinophils Absolute 62 15 - 500 cells/uL   Basophils Absolute 29 0 - 200 cells/uL   Neutrophils Relative % 56.8 %   Total Lymphocyte 33.3 %   Monocytes Relative 8.0 %   Eosinophils Relative 1.3  %   Basophils Relative 0.6 %  Hemoglobin A1c  Result Value Ref Range   Hgb A1c MFr Bld 6.1 (H) <5.7 % of total Hgb   Mean Plasma Glucose 128 (calc)   eAG (mmol/L) 7.1 (calc)      Assessment & Plan:   Problem List Items Addressed This Visit    Chronic recurrent major depressive disorder (HCC)    Stable chronic depression, complicated by Schizophrenia Followed by [place] [facility] - now has new provider, re-established On Invega injectable, Fluoxetine, Trazodone      COPD (chronic obstructive pulmonary disease) ([place])    Stable chronic problem with COPD Followed by [facility] Dr Juanell Fairly TBD scheduled PFTs Continue maintenance inhaler Dulera - dose adjusted to 2 puffs BID On Albuterol PRN rarely using  Reviewed his request for [place] assistance - I do not believe I am able to send rx to them as I am not a [facility] provider. I have advised staff to contact patient back with info on [facility] - will mail letter he may check into local financial assistance for rx      Relevant Medications   ipratropium (ATROVENT HFA) 17 MCG/ACT inhaler   Elevated hemoglobin A1c    Stable with A1c 6.1 in past 7 months Secondary to metabolic effects of 2nd gen antipsychotic meds most likely  and lifestyle  combination Defer medications at this time. Future may consider Metformin vs GLP1 Encourage improve lifestyle, exercise - Handout given glycemic index, low carb diet options Follow-up q 6 month PreDM A1c      Hyperlipidemia    Mostly Controlled cholesterol on statin and lifestyle - except elevated TG >300 Last lipid panel 10/2017  Plan: 1. Continue current meds - atorvastatin 20mg  daily 2. Encourage improved lifestyle - low carb/cholesterol, reduce portion size, continue improving regular exercise - May consider adding OTC FIsh Oil omega 3 1000mg  1-2 times daily in future      Obesity (BMI 35.0-39.9 without comorbidity)    Weight stable Encourage improve lifestyle, diet,  exercise Goal wt loss      Schizophrenia (HCC)    Chronic Schizophrenia Followed by [place] [facility] - established with new provider now On Invega injectable, Fluoxetine, Trazodone, Melatonin - Tapering off Zyprexa - Future consider Depakote       Other Visit Diagnoses    Annual physical exam    -  Primary    Updated Health Maintenance information - Return for Flu Shot when in stock Reviewed recent lab results with patient Encouraged improvement to lifestyle with diet and exercise - Goal of weight loss    No orders of the defined types were placed in this encounter.   Follow up plan: Return in about 6 months (around 05/17/2018) for PreDM A1c, COPD, other specialist updates.  [name], [facility] 11/14/2017, 4:09 PM

## 2017-11-14 NOTE — Assessment & Plan Note (Signed)
Chronic Schizophrenia Followed by [place] [facility] - established with new provider now On Invega injectable, Fluoxetine, Trazodone, Melatonin - Tapering off Zyprexa - Future consider Depakote

## 2017-11-14 NOTE — Patient Instructions (Addendum)
Thank you for coming to the office today.  Please schedule and return for a NURSE ONLY VISIT for VACCINE - Approximately around October 2019 - Need Flu Vaccine  ------- Overall labs look good  1. Chemistry - Normal results, including electrolytes, kidney and liver function. - Slightly elevated fasting blood sugar   2. Hemoglobin A1c (Pre-Diabetes) - 6.1, still in range of Pre-Diabetes (>5.7 to 6.4) - try to improve low carb low sugar diet  3. TSH Thyroid Function Tests - Normal.  4. CBC Blood Counts - Normal, no anemia, other abnormality  5. Cholesterol - Normal HDL (good cholesterol), Normal LDL (bad cholesterol) - Elevated 301 Triglycerides.  Mostly controlled on Atorvastatin 20mg . However, still high Triglycerides likely related to blood sugar. If you improve diet then will help reduce triglycerides, otherwise we recommend Omega 3 Fish Oil 1000mg  daily with food and can even take one pill twice daily if needed  Please schedule a Follow-up Appointment to: Return in about 6 months (around 05/17/2018) for PreDM A1c, COPD, other specialist updates.  If you have any other questions or concerns, please feel free to call the office or send a message through [place]. You may also schedule an earlier appointment if necessary.  Additionally, you may be receiving a survey about your experience at our office within a few days to 1 week by e-mail or mail. We value your feedback.  [name], DO [facility], [facility]  You have symptoms of Vertigo (Benign Paroxysmal Positional Vertigo) - This is commonly caused by inner ear fluid imbalance, sometimes can be worsened by allergies and sinus symptoms, otherwise it can occur randomly sometimes and we may never discover the exact cause. - To treat this, try the Epley Manuever (see diagrams/instructions below) at home up to 3 times a day for 1-2 weeks or until symptoms resolve - You may take OTC Meclizine as needed up to 3 times  a day for dizziness, this will not cure symptoms but may help. Caution may make you drowsy.  If you develop significant worsening episode with vertigo that does not improve and you get severe headache, loss of vision, arm or leg weakness, slurred speech, or other concerning symptoms please seek immediate medical attention at Emergency Department.  Please schedule a follow-up appointment with Dr Parks Ranger within 4 weeks if Vertigo not improving, and will consider Referral to Vestibular Rehab  See the next page for images describing the Epley Manuever.     ----------------------------------------------------------------------------------------------------------------------

## 2017-11-15 MED ORDER — OLANZAPINE 5 MG TABLET
ORAL_TABLET | Freq: Every evening | ORAL | 0 refills | 0 days | Status: CP
Start: 2017-11-15 — End: 2018-03-06
  Filled 2017-11-21: qty 30, 30d supply, fill #0

## 2017-11-15 NOTE — Telephone Encounter (Signed)
Left message for patient to call back  

## 2017-11-15 NOTE — Unmapped (Signed)
Received a call from patient's wife Printice Hellmer requesting to speak with provider regarding medications.     Ph. [phone]

## 2017-11-15 NOTE — Unmapped (Signed)
Pt is requesting a refill on Olanzapine 20 mg tablet. I have pended the medication for your approval and have verified the patient's pharmacy choice.

## 2017-11-15 NOTE — Unmapped (Signed)
Returned call to patient as he had been requesting refill of PO Zyprexa medication, with wife also calling to request. Spoke with patient over the phone. He reports that late last week he began to experience auditory hallucinations and angry outbursts again. States that he had tapered down Zyprexa medication as previously discussed at last appointment in setting of LAI invega sustenna initiation, though with noted symptoms, had continued to take 5mg  PO Zyprexa. Feels this was helpful for his AH and irritability/agitation. Denies any additional symptoms including CAH or SI. Tolerating medication well. Discussed risks of continuing with PO and LAI antipsychotic medication and need to further address symptoms on regimen that includes one or the other for maintenance medication with possibility of PRN medications. Patient agreeable to plan to restart Zyprexa 5mg  nightly to address AH, with instructions to try utilizing as PRN instead based on symptomatology. Will call patient again next week to check-in. Next follow-up scheduled for 9/4, with next injection due at that time as well.

## 2017-11-20 ENCOUNTER — Encounter: Payer: Self-pay | Admitting: Internal Medicine

## 2017-11-20 DIAGNOSIS — G4719 Other hypersomnia: Secondary | ICD-10-CM

## 2017-11-21 MED FILL — OLANZAPINE 5 MG TABLET: 30 days supply | Qty: 30 | Fill #0 | Status: AC

## 2017-11-21 NOTE — Telephone Encounter (Signed)
Called several time unable to reach patient a letter has been mailed.

## 2017-11-21 NOTE — Unmapped (Signed)
Pt wife [name] Petree called stating [facility] refuse to dispense Olanzapine 5mg , the pharmacy mentioned to [name], until [place] is off the taper medication, only then are they allowed to refill the Olanzapine 5 mg tablet, [name] stated chapman have been having out burst and very irritable, [name] also mentions she thinks reiley needs to be on a higher dosage of the Invega 156 mg injection, [name] would like for you to give her a call back at [phone].

## 2017-11-27 ENCOUNTER — Ambulatory Visit
Admit: 2017-11-27 | Discharge: 2017-11-28 | Payer: MEDICARE | Attending: Student in an Organized Health Care Education/Training Program | Primary: Student in an Organized Health Care Education/Training Program

## 2017-11-27 ENCOUNTER — Ambulatory Visit: Admit: 2017-11-27 | Discharge: 2017-11-28 | Payer: MEDICARE

## 2017-11-27 DIAGNOSIS — R69 Illness, unspecified: Secondary | ICD-10-CM | POA: Diagnosis not present

## 2017-11-27 DIAGNOSIS — F209 Schizophrenia, unspecified: Principal | ICD-10-CM

## 2017-11-27 MED ORDER — PALIPERIDONE PALMITATE 234 MG/1.5 ML INTRAMUSCULAR SYRINGE
INTRAMUSCULAR | 3 refills | 0 days | Status: CP
Start: 2017-11-27 — End: 2018-02-19
  Filled 2018-01-01: qty 1.5, 28d supply, fill #0

## 2017-11-27 NOTE — Unmapped (Signed)
I saw and evaluated the patient, participating in the key portions of the service.  I reviewed the resident???s note.  I agree with the resident???s findings and plan. Catalina Pizza, MD
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
## 2017-11-27 NOTE — Unmapped (Addendum)
Medications: Your dose of Invega sustenna was increased at today's visit. Continue to take your oral Zyprexa 5mg  nightly for one week. After a week, decrease the dose by one half to 2.5mg  nightly and take that for a few days before stopping the oral medication completely. After discontinuation of the medication, if you notice reemergence of irritability/agitation or changes in mood, please call to discuss.    Follow-up instructions:  -- Please continue taking your medications as prescribed for your mental health.   -- Do not make changes to your medications, including taking more or less than prescribed, unless under the supervision of your physician. Be aware that some medications may make you feel worse if abruptly stopped  -- Please refrain from using illicit substances, as these can affect your mood and could cause anxiety or other concerning symptoms.   -- Seek further medical care for any increase in symptoms or new symptoms such as thoughts of wanting to hurt yourself or hurt others.     Contact info:  Life-threatening emergencies: call 911 or go to the nearest ER for medical or psychiatric attention.     Issues that need urgent attention but are not life threatening: call the clinic at [phone] or the outpatient nurses line [phone] for assistance.     Non-urgent routine concerns, questions, and refill requests: please call [phone] for assistance.     Regarding appointments:  - If you need to cancel your appointment, we ask that you call [phone] at least 24 hours before your scheduled appointment.  - If for any reason you arrive 15 minutes later than your scheduled appointment time, you may not be seen and your visit may be rescheduled.  - Please remember that we will not automatically reschedule missed appointments.  - If you miss two (2) appointments without letting us know in advance, you will likely be referred to a provider in your community.  - We will do our best to be on time. Sometimes an emergency will arise that might cause your clinician to be late. We will try to inform you of this when you check in for your appointment. If you wait more than 15 minutes past your appointment time without such notice, please speak with the front desk staff.    In the event of bad weather, the clinic staff will attempt to contact you, should your appointment need to be rescheduled. Additionally, you can call the Patient Weather Line [phone] for system-wide clinic status    For more information and reminders regarding clinic policies (these were provided when you were admitted to the clinic), please ask the front desk.

## 2017-11-27 NOTE — Unmapped (Signed)
Administered long-acting injectable today, 11/27/2017. See MAR for additional documentation. Alverda Skeans, CMA

## 2017-11-29 ENCOUNTER — Ambulatory Visit (INDEPENDENT_AMBULATORY_CARE_PROVIDER_SITE_OTHER): Payer: Medicare HMO | Admitting: Family Medicine

## 2017-11-29 ENCOUNTER — Encounter: Payer: Self-pay | Admitting: Family Medicine

## 2017-11-29 ENCOUNTER — Telehealth: Payer: Self-pay | Admitting: *Deleted

## 2017-11-29 VITALS — BP 132/75 | HR 90 | Temp 98.3°F | Resp 16 | Ht 69.0 in | Wt 265.0 lb

## 2017-11-29 DIAGNOSIS — K047 Periapical abscess without sinus: Secondary | ICD-10-CM | POA: Diagnosis not present

## 2017-11-29 DIAGNOSIS — G4733 Obstructive sleep apnea (adult) (pediatric): Secondary | ICD-10-CM

## 2017-11-29 DIAGNOSIS — K0889 Other specified disorders of teeth and supporting structures: Secondary | ICD-10-CM | POA: Diagnosis not present

## 2017-11-29 DIAGNOSIS — K029 Dental caries, unspecified: Secondary | ICD-10-CM

## 2017-11-29 MED ORDER — AMOXICILLIN-POT CLAVULANATE 875-125 MG PO TABS
1.0000 | ORAL_TABLET | Freq: Two times a day (BID) | ORAL | 0 refills | Status: DC
Start: 2017-11-29 — End: 2018-04-01

## 2017-11-29 MED ORDER — TRAMADOL HCL 50 MG PO TABS: 50.0000 mg | ORAL_TABLET | Freq: Three times a day (TID) | ORAL | 0 refills | Status: AC | PRN

## 2017-11-29 NOTE — Telephone Encounter (Signed)
LMTCB   Moderate OSA  AHI 15 Auto-cpap with pressure range of 5-15 cmH20

## 2017-11-29 NOTE — Progress Notes (Signed)
Subjective:    Patient ID: Randy [name], male    DOB: [birth year], 47 y.o.   MRN: [id number]  Randy [name] is a 47 y.o. male presenting on 11/29/2017 for Dental Pain (has 2 broken teeth from yesterday )  Patient presents for a same day appointment.  HPI  Dental Pain and Infection / Broken Teeth - Reports that he was eating a piece of steak and he felt / heard something "crunch" and he pulled the piece of steak out and it came with part of two teeth one on lower R and upper R side. He had pain and some bleeding, also developed a swollen gum with "pus" he stated that he had to "pop a bubble" on gum and it drained pus. Overall he thinks the teeth were bothering him with pain and swelling since this past Monday about 4 days ago but he did not think as much of it until the broken teeth. - He does not have a Pharmacist, community. He is not covered under his insurance for dental. - He is using some OTC Orajel topical. Tylenol for pain without significant relief. - No antibiotic allergies. No recent course. History of pain medicine very rarely before, no concerns with meds or tolerating them. Admits jaw pain dental pain - Denies any fevers or chills, nausea vomiting  Depression screen [facility] 2/9 11/29/2017 11/14/2017 10/30/2017  Decreased Interest 0 2 0  Down, Depressed, Hopeless 0 2 0  PHQ - 2 Score 0 4 0  Altered sleeping - 2 -  Tired, decreased energy - 1 -  Change in appetite - 2 -  Feeling bad or failure about yourself  - 2 -  Trouble concentrating - 1 -  Moving slowly or fidgety/restless - 0 -  Suicidal thoughts - 0 -  PHQ-9 Score - 12 -  Difficult doing work/chores - Not difficult at all -    Social History   Tobacco Use  . Smoking status: Former Research scientist (life sciences)  . Smokeless tobacco: Current User    Types: Chew  Substance Use Topics  . Alcohol use: No  . Drug use: Never    Review of Systems Per HPI unless specifically indicated above     Objective:    BP 132/75   Pulse 90   Temp 98.3 F (36.8  C) (Oral)   Resp 16   Ht 5\' 9"  (1.753 m)   Wt 265 lb (120.2 kg)   BMI 39.13 kg/m   Wt Readings from Last 3 Encounters:  11/29/17 265 lb (120.2 kg)  11/14/17 260 lb (117.9 kg)  11/04/17 259 lb (117.5 kg)    Physical Exam  Constitutional: He is oriented to person, place, and time. He appears well-developed and well-nourished. No distress.  Uncomfortable due to pain, cooperative  HENT:  Head: Normocephalic and atraumatic.  Mouth/Throat: Oropharynx is clear and moist.  Very poor dentition with obvious extensive dental decay, several teeth with partial mission teeth and decay. Right lower gum jaw with some edema without obvious erythema or active abscess or drainage visible today.  Eyes: Conjunctivae are normal. Right eye exhibits no discharge. Left eye exhibits no discharge.  Neck: Neck supple.  Right upper anterior cervical region mild tender over submandibular region without palpable lymph node  Cardiovascular: Normal rate.  Pulmonary/Chest: Effort normal.  Musculoskeletal: He exhibits no edema.  Lymphadenopathy:    He has no cervical adenopathy.  Neurological: He is alert and oriented to person, place, and time.  Skin: Skin is warm and  dry. No rash noted. He is not diaphoretic. No erythema.  Psychiatric: He has a normal mood and affect. His behavior is normal.  Well groomed, good eye contact, normal speech and thoughts  Nursing note and vitals reviewed.  Results for orders placed or performed in visit on 11/07/17  T4, free  Result Value Ref Range   Free T4 0.9 0.8 - 1.8 ng/dL  TSH  Result Value Ref Range   TSH 2.08 0.40 - 4.50 mIU/L  Lipid panel  Result Value Ref Range   Cholesterol 175 <200 mg/dL   HDL 52 >40 mg/dL   Triglycerides 301 (H) <150 mg/dL   LDL Cholesterol (Calc) 87 mg/dL (calc)   Total CHOL/HDL Ratio 3.4 <5.0 (calc)   Non-HDL Cholesterol (Calc) 123 <130 mg/dL (calc)  COMPLETE METABOLIC PANEL WITH GFR  Result Value Ref Range   Glucose, Bld 104 (H) 65 - 99  mg/dL   BUN 21 7 - 25 mg/dL   Creat 1.09 0.60 - 1.35 mg/dL   GFR, Est Non African American 80 > OR = 60 mL/min/1.83m2   GFR, Est African American 93 > OR = 60 mL/min/1.23m2   BUN/Creatinine Ratio NOT APPLICABLE 6 - 22 (calc)   Sodium 138 135 - 146 mmol/L   Potassium 4.1 3.5 - 5.3 mmol/L   Chloride 100 98 - 110 mmol/L   CO2 28 20 - 32 mmol/L   Calcium 9.8 8.6 - 10.3 mg/dL   Total Protein 7.3 6.1 - 8.1 g/dL   Albumin 4.4 3.6 - 5.1 g/dL   Globulin 2.9 1.9 - 3.7 g/dL (calc)   AG Ratio 1.5 1.0 - 2.5 (calc)   Total Bilirubin 0.5 0.2 - 1.2 mg/dL   Alkaline phosphatase (APISO) 65 40 - 115 U/L   AST 33 10 - 40 U/L   ALT 44 9 - 46 U/L  CBC with Differential/Platelet  Result Value Ref Range   WBC 4.8 3.8 - 10.8 Thousand/uL   RBC 4.51 4.20 - 5.80 Million/uL   Hemoglobin 13.5 13.2 - 17.1 g/dL   HCT 40.0 38.5 - 50.0 %   MCV 88.7 80.0 - 100.0 fL   MCH 29.9 27.0 - 33.0 pg   MCHC 33.8 32.0 - 36.0 g/dL   RDW 14.1 11.0 - 15.0 %   Platelets 178 140 - 400 Thousand/uL   MPV 12.1 7.5 - 12.5 fL   Neutro Abs 2,726 1,500 - 7,800 cells/uL   Lymphs Abs 1,598 850 - 3,900 cells/uL   WBC mixed population 384 200 - 950 cells/uL   Eosinophils Absolute 62 15 - 500 cells/uL   Basophils Absolute 29 0 - 200 cells/uL   Neutrophils Relative % 56.8 %   Total Lymphocyte 33.3 %   Monocytes Relative 8.0 %   Eosinophils Relative 1.3 %   Basophils Relative 0.6 %  Hemoglobin A1c  Result Value Ref Range   Hgb A1c MFr Bld 6.1 (H) <5.7 % of total Hgb   Mean Plasma Glucose 128 (calc)   eAG (mmol/L) 7.1 (calc)      Assessment & Plan:   Problem List Items Addressed This Visit    None    Visit Diagnoses    Dental infection    -  Primary   Relevant Medications   amoxicillin-clavulanate (AUGMENTIN) 875-125 MG tablet   traMADol (ULTRAM) 50 MG tablet   Dental decay       Relevant Medications   amoxicillin-clavulanate (AUGMENTIN) 875-125 MG tablet   Pain, dental  Relevant Medications   traMADol (ULTRAM) 50  MG tablet      Clinically with significant dental decay and fractured/broken teeth at least 2 locations with history of abscess infection, without evidence of systemic symptoms. Afebrile. Notable dental pain. History of very poor oral hygiene with chronic smokeless tobacco use and no regular dental care or coverage.  Plan Discussed my concerns with his acute on chronic dental problems with decay and now concern for infection. Expressed that he needs to see a dentist for more complete care of this problem. Unfortunately he has no dental insurance coverage. - Start empiric Augmentin antibiotic BID for 10 days - Start Tramadol 50-100mg  q 8 hr PRN for pain #30 for 5 day supply - discussed controlled med, only acute pain temporary for now until he can have this treated. If not effective he may notify us within 3-5 days and we can consider switch to Hydrocodone-Acetaminophen again brief course 5 days. - Handouts given with extensive options for free or minimum cost / sliding scale dental clinics in [facility] and out in [place], one of main options for full scope of care would be [facility], other options locally may cost more but are given to patient.  Return criteria reviewed if significant worsening pain, unable to eat / drink, fever chills or other symptoms, he may go to hospital ED for more acute evaluation, may need a dental nerve block other antibiotics or pain medicine and more prompt dental referral.  Meds ordered this encounter  Medications  . amoxicillin-clavulanate (AUGMENTIN) 875-125 MG tablet    Sig: Take 1 tablet by mouth 2 (two) times daily. For 10 days    Dispense:  20 tablet    Refill:  0  . traMADol (ULTRAM) 50 MG tablet    Sig: Take 1-2 tablets (50-100 mg total) by mouth every 8 (eight) hours as needed for up to 5 days.    Dispense:  30 tablet    Refill:  0    Follow up plan: Return in about 1 week (around 12/06/2017), or if symptoms worsen or fail to improve,  for dental infection.  [name], DO [facility] 11/29/2017, 11:19 AM

## 2017-11-29 NOTE — Patient Instructions (Addendum)
Thank you for coming to the office today.  For the dental infection / broken teeth - go ahead and start Antibitioc Augmentin twice a day for 10 days.  Take Tramadol as needed for pain 1 to 2 pills up to 3 times daily.  If not effective can go to hospital ED for nerve block dental injection or other pain medicine.  Also try ORAJEL oral debridement antiseptic mouthwash with numbing as well OTC.  If worsening pain fever swelling - cannot eat or drink or more severe symptoms, then notify office or go to hospital    [place] [email or web address] [phone])  [facility] [phone])  [place] [phone])   [facility] [phone]) This clinic caters to the indigent population and is on a lottery system. Location: [employer] [facility], [place], [place], [place] Clinic Hours: Wednesdays from 6pm - 9pm, patients seen by a lottery system. For dates, call or go to [email or web address] Services: Cleanings, fillings and simple extractions. Payment Options: DENTAL WORK IS FREE OF CHARGE. Bring proof of income or support. Best way to get seen: Arrive at 5:15 pm - this is a lottery, NOT first come/first serve, so arriving earlier will not increase your chances of being seen.   [facility] [phone] Select option 1 for emergencies  Location: [facility], [place], [address], [place] Clinic Hours: No walk-ins accepted - call the day before to schedule an appointment. Check in times are 9:30 am and 1:30 pm. Services: Simple extractions, temporary fillings, pulpectomy/pulp debridement, uncomplicated abscess drainage. Payment Options: PAYMENT IS DUE AT THE TIME OF  SERVICE. Fee is usually $100-200, additional surgical procedures (e.g. abscess drainage) may be extra. Cash, checks, Visa/MasterCard accepted. Can file Medicaid if patient is covered for dental - patient should call case worker to check. No discount for [facility] patients. Best way to get seen: MUST call the day before and get onto the schedule. Can usually be seen the next 1-2 days. No walk-ins accepted.   [place] [phone]  Location: [place], [place] Clinic Hours: M, W, Th, F 8am or 1:30pm, Tues 9a or 1:30 - first come/first served. Services: Simple extractions, temporary fillings, uncomplicated abscess drainage. You do not need to be an [facility] resident. Payment Options: PAYMENT IS DUE AT THE TIME OF SERVICE. Dental insurance, otherwise sliding scale - bring proof of income or support. Depending on income and treatment needed, cost is usually $50-200. Best way to get seen: Arrive early as it is first come/first served.   [facility] [phone]  Location: [place] [facility] Hours: Mon-Thu 8a-5p Services: Most basic dental services including extractions and fillings. Payment Options: PAYMENT IS DUE AT THE TIME OF SERVICE. Sliding scale, up to 50% off - bring proof if income or support. Medicaid with dental option accepted. Best way to get seen: Call to schedule an appointment, can usually be seen within 2 weeks OR they will try to see walk-ins - show up at [place] or 2p (you may have to wait).   [facility] [place] RESIDENTS ONLY  Location: [facility], [place] [address] [place], [place] [address] Clinic Hours: By appointment only. Monday - Thursday 8am-5pm, Friday 8am-12pm Services: Cleanings, fillings,  extractions. Payment Options: PAYMENT IS DUE AT THE TIME OF SERVICE. Cash,  Visa or MasterCard. Sliding scale - $30 minimum per service. Best way to get seen: Come in to office, complete packet and make an appointment - need proof of income or support monies for each household member and proof of [facility] residence. Usually takes about a month to get in.   [facility] [phone]  Location: [address] [place] Clinic Hours: Walk-in Urgent Care Dental Services are offered Monday-Friday mornings only. The numbers of emergencies accepted daily is limited to the number of providers available. Maximum 15 - Mondays, Wednesdays & Thursdays Maximum 10 - Tuesdays & Fridays Services: You do not need to be a [facility] resident to be seen for a dental emergency. Emergencies are defined as pain, swelling, abnormal bleeding, or dental trauma. Walkins will receive x-rays if needed. NOTE: Dental cleaning is not an emergency. Payment Options: PAYMENT IS DUE AT THE TIME OF SERVICE. Minimum co-pay is $40.00 for uninsured patients. Minimum co-pay is $3.00 for Medicaid with dental coverage. Dental Insurance is accepted and must be presented at time of visit. Medicare does not cover dental. Forms of payment: Cash, credit card, checks. Best way to get seen: If not previously registered with the clinic, walk-in dental registration begins at 7:15 am and is on a first come/first serve basis. If previously registered with the clinic, call to make an appointment.   [facility] [place] ONLY  Location: [address], [place], [place] Clinic Hours: Mon-Thu 10a-2p Services: Extractions only! Payment Options: FREE (donations accepted) - bring proof of income or support Best way to get seen: Call and schedule an appointment OR come at 8am on the 1st Monday of every month (except for holidays) when it is first come/first served.   [facility] [phone]  Location: [place], [place] Clinic Hours: Friday mornings Services, Payment Options, Best way to get seen: Call for info   Please schedule a Follow-up Appointment to: Return in about 1 week (around 12/06/2017), or if symptoms worsen or fail to improve, for dental infection.  If you have any other questions or concerns, please feel free to call the office or send a message through [place]. You may also schedule an earlier appointment if necessary.  Additionally, you may be receiving a survey about your experience at our office within a few days to 1 week by e-mail or mail. We value your feedback.  [name], DO [place]

## 2017-11-30 ENCOUNTER — Other Ambulatory Visit: Payer: Self-pay

## 2017-11-30 ENCOUNTER — Encounter: Payer: Self-pay | Admitting: *Deleted

## 2017-11-30 ENCOUNTER — Emergency Department
Admission: EM | Admit: 2017-11-30 | Discharge: 2017-11-30 | Disposition: A | Discharge status: Home / Self Care | Payer: Medicare HMO | Attending: Emergency Medicine | Admitting: Emergency Medicine

## 2017-11-30 DIAGNOSIS — K047 Periapical abscess without sinus: Secondary | ICD-10-CM | POA: Diagnosis not present

## 2017-11-30 DIAGNOSIS — K0381 Cracked tooth: Secondary | ICD-10-CM | POA: Diagnosis not present

## 2017-11-30 DIAGNOSIS — Y929 Unspecified place or not applicable: Secondary | ICD-10-CM | POA: Diagnosis not present

## 2017-11-30 DIAGNOSIS — X58XXXA Exposure to other specified factors, initial encounter: Secondary | ICD-10-CM | POA: Diagnosis not present

## 2017-11-30 DIAGNOSIS — K0889 Other specified disorders of teeth and supporting structures: Secondary | ICD-10-CM | POA: Diagnosis present

## 2017-11-30 DIAGNOSIS — J449 Chronic obstructive pulmonary disease, unspecified: Secondary | ICD-10-CM | POA: Insufficient documentation

## 2017-11-30 DIAGNOSIS — S025XXA Fracture of tooth (traumatic), initial encounter for closed fracture: Secondary | ICD-10-CM | POA: Diagnosis not present

## 2017-11-30 DIAGNOSIS — F17220 Nicotine dependence, chewing tobacco, uncomplicated: Secondary | ICD-10-CM | POA: Insufficient documentation

## 2017-11-30 DIAGNOSIS — Z79899 Other long term (current) drug therapy: Secondary | ICD-10-CM | POA: Diagnosis not present

## 2017-11-30 DIAGNOSIS — R69 Illness, unspecified: Secondary | ICD-10-CM | POA: Diagnosis not present

## 2017-11-30 DIAGNOSIS — Y999 Unspecified external cause status: Secondary | ICD-10-CM | POA: Diagnosis not present

## 2017-11-30 DIAGNOSIS — Y939 Activity, unspecified: Secondary | ICD-10-CM | POA: Diagnosis not present

## 2017-11-30 MED ORDER — LIDOCAINE-EPINEPHRINE 2 %-1:100000 IJ SOLN
1.7000 mL | Freq: Once | INTRAMUSCULAR | Status: DC
  Filled 2017-11-30: qty 1.7

## 2017-11-30 MED ORDER — MAGIC MOUTHWASH W/LIDOCAINE: 5.0000 mL | Freq: Four times a day (QID) | ORAL | 0 refills | Status: DC

## 2017-11-30 NOTE — Discharge Instructions (Signed)
OPTIONS FOR DENTAL FOLLOW UP CARE ° °[place] Department of Health and Human Services - Local Safety Net Dental Clinics °http://www.ncdhhs.gov/dph/oralhealth/services/safetynetclinics.htm °  °[facility] ([phone]) ° °[facility] ([phone]) ° °[facility] ([phone]) ° °[facility] ([phone]) ° °[facility] ([phone]) °This clinic caters to the indigent population and is on a lottery system. °Location: °[facility], [place] [facility], [address], [place] °Clinic Hours: °Wednesdays from 6pm - 9pm, patients seen by a lottery system. °For dates, call or go to [email or web address].[facility].edu/shac/patients/Dental-[facility] °Services: °Cleanings, fillings and simple extractions. °Payment Options: °DENTAL WORK IS FREE OF CHARGE. Bring proof of income or support. °Best way to get seen: °Arrive at 5:15 pm - this is a lottery, NOT first come/first serve, so arriving earlier will not increase your chances of being seen. °  °  °[facility] °[phone] °Select option 1 for emergencies °  °Location: °[facility] [place], [address], [place] °Clinic Hours: °No walk-ins accepted - call the day before to schedule an appointment. °Check in times are 9:30 am and 1:30 pm. °Services: °Simple extractions, temporary fillings, pulpectomy/pulp debridement, uncomplicated abscess drainage. °Payment Options: °PAYMENT IS DUE AT THE TIME OF SERVICE.  Fee is usually $100-200, additional surgical procedures (e.g. abscess drainage) may be extra. °Cash, checks, Visa/MasterCard accepted.  Can file Medicaid if patient is covered for dental - patient should call case worker to check. °No discount for [facility] patients. °Best way to get seen: °MUST call the day before and get onto the schedule. Can usually be seen the next 1-2 days. No walk-ins accepted. °  °  °[facility] °[phone] °   °Location: °[facility], [address], [place] °Clinic Hours: °M, W, Th, F 8am or 1:30pm, Tues 9a or 1:30 - first come/first served. °Services: °Simple extractions, temporary fillings, uncomplicated abscess drainage.  You do not need to be an [place] resident. °Payment Options: °PAYMENT IS DUE AT THE TIME OF SERVICE. °Dental insurance, otherwise sliding scale - bring proof of income or support. °Depending on income and treatment needed, cost is usually $50-200. °Best way to get seen: °Arrive early as it is first come/first served. °  °  °[facility] °[phone] °  °Location: °[address] °Clinic Hours: °Mon-Thu 8a-5p °Services: °Most basic dental services including extractions and fillings. °Payment Options: °PAYMENT IS DUE AT THE TIME OF SERVICE. °Sliding scale, up to 50% off - bring proof if income or support. °Medicaid with dental option accepted. °Best way to get seen: °Call to schedule an appointment, can usually be seen within 2 weeks OR they will try to see walk-ins - show up at 8a or 2p (you may have to wait). °  °  °[facility] °[phone] °[place] RESIDENTS ONLY °  °Location: °[facility], [address], [place], [place] [address] °Clinic Hours: By appointment only. °Monday - Thursday 8am-5pm, Friday 8am-12pm °Services: Cleanings, fillings, extractions. °Payment Options: °PAYMENT IS DUE AT THE TIME OF SERVICE. °Cash, Visa or MasterCard. Sliding scale - $30 minimum per service. °Best way to get seen: °Come in to office, complete packet and make an appointment - need proof of income °or support monies for each household member and proof of [place] residence. °Usually takes about a month to get in. °  °  °[facility] °[phone] °  °Location: °[address]   [place] °Clinic Hours: Walk-in Urgent Care Dental Services are offered Monday-Friday  mornings only. °The numbers of emergencies accepted daily is limited to the number of °providers available. °Maximum 15 - Mondays, Wednesdays & Thursdays °Maximum 10 - Tuesdays & Fridays °Services: °You do not need to be a [place] resident to be seen for a dental emergency. °Emergencies are defined as pain, swelling, abnormal bleeding, or dental trauma. Walkins will receive x-rays if needed. °NOTE: Dental cleaning is not an emergency. °Payment Options: °PAYMENT IS DUE AT THE TIME OF SERVICE. °Minimum co-pay is $40.00 for uninsured patients. °Minimum co-pay is $3.00 for Medicaid with dental coverage. °Dental Insurance is accepted and must be presented at time of visit. °Medicare does not cover dental. °Forms of payment: Cash, credit card, checks. °Best way to get seen: °If not previously registered with the clinic, walk-in dental registration begins at 7:15 am and is on a first come/first serve basis. °If previously registered with the clinic, call to make an appointment. °  °  °[facility] °[phone] °[place] RESIDENTS ONLY °  °Location: °[address], [place], [place] °Clinic Hours: °Mon-Thu 10a-2p °Services: Extractions only! °Payment Options: °FREE (donations accepted) - bring proof of income or support °Best way to get seen: °Call and schedule an appointment OR come at 8am on the 1st Monday of every month (except for holidays) when it is first come/first served. °  °  °[facility] °[phone] °  °Location: °[address], [place] °Clinic Hours: °Friday mornings °Services, Payment Options, Best way to get seen: °Call for info °

## 2017-11-30 NOTE — ED Provider Notes (Signed)
[facility] Emergency Department Provider Note  ____________________________________________  Time seen: Approximately 5:28 PM  I have reviewed the triage vital signs and the nursing notes.   HISTORY  Chief Complaint Dental Pain    HPI Randy [name] is a 47 y.o. male he presents to emergency department complaining of right upper and lower teeth pain. Patient reports that he has a history of lymphoma, underwent chemotherapy and radiation which caused significant damage to his teeth. The patient reports that he was trying to eat several days ago, bit into a hard piece of meat and cracked both the upper and lower dentition. Patient initially was a symptomatically, and then started having pain and swelling. He saw his primary care yesterday and was prescribed tramadol amoxicillin. He reports that despite taking both of these medications the pain has increased. He denies any increase in edema to the right side of the face or neck. No difficulty breathing or swallowing. No fevers or chills. Not have a dentist.   Past Medical History:  Diagnosis Date  . Allergy   . COPD (chronic obstructive pulmonary disease) ([place])   . GERD (gastroesophageal reflux disease)     Patient Active Problem List   Diagnosis Date Noted  . Obesity (BMI 35.0-39.9 without comorbidity) 11/14/2017  . Chronic recurrent major depressive disorder ([place]) 10/14/2017  . Schizophrenia ([place]) 10/14/2017  . GERD (gastroesophageal reflux disease) 10/14/2017  . COPD (chronic obstructive pulmonary disease) ([place]) 10/14/2017  . Elevated hemoglobin A1c 10/14/2017  . Hyperlipidemia 10/14/2017    History reviewed. No pertinent surgical history.  Prior to Admission medications   Medication Sig Start Date End Date Taking? Authorizing Provider  albuterol (PROAIR HFA) 108 (90 Base) MCG/ACT inhaler Inhale 1-2 puffs into the lungs every 6 (six) hours as needed for wheezing or shortness of breath. 11/04/17    Laverle Hobby, MD  albuterol (PROVENTIL HFA;VENTOLIN HFA) 108 (90 Base) MCG/ACT inhaler Inhale 2 puffs into the lungs every 4 (four) hours as needed for wheezing or shortness of breath (cough). 10/14/17   Karamalegos, Devonne Doughty, DO  amoxicillin-clavulanate (AUGMENTIN) 875-125 MG tablet Take 1 tablet by mouth 2 (two) times daily. For 10 days 11/29/17   Olin Hauser, DO  atorvastatin (LIPITOR) 20 MG tablet Take 1 tablet (20 mg total) by mouth daily. 10/14/17   Karamalegos, Devonne Doughty, DO  carbamide peroxide (DEBROX) 6.5 % OTIC solution Administer 5 drops into both ears Two (2) times a day. 04/01/17   [provider]  cetirizine (ZYRTEC) 10 MG tablet Take 1 tablet (10 mg total) by mouth daily. 11/04/17   Karamalegos, Devonne Doughty, DO  FLUoxetine (PROZAC) 20 MG tablet Take 20 mg by mouth daily.    [provider]  fluticasone (FLONASE) 50 MCG/ACT nasal spray Place 1 spray into both nostrils 2 (two) times daily. 10/14/17   Karamalegos, Devonne Doughty, DO  ipratropium (ATROVENT HFA) 17 MCG/ACT inhaler Inhale into the lungs. 10/15/17 10/15/18  [provider]  magic mouthwash w/lidocaine SOLN Take 5 mLs by mouth 4 (four) times daily. 11/30/17   Yaw Escoto, Charline Bills, PA-C  Melatonin 3 MG TABS Take by mouth. 07/30/17 07/30/18  [provider]  mometasone-formoterol (DULERA) 100-5 MCG/ACT AERO Inhale 2 puffs into the lungs 2 (two) times daily. 10/14/17   Karamalegos, Devonne Doughty, DO  montelukast (SINGULAIR) 10 MG tablet Take 1 tablet (10 mg total) by mouth at bedtime. 10/14/17   Parks Ranger, Devonne Doughty, DO  OLANZapine (ZYPREXA) 5 MG tablet Take by mouth. 11/15/17 12/21/17  [provider]  omeprazole (PRILOSEC) 20 MG capsule Take 1 capsule (20 mg total) by mouth daily before breakfast. 10/14/17   Parks Ranger, Devonne Doughty, DO  ondansetron (ZOFRAN-ODT) 4 MG disintegrating tablet Take 1 tablet (4 mg total) by mouth every 8 (eight) hours as needed for nausea or vomiting.  10/14/17   Parks Ranger, Devonne Doughty, DO  paliperidone (INVEGA SUSTENNA) 234 MG/1.5ML SUSY injection Inject into the muscle. 11/27/17   [provider]  traMADol (ULTRAM) 50 MG tablet Take 1-2 tablets (50-100 mg total) by mouth every 8 (eight) hours as needed for up to 5 days. 11/29/17 12/04/17  Karamalegos, Devonne Doughty, DO  traZODone (DESYREL) 100 MG tablet Take 100 mg by mouth at bedtime.    [provider]    Allergies Patient has no known allergies.  Family History  Problem Relation Age of Onset  . Alcohol abuse Mother   . Cancer Father        colon     Social History Social History   Tobacco Use  . Smoking status: Former Research scientist (life sciences)  . Smokeless tobacco: Current User    Types: Chew  Substance Use Topics  . Alcohol use: No  . Drug use: Never     Review of Systems  Constitutional: No fever/chills Eyes: No visual changes. No discharge ENT: right upper and lower dental injury/pain/swelling Cardiovascular: no chest pain. Respiratory: no cough. No SOB. Gastrointestinal: No abdominal pain.  No nausea, no vomiting.  No diarrhea.  No constipation. Musculoskeletal: Negative for musculoskeletal pain. Skin: Negative for rash, abrasions, lacerations, ecchymosis. Neurological: Negative for headaches, focal weakness or numbness. 10-point ROS otherwise negative.  ____________________________________________   PHYSICAL EXAM:  VITAL SIGNS: ED Triage Vitals  Enc Vitals Group     BP 11/30/17 1644 125/71     Pulse Rate 11/30/17 1644 89     Resp 11/30/17 1644 16     Temp 11/30/17 1644 98.1 F (36.7 C)     Temp Source 11/30/17 1644 Oral     SpO2 11/30/17 1644 96 %     Weight 11/30/17 1645 264 lb (119.7 kg)     Height 11/30/17 1645 5\' 11"  (1.803 m)     Head Circumference --      Peak Flow --      Pain Score 11/30/17 1645 7     Pain Loc --      Pain Edu? --      Excl. in [place]? --      Constitutional: Alert and oriented. Well appearing and in no acute  distress. Eyes: Conjunctivae are normal. PERRL. EOMI. Head: Atraumatic. ENT:      Ears:       Nose: No congestion/rhinnorhea.      Mouth/Throat: Mucous membranes are moist. Multiple dental caries, erosions throughout dentition. Of note, patient has rack Sheard dentition to the right upper and right lower dentition. This occurs and teeth #3 and teeth #31 and 32. Erosions into the gumline from these dental injuries are appreciated. Mild surrounding erythema and edema. No fluctuance or induration with palpation with tongue depressor. No purulent drainage. Uvula is midline. Neck: No stridor.   Hematological/Lymphatic/Immunilogical: No cervical lymphadenopathy. Cardiovascular: Normal rate, regular rhythm. Normal S1 and S2.  Good peripheral circulation. Respiratory: Normal respiratory effort without tachypnea or retractions. Lungs CTAB. Good air entry to the bases with no decreased or absent breath sounds. Musculoskeletal: Full range of motion to all extremities. No gross deformities appreciated. Neurologic:  Normal speech and language. No gross focal neurologic  deficits are appreciated.  Skin:  Skin is warm, dry and intact. No rash noted. Psychiatric: Mood and affect are normal. Speech and behavior are normal. Patient exhibits appropriate insight and judgement.   ____________________________________________   LABS (all labs ordered are listed, but only abnormal results are displayed)  Labs Reviewed - No data to display ____________________________________________  EKG   ____________________________________________  RADIOLOGY   No results found.  ____________________________________________    PROCEDURES  Procedure(s) performed:    .Nerve Block Date/Time: 11/30/2017 5:46 PM Performed by: Darletta Moll, PA-C Authorized by: Darletta Moll, PA-C   Consent:    Consent obtained:  Verbal   Consent given by:  Patient   Risks discussed:  Unsuccessful  block Indications:    Indications:  Pain relief Location:    Body area:  Head   Head nerve blocked: Superior Alveolar.   Laterality:  Right Skin anesthesia (see MAR for exact dosages):    Skin anesthesia method:  None Procedure details (see MAR for exact dosages):    Block needle gauge:  27 G   Anesthetic injected:  Lidocaine 1% WITH epi   Steroid injected:  None   Additive injected:  None   Injection procedure:  Anatomic landmarks identified, introduced needle, negative aspiration for blood and incremental injection Post-procedure details:    Dressing:  None   Outcome:  Pain relieved   Patient tolerance of procedure:  Tolerated well, no immediate complications  .Nerve Block Date/Time: 11/30/2017 5:59 PM Performed by: Darletta Moll, PA-C Authorized by: Darletta Moll, PA-C   Consent:    Consent obtained:  Verbal   Consent given by:  Patient   Risks discussed:  Unsuccessful block Indications:    Indications:  Pain relief Location:    Body area:  Head   Head nerve blocked: Inferior Alveolar.   Laterality:  Right Procedure details (see MAR for exact dosages):    Block needle gauge:  27 G   Anesthetic injected:  Lidocaine 1% WITH epi   Steroid injected:  None   Additive injected:  None   Injection procedure:  Anatomic landmarks identified, introduced needle, negative aspiration for blood and incremental injection Post-procedure details:    Outcome:  Pain relieved   Patient tolerance of procedure:  Tolerated well, no immediate complications      Medications  lidocaine-EPINEPHrine (XYLOCAINE W/EPI) 2 %-1:100000 (with pres) injection 1.7 mL (has no administration in time range)  lidocaine-EPINEPHrine (XYLOCAINE W/EPI) 2 %-1:100000 (with pres) injection 1.7 mL (has no administration in time range)     ____________________________________________   INITIAL IMPRESSION / ASSESSMENT AND PLAN / ED COURSE  Pertinent labs & imaging results that were available  during my care of the patient were reviewed by me and considered in my medical decision making (see chart for details).  Review of the [place] CSRS was performed in accordance of the [place] prior to dispensing any controlled drugs.      Patient's diagnosis is consistent with dental pain, dental infection, fractured dentition.she presented to the emergency department complaining of right upper and lower dental pain. Patient seen his primary care and started on Ultram and amoxicillin for same. Pain continued to increase. Patient is poor dentition from previous chemotherapy and radiation treatments for lymphoma. Patient fractured dentition several days ago, had mild surrounding infection in both regions. This patient was already prescribed Pain meds and amoxicillin, I offered the patient dental blocks emergency department. he was acceptable of this treatment plan, and these were administered with  palpation. Patient is to continue medications at home. Magic mouthwash will be prescribed for additional pain relief.  Patient is given ED precautions to return to the ED for any worsening or new symptoms.     ____________________________________________  FINAL CLINICAL IMPRESSION(S) / ED DIAGNOSES  Final diagnoses:  Dental infection  Closed fracture of tooth, initial encounter      NEW MEDICATIONS STARTED DURING THIS VISIT:  ED Discharge Orders         Ordered    magic mouthwash w/lidocaine SOLN  4 times daily    Note to Pharmacy:  Dispense in a 1/1/1 ratio. Use lidocaine, diphenhydramine, prednisolone   11/30/17 1817              This chart was dictated using voice recognition software/Dragon. Despite best efforts to proofread, errors can occur which can change the meaning. Any change was purely unintentional.    Darletta Moll, PA-C 11/30/17 1818    Lavonia Drafts, MD 11/30/17 [phone]

## 2017-11-30 NOTE — ED Triage Notes (Signed)
  Pt to ED reporting right sided dental pain that he was seen for yesterday. Pt started Tramadol and Amoxicillin yesterday without relief. Pt reports pain persists. Pt does not have a dentist. No fevers.

## 2017-12-02 NOTE — Telephone Encounter (Signed)
Patient aware of results Orders placed Nothing further needed. 

## 2017-12-13 ENCOUNTER — Telehealth: Payer: Self-pay | Admitting: Internal Medicine

## 2017-12-13 DIAGNOSIS — G4733 Obstructive sleep apnea (adult) (pediatric): Secondary | ICD-10-CM

## 2017-12-13 NOTE — Telephone Encounter (Signed)
D/C order for [facility] placed.

## 2017-12-13 NOTE — Telephone Encounter (Signed)
p'ts wife called and stated that pt requested that order for CPAP be faxed to [facility] and a D/C order faxed to [facility].   I can use the same order for the CPAP but I do need a D/C order placed to [facility].  Rhonda J Cobb

## 2017-12-24 DIAGNOSIS — G4733 Obstructive sleep apnea (adult) (pediatric): Secondary | ICD-10-CM | POA: Diagnosis not present

## 2017-12-25 ENCOUNTER — Ambulatory Visit
Admit: 2017-12-25 | Discharge: 2017-12-26 | Payer: MEDICARE | Attending: Student in an Organized Health Care Education/Training Program | Primary: Student in an Organized Health Care Education/Training Program

## 2017-12-25 DIAGNOSIS — R69 Illness, unspecified: Secondary | ICD-10-CM | POA: Diagnosis not present

## 2017-12-25 DIAGNOSIS — Z79899 Other long term (current) drug therapy: Secondary | ICD-10-CM | POA: Diagnosis not present

## 2017-12-25 DIAGNOSIS — F3341 Major depressive disorder, recurrent, in partial remission: Secondary | ICD-10-CM | POA: Diagnosis not present

## 2017-12-25 DIAGNOSIS — F209 Schizophrenia, unspecified: Principal | ICD-10-CM

## 2017-12-25 MED ORDER — TRAZODONE 100 MG TABLET
ORAL_TABLET | 3 refills | 0 days | Status: CP
Start: 2017-12-25 — End: 2018-02-19
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
## 2017-12-25 NOTE — Unmapped (Signed)
Follow-up instructions:  -- Please continue taking your medications as prescribed for your mental health.   -- Do not make changes to your medications, including taking more or less than prescribed, unless under the supervision of your physician. Be aware that some medications may make you feel worse if abruptly stopped  -- Please refrain from using illicit substances, as these can affect your mood and could cause anxiety or other concerning symptoms.   -- Seek further medical care for any increase in symptoms or new symptoms such as thoughts of wanting to hurt yourself or hurt others.     Contact info:  Life-threatening emergencies: call 911 or go to the nearest ER for medical or psychiatric attention.     Issues that need urgent attention but are not life threatening: call the clinic at [phone] or the outpatient nurses line [phone] for assistance.     Non-urgent routine concerns, questions, and refill requests: please call [phone] for assistance.     Regarding appointments:  - If you need to cancel your appointment, we ask that you call [phone] at least 24 hours before your scheduled appointment.  - If for any reason you arrive 15 minutes later than your scheduled appointment time, you may not be seen and your visit may be rescheduled.  - Please remember that we will not automatically reschedule missed appointments.  - If you miss two (2) appointments without letting us know in advance, you will likely be referred to a provider in your community.  - We will do our best to be on time. Sometimes an emergency will arise that might cause your clinician to be late. We will try to inform you of this when you check in for your appointment. If you wait more than 15 minutes past your appointment time without such notice, please speak with the front desk staff.    In the event of bad weather, the clinic staff will attempt to contact you, should your appointment need to be rescheduled. Additionally, you can call the Patient Weather Line [phone] for system-wide clinic status    For more information and reminders regarding clinic policies (these were provided when you were admitted to the clinic), please ask the front desk.

## 2017-12-25 NOTE — Unmapped (Signed)
I saw and evaluated the patient, participating in the key portions of the service.  I reviewed the resident???s note.  I agree with the resident???s findings and plan. Ann Bohne, MD

## 2018-01-01 MED FILL — INVEGA SUSTENNA 234 MG/1.5 ML INTRAMUSCULAR SYRINGE: 28 days supply | Qty: 2 | Fill #0 | Status: AC

## 2018-01-03 ENCOUNTER — Institutional Professional Consult (permissible substitution): Admit: 2018-01-03 | Discharge: 2018-01-04 | Payer: MEDICARE | Attending: Psychiatry | Primary: Psychiatry

## 2018-01-03 DIAGNOSIS — R69 Illness, unspecified: Secondary | ICD-10-CM | POA: Diagnosis not present

## 2018-01-03 DIAGNOSIS — F209 Schizophrenia, unspecified: Principal | ICD-10-CM

## 2018-01-03 NOTE — Unmapped (Signed)
Administered long-acting injectable today, 01/03/2018. See MAR for additional documentation. Meryl Dare, CMA

## 2018-01-13 DIAGNOSIS — R69 Illness, unspecified: Secondary | ICD-10-CM | POA: Diagnosis not present

## 2018-01-24 DIAGNOSIS — G4733 Obstructive sleep apnea (adult) (pediatric): Secondary | ICD-10-CM | POA: Diagnosis not present

## 2018-01-28 ENCOUNTER — Ambulatory Visit: Payer: Medicare HMO | Attending: Internal Medicine

## 2018-02-19 ENCOUNTER — Ambulatory Visit
Admit: 2018-02-19 | Discharge: 2018-02-20 | Payer: MEDICARE | Attending: Student in an Organized Health Care Education/Training Program | Primary: Student in an Organized Health Care Education/Training Program

## 2018-02-19 DIAGNOSIS — F3341 Major depressive disorder, recurrent, in partial remission: Secondary | ICD-10-CM | POA: Diagnosis not present

## 2018-02-19 DIAGNOSIS — R69 Illness, unspecified: Secondary | ICD-10-CM | POA: Diagnosis not present

## 2018-02-19 DIAGNOSIS — Z79899 Other long term (current) drug therapy: Secondary | ICD-10-CM | POA: Diagnosis not present

## 2018-02-19 DIAGNOSIS — F209 Schizophrenia, unspecified: Principal | ICD-10-CM

## 2018-02-19 LAB — LDL CHOLESTEROL CALCULATED: Cholesterol.in LDL:MCnc:Pt:Ser/Plas:Qn:Calculated: 113 — ABNORMAL HIGH

## 2018-02-19 LAB — LIPID PANEL
CHOLESTEROL/HDL RATIO SCREEN: 3.2 (ref <5.0)
CHOLESTEROL: 217 mg/dL — ABNORMAL HIGH (ref 100–199)
HDL CHOLESTEROL: 68 mg/dL — ABNORMAL HIGH (ref 40–59)
LDL CHOLESTEROL CALCULATED: 113 mg/dL — ABNORMAL HIGH (ref 60–99)
NON-HDL CHOLESTEROL: 149 mg/dL
TRIGLYCERIDES: 178 mg/dL — ABNORMAL HIGH (ref 1–149)
VLDL CHOLESTEROL CAL: 35.6 mg/dL (ref 11–50)

## 2018-02-19 MED ORDER — TRAZODONE 100 MG TABLET
ORAL_TABLET | 3 refills | 0 days | Status: CP
Start: 2018-02-19 — End: 2018-04-23

## 2018-02-19 MED ORDER — PALIPERIDONE PALMITATE 234 MG/1.5 ML INTRAMUSCULAR SYRINGE
INTRAMUSCULAR | 5 refills | 0.00000 days | Status: CP
Start: 2018-02-19 — End: 2018-08-20
  Filled 2018-04-02: qty 1.5, 28d supply, fill #0
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
## 2018-02-19 NOTE — Unmapped (Signed)
Labs drawn from patients right AC. One stick. Patient tolerated well.    Roselind Messier, CMA

## 2018-02-20 LAB — ESTIMATED AVERAGE GLUCOSE: Estimated average glucose:MCnc:Pt:Bld:Qn:Estimated from glycated hemoglobin: 126

## 2018-02-20 NOTE — Unmapped (Signed)
I saw and evaluated the patient, participating in the key portions of the service.  I reviewed the resident???s note.  I agree with the resident???s findings and plan. Catalina Pizza, MD

## 2018-02-23 DIAGNOSIS — G4733 Obstructive sleep apnea (adult) (pediatric): Secondary | ICD-10-CM | POA: Diagnosis not present

## 2018-03-06 MED ORDER — OLANZAPINE 5 MG TABLET
ORAL_TABLET | Freq: Every evening | ORAL | 0 refills | 0.00000 days | Status: CP
Start: 2018-03-06 — End: 2018-03-31

## 2018-03-24 ENCOUNTER — Ambulatory Visit: Payer: Medicare HMO | Admitting: Internal Medicine

## 2018-04-01 ENCOUNTER — Encounter: Payer: Self-pay | Admitting: Internal Medicine

## 2018-04-01 ENCOUNTER — Ambulatory Visit (INDEPENDENT_AMBULATORY_CARE_PROVIDER_SITE_OTHER): Payer: Medicare HMO | Admitting: Internal Medicine

## 2018-04-01 VITALS — BP 122/70 | HR 71 | Ht 71.0 in | Wt 260.6 lb

## 2018-04-01 DIAGNOSIS — J449 Chronic obstructive pulmonary disease, unspecified: Secondary | ICD-10-CM

## 2018-04-01 DIAGNOSIS — G4733 Obstructive sleep apnea (adult) (pediatric): Secondary | ICD-10-CM

## 2018-04-01 MED ORDER — OLANZAPINE 5 MG TABLET
ORAL_TABLET | Freq: Every evening | ORAL | 0 refills | 0.00000 days | Status: CP
Start: 2018-04-01 — End: 2018-04-23

## 2018-04-01 NOTE — Progress Notes (Signed)
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  Encouraged him to restart CPAP, as the medication does not treat sleep apnea.  Insomnia. - Currently on trazodone 100 mg nightly to continue for the time being.   COPD.  - Per patient he has a diagnosis of COPD, has dyspnea on exertion, former smoker. -Use Dulera 2 puffs twice daily.   Return in about 6 weeks (around 05/13/2018).    Date: 04/01/2018  MRN# [id number] Randy [name] [birth year]   Randy [name] is a 48 y.o. old male seen in consultation for chief complaint of:    Chief Complaint  Patient presents with  . Follow-up    wants to talk about d/c the CPAP    HPI:  The patient is a 48 year old male who had been having trouble sleeping through the night.  He had previously been diagnosed with sleep apnea but stopped using CPAP about 10 years ago due to insurance longer paying for it.  He has symptoms of insomnia treated with trazodone 100 mg.   **HST 11/20/2017>> moderate sleep apnea with AHI of 15.  Recommended auto CPAP with pressure range 5-15.  Medication:    Current Outpatient Medications:  .  albuterol (PROAIR HFA) 108 (90 Base) MCG/ACT inhaler, Inhale 1-2 puffs into the lungs every 6 (six) hours as needed for wheezing or shortness of breath., Disp: 1 Inhaler, Rfl: 5 .  albuterol (PROVENTIL HFA;VENTOLIN HFA) 108 (90 Base) MCG/ACT inhaler, Inhale 2 puffs into the lungs every 4 (four) hours as needed for wheezing or shortness of breath (cough)., Disp: 1 Inhaler, Rfl: 3 .  amoxicillin-clavulanate (AUGMENTIN) 875-125 MG tablet, Take 1 tablet by mouth 2 (two) times daily. For 10 days, Disp: 20 tablet, Rfl: 0 .  atorvastatin (LIPITOR) 20 MG tablet, Take 1 tablet (20 mg total) by mouth daily., Disp: 90 tablet, Rfl: 1 .  carbamide peroxide  (DEBROX) 6.5 % OTIC solution, Administer 5 drops into both ears Two (2) times a day., Disp: , Rfl:  .  cetirizine (ZYRTEC) 10 MG tablet, Take 1 tablet (10 mg total) by mouth daily., Disp: 30 tablet, Rfl: 11 .  FLUoxetine (PROZAC) 20 MG tablet, Take 20 mg by mouth daily., Disp: , Rfl:  .  fluticasone (FLONASE) 50 MCG/ACT nasal spray, Place 1 spray into both nostrils 2 (two) times daily., Disp: 48 g, Rfl: 1 .  ipratropium (ATROVENT HFA) 17 MCG/ACT inhaler, Inhale into the lungs., Disp: , Rfl:  .  magic mouthwash w/lidocaine SOLN, Take 5 mLs by mouth 4 (four) times daily., Disp: 240 mL, Rfl: 0 .  Melatonin 3 MG TABS, Take by mouth., Disp: , Rfl:  .  mometasone-formoterol (DULERA) 100-5 MCG/ACT AERO, Inhale 2 puffs into the lungs 2 (two) times daily., Disp: 1 Inhaler, Rfl: 0 .  montelukast (SINGULAIR) 10 MG tablet, Take 1 tablet (10 mg total) by mouth at bedtime., Disp: 90 tablet, Rfl: 1 .  OLANZapine (ZYPREXA) 5 MG tablet, Take by mouth., Disp: , Rfl:  .  omeprazole (PRILOSEC) 20 MG capsule, Take 1 capsule (20 mg total) by mouth daily before breakfast., Disp: 90 capsule, Rfl: 1 .  ondansetron (ZOFRAN-ODT) 4 MG disintegrating tablet, Take 1 tablet (4 mg total) by mouth every 8 (eight) hours as needed for nausea or vomiting., Disp: 30 tablet,  Rfl: 2 .  paliperidone (INVEGA SUSTENNA) 234 MG/1.5ML SUSY injection, Inject into the muscle., Disp: , Rfl:  .  traZODone (DESYREL) 100 MG tablet, Take 100 mg by mouth at bedtime., Disp: , Rfl:    Allergies:  Patient has no known allergies.      LABORATORY PANEL:   CBC No results for input(s): WBC, HGB, HCT, PLT in the last 168 hours. ------------------------------------------------------------------------------------------------------------------  Chemistries  No results for input(s): NA, K, CL, CO2, GLUCOSE, BUN, CREATININE, CALCIUM, MG, AST, ALT, ALKPHOS, BILITOT in the last 168 hours.  Invalid input(s):  GFRCGP ------------------------------------------------------------------------------------------------------------------  Cardiac Enzymes No results for input(s): TROPONINI in the last 168 hours. ------------------------------------------------------------  RADIOLOGY:  No results found.     Thank  you for the consultation and for allowing [facility] to assist in the care of your patient. Our recommendations are noted above.  Please contact us if we can be of further service.   Marda Stalker, M.D., F.C.C.P.  Board Certified in Internal Medicine, Pulmonary Medicine, [place], and Sleep Medicine.  [facility] Office Number: [phone]   04/01/2018

## 2018-04-01 NOTE — Patient Instructions (Addendum)
You should restart CPAP, try to use it every night for the entire night. Use Dulera 2 puffs twice daily, do not use more than that.

## 2018-04-02 MED FILL — INVEGA SUSTENNA 234 MG/1.5 ML INTRAMUSCULAR SYRINGE: 28 days supply | Qty: 2 | Fill #0 | Status: AC

## 2018-04-07 ENCOUNTER — Other Ambulatory Visit: Payer: Self-pay | Admitting: Family Medicine

## 2018-04-07 DIAGNOSIS — J3089 Other allergic rhinitis: Secondary | ICD-10-CM

## 2018-04-07 DIAGNOSIS — J432 Centrilobular emphysema: Secondary | ICD-10-CM

## 2018-04-07 DIAGNOSIS — K219 Gastro-esophageal reflux disease without esophagitis: Secondary | ICD-10-CM

## 2018-04-09 ENCOUNTER — Other Ambulatory Visit: Payer: Self-pay | Admitting: Family Medicine

## 2018-04-09 DIAGNOSIS — E782 Mixed hyperlipidemia: Secondary | ICD-10-CM

## 2018-04-23 ENCOUNTER — Ambulatory Visit
Admit: 2018-04-23 | Discharge: 2018-04-24 | Payer: MEDICARE | Attending: Student in an Organized Health Care Education/Training Program | Primary: Student in an Organized Health Care Education/Training Program

## 2018-04-23 DIAGNOSIS — R69 Illness, unspecified: Secondary | ICD-10-CM | POA: Diagnosis not present

## 2018-04-23 DIAGNOSIS — Z79899 Other long term (current) drug therapy: Secondary | ICD-10-CM | POA: Diagnosis not present

## 2018-04-23 DIAGNOSIS — F339 Major depressive disorder, recurrent, unspecified: Secondary | ICD-10-CM | POA: Diagnosis not present

## 2018-04-23 DIAGNOSIS — F209 Schizophrenia, unspecified: Principal | ICD-10-CM

## 2018-04-23 MED ORDER — TRAZODONE 100 MG TABLET
ORAL_TABLET | 3 refills | 0 days | Status: CP
Start: 2018-04-23 — End: 2018-08-13

## 2018-04-23 MED ORDER — FLUOXETINE 20 MG CAPSULE
ORAL_CAPSULE | Freq: Every day | ORAL | 3 refills | 0.00000 days | Status: CP
Start: 2018-04-23 — End: 2018-08-13

## 2018-04-30 MED ORDER — OLANZAPINE 5 MG TABLET
ORAL_TABLET | Freq: Every evening | ORAL | 0 refills | 0 days | Status: CP
Start: 2018-04-30 — End: 2018-07-01

## 2018-05-06 NOTE — Progress Notes (Signed)
[facility] Consultation      Assessment and Plan:  OSA. - Moderate obstructive sleep apnea with AHI of 15. - Continue CPAP pressure range 5-15.  Insomnia. - Currently on trazodone 100 mg nightly to continue for the time being.  Uses this once or twice a week.   COPD. - Per patient he has a diagnosis of COPD, has dyspnea on exertion, former smoker. -Patient has been using Dulera as a rescue inhaler, I reiterated the importance of using it twice per day only and rinsing mouth after use.   Return in about 1 year (around 05/08/2019).    Date: 05/06/2018  MRN# [id number] Randy [name] May 01, 1970   Randy [name] is a 48 y.o. old male seen in consultation for chief complaint of:    Chief Complaint  Patient presents with  . Sleep Apnea  . COPD    HPI:  The patient is a 48 year old male who had been having trouble sleeping through the night.  He had previously been diagnosed with sleep apnea but stopped using CPAP about 10 years ago due to insurance longer paying for it.  He has symptoms of insomnia treated with trazodone 100 mg.  At last visit he was asked to restart using CPAP. He has been using cpap every night for about 8 hours per night and feels that he is more awake during the day. He has continued on trazodone, about twice per week.  He feels that his breathing has been doing ok. He has been using dulera 2 puffs every 6 hours instead of twice daily.  He chews tobacco, is not thinking of quitting.   **HST 11/20/2017>> moderate sleep apnea with AHI of 15.  Recommended auto CPAP with pressure range 5-15.  Medication:    Current Outpatient Medications:  .  albuterol (PROAIR HFA) 108 (90 Base) MCG/ACT inhaler, Inhale 1-2 puffs into the lungs every 6 (six) hours as needed for wheezing or shortness of breath., Disp: 1 Inhaler, Rfl: 5 .  albuterol (PROVENTIL HFA;VENTOLIN HFA) 108 (90 Base) MCG/ACT inhaler, Inhale 2 puffs into the lungs every 4 (four) hours as  needed for wheezing or shortness of breath (cough)., Disp: 1 Inhaler, Rfl: 3 .  atorvastatin (LIPITOR) 20 MG tablet, TAKE 1 TABLET BY MOUTH EVERY DAY, Disp: 90 tablet, Rfl: 1 .  carbamide peroxide (DEBROX) 6.5 % OTIC solution, Administer 5 drops into both ears Two (2) times a day., Disp: , Rfl:  .  cetirizine (ZYRTEC) 10 MG tablet, Take 1 tablet (10 mg total) by mouth daily., Disp: 30 tablet, Rfl: 11 .  FLUoxetine (PROZAC) 20 MG tablet, Take 20 mg by mouth daily., Disp: , Rfl:  .  fluticasone (FLONASE) 50 MCG/ACT nasal spray, PLACE 1 SPRAY INTO BOTH NOSTRILS 2 (TWO) TIMES DAILY., Disp: 48 g, Rfl: 1 .  ipratropium (ATROVENT HFA) 17 MCG/ACT inhaler, Inhale into the lungs., Disp: , Rfl:  .  magic mouthwash w/lidocaine SOLN, Take 5 mLs by mouth 4 (four) times daily., Disp: 240 mL, Rfl: 0 .  Melatonin 3 MG TABS, Take by mouth., Disp: , Rfl:  .  mometasone-formoterol (DULERA) 100-5 MCG/ACT AERO, Inhale 2 puffs into the lungs 2 (two) times daily., Disp: 1 Inhaler, Rfl: 0 .  montelukast (SINGULAIR) 10 MG tablet, TAKE 1 TABLET BY MOUTH EVERYDAY AT BEDTIME, Disp: 90 tablet, Rfl: 1 .  OLANZapine (ZYPREXA) 5 MG tablet, Take by mouth., Disp: , Rfl:  .  omeprazole (PRILOSEC) 20 MG capsule, TAKE 1 CAPSULE (20 MG  TOTAL) BY MOUTH DAILY BEFORE BREAKFAST., Disp: 90 capsule, Rfl: 1 .  ondansetron (ZOFRAN-ODT) 4 MG disintegrating tablet, Take 1 tablet (4 mg total) by mouth every 8 (eight) hours as needed for nausea or vomiting., Disp: 30 tablet, Rfl: 2 .  paliperidone (INVEGA SUSTENNA) 234 MG/1.5ML SUSY injection, Inject into the muscle., Disp: , Rfl:  .  traZODone (DESYREL) 100 MG tablet, Take 100 mg by mouth at bedtime., Disp: , Rfl:    Allergies:  Patient has no known allergies.    Review of Systems:  Constitutional: Feels well. Cardiovascular: Denies chest pain, exertional chest pain.  Pulmonary: Denies hemoptysis, pleuritic chest pain.   The remainder of systems were reviewed and were found to be negative  other than what is documented in the HPI.    Physical Examination:   VS: BP 110/78 (BP Location: Left Arm, Cuff Size: Large)   Pulse 89   Resp 16   Ht 5\' 11"  (1.803 m)   Wt 262 lb (118.8 kg)   SpO2 95%   BMI 36.54 kg/m   General Appearance: No distress  Neuro:without focal findings, mental status, speech normal, alert and oriented HEENT: PERRLA, EOM intact Pulmonary: No wheezing, No rales  CardiovascularNormal S1,S2.  No m/r/g.  Abdomen: Benign, Soft, non-tender, No masses Renal:  No costovertebral tenderness  GU:  No performed at this time. Endoc: No evident thyromegaly, no signs of acromegaly or Cushing features Skin:   warm, no rashes, no ecchymosis  Extremities: normal, no cyanosis, clubbing.    LABORATORY PANEL:   CBC No results for input(s): WBC, HGB, HCT, PLT in the last 168 hours. ------------------------------------------------------------------------------------------------------------------  Chemistries  No results for input(s): NA, K, CL, CO2, GLUCOSE, BUN, CREATININE, CALCIUM, MG, AST, ALT, ALKPHOS, BILITOT in the last 168 hours.  Invalid input(s): GFRCGP ------------------------------------------------------------------------------------------------------------------  Cardiac Enzymes No results for input(s): TROPONINI in the last 168 hours. ------------------------------------------------------------  RADIOLOGY:  No results found.     Thank  you for the consultation and for allowing [facility] to assist in the care of your patient. Our recommendations are noted above.  Please contact us if we can be of further service.   Marda Stalker, M.D., F.C.C.P.  Board Certified in Internal Medicine, Pulmonary Medicine, [place], and Sleep Medicine.  [facility] Office Number: [phone]   05/06/2018

## 2018-05-07 ENCOUNTER — Ambulatory Visit (INDEPENDENT_AMBULATORY_CARE_PROVIDER_SITE_OTHER): Payer: Medicare HMO | Admitting: Internal Medicine

## 2018-05-07 ENCOUNTER — Encounter: Payer: Self-pay | Admitting: Internal Medicine

## 2018-05-07 VITALS — BP 110/78 | HR 89 | Resp 16 | Ht 71.0 in | Wt 262.0 lb

## 2018-05-07 DIAGNOSIS — J449 Chronic obstructive pulmonary disease, unspecified: Secondary | ICD-10-CM

## 2018-05-07 DIAGNOSIS — G4733 Obstructive sleep apnea (adult) (pediatric): Secondary | ICD-10-CM

## 2018-05-07 NOTE — Patient Instructions (Addendum)
Continue to use cpap every night.  Use your dulera 2 puffs TWICE DAILY ONLY. Rinse mouth after use.

## 2018-05-19 ENCOUNTER — Ambulatory Visit: Payer: Medicare HMO | Admitting: Family Medicine

## 2018-05-20 ENCOUNTER — Ambulatory Visit (INDEPENDENT_AMBULATORY_CARE_PROVIDER_SITE_OTHER): Payer: Medicare HMO | Admitting: Family Medicine

## 2018-05-20 ENCOUNTER — Other Ambulatory Visit: Payer: Self-pay

## 2018-05-20 ENCOUNTER — Ambulatory Visit (INDEPENDENT_AMBULATORY_CARE_PROVIDER_SITE_OTHER): Payer: Medicare HMO

## 2018-05-20 ENCOUNTER — Encounter: Payer: Self-pay | Admitting: Family Medicine

## 2018-05-20 VITALS — BP 136/82 | HR 66 | Temp 98.7°F | Resp 16 | Ht 71.0 in | Wt 264.8 lb

## 2018-05-20 VITALS — BP 136/82 | HR 91 | Temp 98.7°F | Resp 16 | Ht 71.0 in | Wt 264.6 lb

## 2018-05-20 DIAGNOSIS — Z0000 Encounter for general adult medical examination without abnormal findings: Secondary | ICD-10-CM | POA: Diagnosis not present

## 2018-05-20 DIAGNOSIS — G4733 Obstructive sleep apnea (adult) (pediatric): Secondary | ICD-10-CM | POA: Diagnosis not present

## 2018-05-20 DIAGNOSIS — Z9989 Dependence on other enabling machines and devices: Secondary | ICD-10-CM | POA: Diagnosis not present

## 2018-05-20 DIAGNOSIS — F339 Major depressive disorder, recurrent, unspecified: Secondary | ICD-10-CM

## 2018-05-20 DIAGNOSIS — J432 Centrilobular emphysema: Secondary | ICD-10-CM | POA: Diagnosis not present

## 2018-05-20 DIAGNOSIS — R7309 Other abnormal glucose: Secondary | ICD-10-CM | POA: Diagnosis not present

## 2018-05-20 DIAGNOSIS — E669 Obesity, unspecified: Secondary | ICD-10-CM

## 2018-05-20 DIAGNOSIS — F209 Schizophrenia, unspecified: Secondary | ICD-10-CM

## 2018-05-20 DIAGNOSIS — R69 Illness, unspecified: Secondary | ICD-10-CM | POA: Diagnosis not present

## 2018-05-20 DIAGNOSIS — S90121A Contusion of right lesser toe(s) without damage to nail, initial encounter: Secondary | ICD-10-CM | POA: Diagnosis not present

## 2018-05-20 LAB — POCT GLYCOSYLATED HEMOGLOBIN (HGB A1C): HEMOGLOBIN A1C: 6.3 % — AB (ref 4.0–5.6)

## 2018-05-20 NOTE — Patient Instructions (Signed)
Randy [name] , Thank you for taking time to come for your Medicare Wellness Visit. I appreciate your ongoing commitment to your health goals. Please review the following plan we discussed and let me know if I can assist you in the future.   Screening recommendations/referrals: Colonoscopy: completed 04/12/2017, due now (per result repeat in one year) please call your GI office to schedule Recommended yearly ophthalmology/optometry visit for glaucoma screening and checkup Recommended yearly dental visit for hygiene and checkup  Vaccinations: Influenza vaccine: up to date  Pneumococcal vaccine: due at age 26 Tdap vaccine: due check with your insurance company for coverage  Shingles vaccine: shingrix eligible, check with your insurance company for coverage  Advanced directives: Advance directive discussed with you today. Even though you declined this today please call our office should you change your mind and we can give you the proper paperwork for you to fill out.  Conditions/risks identified: Family history of colon cancer   Next appointment: Follow up in one year for your annual wellness exam.  Preventive Care 40-64 Years, Male Preventive care refers to lifestyle choices and visits with your health care provider that can promote health and wellness. What does preventive care include?  A yearly physical exam. This is also called an annual well check.  Dental exams once or twice a year.  Routine eye exams. Ask your health care provider how often you should have your eyes checked.  Personal lifestyle choices, including:  Daily care of your teeth and gums.  Regular physical activity.  Eating a healthy diet.  Avoiding tobacco and drug use.  Limiting alcohol use.  Practicing safe sex.  Taking low-dose aspirin every day starting at age 50. What happens during an annual well check? The services and screenings done by your health care provider during your annual well check will  depend on your age, overall health, lifestyle risk factors, and family history of disease. Counseling  Your health care provider may ask you questions about your:  Alcohol use.  Tobacco use.  Drug use.  Emotional well-being.  Home and relationship well-being.  Sexual activity.  Eating habits.  Work and work Statistician. Screening  You may have the following tests or measurements:  Height, weight, and BMI.  Blood pressure.  Lipid and cholesterol levels. These may be checked every 5 years, or more frequently if you are over 81 years old.  Skin check.  Lung cancer screening. You may have this screening every year starting at age 19 if you have a 30-pack-year history of smoking and currently smoke or have quit within the past 15 years.  Fecal occult blood test (FOBT) of the stool. You may have this test every year starting at age 24.  Flexible sigmoidoscopy or colonoscopy. You may have a sigmoidoscopy every 5 years or a colonoscopy every 10 years starting at age 76.  Prostate cancer screening. Recommendations will vary depending on your family history and other risks.  Hepatitis C blood test.  Hepatitis B blood test.  Sexually transmitted disease (STD) testing.  Diabetes screening. This is done by checking your blood sugar (glucose) after you have not eaten for a while (fasting). You may have this done every 1-3 years. Discuss your test results, treatment options, and if necessary, the need for more tests with your health care provider. Vaccines  Your health care provider may recommend certain vaccines, such as:  Influenza vaccine. This is recommended every year.  Tetanus, diphtheria, and acellular pertussis (Tdap, Td) vaccine. You may need  a Td booster every 10 years.  Zoster vaccine. You may need this after age 37.  Pneumococcal 13-valent conjugate (PCV13) vaccine. You may need this if you have certain conditions and have not been vaccinated.  Pneumococcal  polysaccharide (PPSV23) vaccine. You may need one or two doses if you smoke cigarettes or if you have certain conditions. Talk to your health care provider about which screenings and vaccines you need and how often you need them. This information is not intended to replace advice given to you by your health care provider. Make sure you discuss any questions you have with your health care provider. Document Released: 04/08/2015 Document Revised: 11/30/2015 Document Reviewed: 01/11/2015 Elsevier Interactive Patient Education  2017 [place] Prevention in the Home Falls can cause injuries. They can happen to people of all ages. There are many things you can do to make your home safe and to help prevent falls. What can I do on the outside of my home?  Regularly fix the edges of walkways and driveways and fix any cracks.  Remove anything that might make you trip as you walk through a door, such as a raised step or threshold.  Trim any bushes or trees on the path to your home.  Use bright outdoor lighting.  Clear any walking paths of anything that might make someone trip, such as rocks or tools.  Regularly check to see if handrails are loose or broken. Make sure that both sides of any steps have handrails.  Any raised decks and porches should have guardrails on the edges.  Have any leaves, snow, or ice cleared regularly.  Use sand or salt on walking paths during winter.  Clean up any spills in your garage right away. This includes oil or grease spills. What can I do in the bathroom?  Use night lights.  Install grab bars by the toilet and in the tub and shower. Do not use towel bars as grab bars.  Use non-skid mats or decals in the tub or shower.  If you need to sit down in the shower, use a plastic, non-slip stool.  Keep the floor dry. Clean up any water that spills on the floor as soon as it happens.  Remove soap buildup in the tub or shower regularly.  Attach bath  mats securely with double-sided non-slip rug tape.  Do not have throw rugs and other things on the floor that can make you trip. What can I do in the bedroom?  Use night lights.  Make sure that you have a light by your bed that is easy to reach.  Do not use any sheets or blankets that are too big for your bed. They should not hang down onto the floor.  Have a firm chair that has side arms. You can use this for support while you get dressed.  Do not have throw rugs and other things on the floor that can make you trip. What can I do in the kitchen?  Clean up any spills right away.  Avoid walking on wet floors.  Keep items that you use a lot in easy-to-reach places.  If you need to reach something above you, use a strong step stool that has a grab bar.  Keep electrical cords out of the way.  Do not use floor polish or wax that makes floors slippery. If you must use wax, use non-skid floor wax.  Do not have throw rugs and other things on the floor that can make  you trip. What can I do with my stairs?  Do not leave any items on the stairs.  Make sure that there are handrails on both sides of the stairs and use them. Fix handrails that are broken or loose. Make sure that handrails are as long as the stairways.  Check any carpeting to make sure that it is firmly attached to the stairs. Fix any carpet that is loose or worn.  Avoid having throw rugs at the top or bottom of the stairs. If you do have throw rugs, attach them to the floor with carpet tape.  Make sure that you have a light switch at the top of the stairs and the bottom of the stairs. If you do not have them, ask someone to add them for you. What else can I do to help prevent falls?  Wear shoes that:  Do not have high heels.  Have rubber bottoms.  Are comfortable and fit you well.  Are closed at the toe. Do not wear sandals.  If you use a stepladder:  Make sure that it is fully opened. Do not climb a closed  stepladder.  Make sure that both sides of the stepladder are locked into place.  Ask someone to hold it for you, if possible.  Clearly mark and make sure that you can see:  Any grab bars or handrails.  First and last steps.  Where the edge of each step is.  Use tools that help you move around (mobility aids) if they are needed. These include:  Canes.  Walkers.  Scooters.  Crutches.  Turn on the lights when you go into a dark area. Replace any light bulbs as soon as they burn out.  Set up your furniture so you have a clear path. Avoid moving your furniture around.  If any of your floors are uneven, fix them.  If there are any pets around you, be aware of where they are.  Review your medicines with your doctor. Some medicines can make you feel dizzy. This can increase your chance of falling. Ask your doctor what other things that you can do to help prevent falls. This information is not intended to replace advice given to you by your health care provider. Make sure you discuss any questions you have with your health care provider. Document Released: 01/06/2009 Document Revised: 08/18/2015 Document Reviewed: 04/16/2014 Elsevier Interactive Patient Education  2017 [employer].

## 2018-05-20 NOTE — Patient Instructions (Addendum)
Thank you for coming to the office today.  Recent Labs    11/07/17 0830 05/20/18 1448  HGBA1C 6.1* 6.3*    Slightly increased blood sugar A1c. Keep trying to improve diet, low carb, low sugar. Handout given with diet recommendations.  Goal to avoid diabetes, if A1c < 6.5.  We can consider a new sugar medication in future if needed.  For Right little toe, may have a small fracture but it is intact and it can move, try using ice packs for bruising and swelling, and elevation of leg and foot. As well to help.   Firm bottom shoe is best way to protect this and even if there was an injury it would gradually heal.  Recommend to start taking Tylenol Extra Strength 500mg  tabs - take 1 to 2 tabs per dose (max 1000mg ) every 6-8 hours for pain (take regularly, don't skip a dose for next 7 days), max 24 hour daily dose is 6 tablets or 3000mg . In the future you can repeat the same everyday Tylenol course for 1-2 weeks at a time.   If not improving or cannot walk on it we can come back for an X-ray if need.  --------  Go ahead and call [facility] specialist to schedule your next colonoscopy due anytime now in 2020. Last was done 04/12/17 they asked you to come back in 1 year for polyps.  [facility] [place]  [address]  [place], [place] [address]  [phone]  DUE for FASTING BLOOD WORK (no food or drink after midnight before the lab appointment, only water or coffee without cream/sugar on the morning of)  SCHEDULE "Lab Only" visit in the morning at the clinic for lab draw in 6 MONTHS   - Make sure Lab Only appointment is at about 1 week before your next appointment, so that results will be available  For Lab Results, once available within 2-3 days of blood draw, you can can log in to MyChart online to view your results and a brief explanation. Also, we can discuss results at next follow-up visit.   Please schedule a Follow-up Appointment to: Return in about 6  months (around 11/18/2018) for Annual Physical.  If you have any other questions or concerns, please feel free to call the office or send a message through [facility]. You may also schedule an earlier appointment if necessary.  Additionally, you may be receiving a survey about your experience at our office within a few days to 1 week by e-mail or mail. We value your feedback.  [name], DO [place]

## 2018-05-20 NOTE — Progress Notes (Signed)
Subjective:   Randy [name] is a 48 y.o. male who presents for an Initial Medicare Annual Wellness Visit.  Review of Systems   Cardiac Risk Factors include: advanced age (>39men, >66 women);dyslipidemia;male gender;obesity (BMI >30kg/m2);smoking/ tobacco exposure    Objective:    Today's Vitals   05/20/18 1337  BP: 136/82  Pulse: 66  Resp: 16  Temp: 98.7 F (37.1 C)  TempSrc: Oral  Weight: 264 lb 12.8 oz (120.1 kg)  Height: 5\' 11"  (1.803 m)   Body mass index is 36.93 kg/m.  Advanced Directives 05/20/2018 11/30/2017 03/20/2016  Does Patient Have a Medical Advance Directive? No No No  Would patient like information on creating a medical advance directive? No - Patient declined No - Patient declined -    Current Medications (verified) Outpatient Encounter Medications as of 05/20/2018  Medication Sig  . albuterol (PROAIR HFA) 108 (90 Base) MCG/ACT inhaler Inhale 1-2 puffs into the lungs every 6 (six) hours as needed for wheezing or shortness of breath.  [name] atorvastatin (LIPITOR) 20 MG tablet TAKE 1 TABLET BY MOUTH EVERY DAY  . carbamide peroxide (DEBROX) 6.5 % OTIC solution Administer 5 drops into both ears Two (2) times a day.  . cetirizine (ZYRTEC) 10 MG tablet Take 1 tablet (10 mg total) by mouth daily.  [name] FLUoxetine (PROZAC) 20 MG tablet Take 20 mg by mouth daily.  . fluticasone (FLONASE) 50 MCG/ACT nasal spray PLACE 1 SPRAY INTO BOTH NOSTRILS 2 (TWO) TIMES DAILY.  [name] ipratropium (ATROVENT HFA) 17 MCG/ACT inhaler Inhale into the lungs.  . magic mouthwash w/lidocaine SOLN Take 5 mLs by mouth 4 (four) times daily.  . Melatonin 3 MG TABS Take by mouth.  . montelukast (SINGULAIR) 10 MG tablet TAKE 1 TABLET BY MOUTH EVERYDAY AT BEDTIME  . OLANZapine (ZYPREXA) 5 MG tablet Take by mouth.  [name] omeprazole (PRILOSEC) 20 MG capsule TAKE 1 CAPSULE (20 MG TOTAL) BY MOUTH DAILY BEFORE BREAKFAST.  [name] ondansetron (ZOFRAN-ODT) 4 MG disintegrating tablet Take 1 tablet (4 mg total) by mouth  every 8 (eight) hours as needed for nausea or vomiting.  . paliperidone (INVEGA SUSTENNA) 234 MG/1.5ML SUSY injection Inject into the muscle.  . traZODone (DESYREL) 100 MG tablet Take 100 mg by mouth at bedtime.   No facility-administered encounter medications on file as of 05/20/2018.     Allergies (verified) Patient has no known allergies.   History: Past Medical History:  Diagnosis Date  . Allergy   . COPD (chronic obstructive pulmonary disease) ([place])   . GERD (gastroesophageal reflux disease)    History reviewed. No pertinent surgical history. Family History  Problem Relation Age of Onset  . Alcohol abuse Mother   . Cancer Father        colon   . Colon cancer Father    Social History   Socioeconomic History  . Marital status: Married    Spouse name: Not on file  . Number of children: Not on file  . Years of education: Not on file  . Highest education level: 11th grade  Occupational History  . Occupation: disability   Social Needs  . Financial resource strain: Not hard at all  . Food insecurity:    Worry: Never true    Inability: Never true  . Transportation needs:    Medical: No    Non-medical: No  Tobacco Use  . Smoking status: Former Smoker    Packs/day: 0.00    Years: 0.00    Pack years: 0.00  .  Smokeless tobacco: Current User    Types: Chew  Substance and Sexual Activity  . Alcohol use: No  . Drug use: Never  . Sexual activity: Not on file  Lifestyle  . Physical activity:    Days per week: 0 days    Minutes per session: 0 min  . Stress: Not at all  Relationships  . Social connections:    Talks on phone: More than three times a week    Gets together: More than three times a week    Attends religious service: Never    Active member of club or organization: No    Attends meetings of clubs or organizations: Never    Relationship status: Married  Other Topics Concern  . Not on file  Social History Narrative  . Not on file   Tobacco  Counseling Ready to quit: Not Answered Counseling given: Not Answered   Clinical Intake:  Pre-visit preparation completed: Yes  Pain : No/denies pain     Nutritional Status: BMI > 30  Obese Nutritional Risks: None Diabetes: No  How often do you need to have someone help you when you read instructions, pamphlets, or other written materials from your doctor or pharmacy?: 1 - Never What is the last grade level you completed in school?: 11th grade  Interpreter Needed?: No  Information entered by :: Ninetta Adelstein,LPN   Activities of Daily Living In your present state of health, do you have any difficulty performing the following activities: 05/20/2018 11/14/2017  Hearing? N N  Vision? N N  Difficulty concentrating or making decisions? Tempie Donning  Comment typically comes back  -  Walking or climbing stairs? N N  Dressing or bathing? N N  Doing errands, shopping? N N  Preparing Food and eating ? N -  Using the Toilet? N -  In the past six months, have you accidently leaked urine? N -  Do you have problems with loss of bowel control? N -  Managing your Medications? N -  Managing your Finances? N -  Housekeeping or managing your Housekeeping? N -  Some recent data might be hidden     Immunizations and Health Maintenance Immunization History  Administered Date(s) Administered  . Influenza Inj Mdck Quad With Preservative 01/13/2018  . Influenza-Unspecified 12/17/2016  . Pneumococcal Polysaccharide-23 04/10/2017   Health Maintenance Due  Topic Date Due  . COLONOSCOPY  09/28/1987  . INFLUENZA VACCINE  10/24/2017    Patient Care Team: Olin Hauser, DO as PCP - General (Family Medicine)  Indicate any recent Medical Services you may have received from other than [facility] providers in the past year (date may be approximate).    Assessment:   This is a routine wellness examination for Randy [name].  Hearing/Vision screen Vision Screening Comments: No eye dr   Dietary issues  and exercise activities discussed: Current Exercise Habits: The patient does not participate in regular exercise at present, Exercise limited by: None identified  Goals   None    Depression Screen PHQ 2/9 Scores 05/20/2018 11/29/2017 11/14/2017 10/30/2017  PHQ - 2 Score 0 0 4 0  PHQ- 9 Score - - 12 -    Fall Risk Fall Risk  05/20/2018 11/29/2017 11/14/2017 10/30/2017  Falls in the past year? 0 No Yes No  Number falls in past yr: - - 1 -  Injury with Fall? - - Yes -    FALL RISK PREVENTION PERTAINING TO THE HOME:  Any stairs in or around the home? No  If so, are there any without handrails? na  Home free of loose throw rugs in walkways, pet beds, electrical cords, etc? Yes  Adequate lighting in your home to reduce risk of falls? Yes   ASSISTIVE DEVICES UTILIZED TO PREVENT FALLS:  Life alert? No  Use of a cane, walker or w/c? No  Grab bars in the bathroom? No  Shower chair or bench in shower? No  Elevated toilet seat or a handicapped toilet? No   DME ORDERS:  DME order needed?  No   TIMED UP AND GO:  Was the test performed? Yes .  Length of time to ambulate 10 feet: 11 sec.   GAIT:  Appearance of gait: Gait stead-fast without the use of an assistive device.  Education: Fall risk prevention has been discussed.  Intervention(s) required? No    Cognitive Function:     6CIT Screen 05/20/2018  What Year? 0 points  What month? 0 points  What time? 0 points  Count back from 20 0 points  Months in reverse 0 points  Repeat phrase 2 points  Total Score 2    Screening Tests Health Maintenance  Topic Date Due  . COLONOSCOPY  09/28/1987  . INFLUENZA VACCINE  10/24/2017  . HIV Screening  10/15/2018 (Originally 09/27/1984)  . TETANUS/TDAP  11/15/2018 (Originally 09/27/1988)    Qualifies for Shingles Vaccine? No  . Due for Shingrix. Education has been provided regarding the importance of this vaccine. Pt has been advised to call insurance company to determine out of pocket  expense. Advised may also receive vaccine at local pharmacy or Health Dept. Verbalized acceptance and understanding.  Tdap: Although this vaccine is not a covered service during a Wellness Exam, does the patient still wish to receive this vaccine today?  No .  Education has been provided regarding the importance of this vaccine. Advised may receive this vaccine at local pharmacy or Health Dept. Aware to provide a copy of the vaccination record if obtained from local pharmacy or Health Dept. Verbalized acceptance and understanding.  Flu Vaccine: up to date   Pneumococcal Vaccine: not indicated   Cancer Screenings:  Colorectal Screening: Completed 04/12/2017. Father had colon cancer, per result in care everywhere patient to repeat after 1 year. Patient informed to call and schedule with his GI office.    Lung Cancer Screening: (Low Dose CT Chest recommended if Age 97-80 years, 30 pack-year currently smoking OR have quit w/in 15years.) does not qualify.   .  Additional Screening:  Hepatitis C Screening: does not qualify  Vision Screening: Recommended annual ophthalmology exams for early detection of glaucoma and other disorders of the eye. Is the patient up to date with their annual eye exam?  No   If pt is not established with a provider, would they like to be referred to a provider to establish care? No . Ophthalmology referral has been placed. Pt aware the office will call re: appt.  Dental Screening: Recommended annual dental exams for proper oral hygiene  Community Resource Referral:  CRR required this visit?  No       Plan:    I have personally reviewed and addressed the Medicare Annual Wellness questionnaire and have noted the following in the patient's chart:  A. Medical and social history B. Use of alcohol, tobacco or illicit drugs  C. Current medications and supplements D. Functional ability and status E.  Nutritional status F.  Physical activity G. Advance  directives H. List of other physicians I.  Hospitalizations, surgeries, and ER visits in previous 12 months J.  [place] such as hearing and vision if needed, cognitive and depression L. Referrals and appointments   In addition, I have reviewed and discussed with patient certain preventive protocols, quality metrics, and best practice recommendations. A written personalized care plan for preventive services as well as general preventive health recommendations were provided to patient.   Signed,  Tyler Aas, LPN Nurse Health Advisor   Nurse Notes:none

## 2018-05-20 NOTE — Progress Notes (Signed)
Subjective:    Patient ID: Randy [name], male    DOB: 04-23-1970, 48 y.o.   MRN: [id number]  Randy [name] is a 48 y.o. male presenting on 05/20/2018 for COPD and PreDM   HPI   Patient has also already been seen by Tyler Aas LPN today for Annual Medicare Wellness.  Schizophrenia / Major Depression recurrent Last visit 04/23/18 Followed by [facility] for specialized Schizophrenia patients, chart reviewed from last visit, documentation shows patient has history of fetal alcohol syndrome, recurrent major depression and schizophrenia.  - Today he is reporting overall doing well - He is on invega sustenna injection now - continued on Zyprexa 5mg  overlap temporary for 2 weeks, then was DC'd in past 1-2 weeks now - Continued on Trazodone 100-200, Prozac 60mg  daily  Centrilobular Emphysema (COPD) /Former [place] tobacco abuse with smokeless  Followed by [facility] Pulmonology Dr Juanell Fairly On Ruthe Mannan, no recent flare up  Elevated A1c / Obesity BMI >36 Last lab A1c 6.1 (10/2017). Due today.  anti-psychotic/schizophrenia medication causing hyperglycemia Meds: none Lifestyle: - Diet (He is not following any particular diet, but has reduced carb / starches) - Exercise (Walking regularly for activity and exercise) Denies hypoglycemia, polyuria, visual changes, numbness or tingling.  OSA on CPAP / Insomnia - Patient reports prior history of dx OSA and on CPAP for >6 months, arranged by [facility] Pul[facility]ology, Dr Juanell Fairly, last visit 05/07/18, adjusted his CPAP pressure 5-15 range, and he has documented moderate OSA AHI 15 prior - Today reports that sleep apnea is well controlled. He uses the CPAP machine every night. Tolerates the machine fairly well sometimes difficulty breathing with it but does well with it overall - No new symptoms - He takes Trazodone 100mg  nightly with help for insomnia  Additional complaint  R 5th toe injury - accidental stubbed toe, had pain and bruising.  Health  Maintenance:  04/12/17 [facility], polyps, repeat 1 year - he has not called to schedule yet.   Depression screen [facility] 2/9 05/20/2018 05/20/2018 11/29/2017  Decreased Interest 0 0 0  Down, Depressed, Hopeless 0 0 0  PHQ - 2 Score 0 0 0  Altered sleeping 0 - -  Tired, decreased energy 1 - -  Change in appetite 1 - -  Feeling bad or failure about yourself  0 - -  Trouble concentrating 2 - -  Moving slowly or fidgety/restless 2 - -  Suicidal thoughts 0 - -  PHQ-9 Score 6 - -  Difficult doing work/chores Somewhat difficult - -    Social History   Tobacco Use  . Smoking status: Former Smoker    Packs/day: 0.00    Years: 0.00    Pack years: 0.00  . Smokeless tobacco: Current User    Types: Chew  Substance Use Topics  . Alcohol use: No  . Drug use: Never    Review of Systems Per HPI unless specifically indicated above     Objective:    BP 136/82   Pulse 91   Temp 98.7 F (37.1 C) (Oral)   Resp 16   Ht 5\' 11"  (1.803 m)   Wt 264 lb 9.6 oz (120 kg)   SpO2 99%   BMI 36.90 kg/m   Wt Readings from Last 3 Encounters:  05/20/18 264 lb 9.6 oz (120 kg)  05/20/18 264 lb 12.8 oz (120.1 kg)  05/07/18 262 lb (118.8 kg)    Physical Exam Vitals signs and nursing note reviewed.  Constitutional:  General: He is not in acute distress.    Appearance: He is well-developed. He is not diaphoretic.     Comments: Well-appearing, comfortable, cooperative, obese  HENT:     Head: Normocephalic and atraumatic.  Eyes:     General:        Right eye: No discharge.        Left eye: No discharge.     Conjunctiva/sclera: Conjunctivae normal.  Neck:     Musculoskeletal: Normal range of motion and neck supple.     Thyroid: No thyromegaly.  Cardiovascular:     Rate and Rhythm: Normal rate and regular rhythm.     Heart sounds: Normal heart sounds. No murmur.  Pulmonary:     Effort: Pulmonary effort is normal. No respiratory distress.     Breath sounds: Normal breath  sounds. No wheezing or rales.     Comments: Good air movement Musculoskeletal: Normal range of motion.  Lymphadenopathy:     Cervical: No cervical adenopathy.  Skin:    General: Skin is warm and dry.     Findings: Bruising (R 5th toe, dorsal aspect linear bruise to base of metatarsal.) present. No erythema or rash.     Comments: R 5th toe - mobile with tenderness, warm.  Neurological:     Mental Status: He is alert and oriented to person, place, and time.  Psychiatric:        Behavior: Behavior normal.     Comments: Well groomed, good eye contact, normal speech and thoughts      I have personally reviewed the radiology report from 04/12/17 [facility].  _______________________________________________________________________________ Patient Name: Randy [name]      Procedure Date: 04/12/2017 1:25 PM MRN: [id number]           Date of Birth: 07/15/1970 Admit Type: Outpatient        Age: 14 Room: [facility]     Gender: Male Note Status: Finalized        Instrument Name: [id number] _______________________________________________________________________________  Procedure:     Colonoscopy Indications:    Screening in patient at increased risk: Family history of            1st-degree relative with colorectal cancer before age 6            years Providers:     JAMA Burnis Medin, MD, Donell Sievert, [place] [employer] Referring MD:    Medicines:     Propofol per Anesthesia Complications:   No immediate complications. _______________________________________________________________________________ Procedure:     Pre-Anesthesia Assessment:           - Prior to the procedure, a History and Physical was            performed, and patient medications and allergies were            reviewed. The patient's tolerance of previous anesthesia             was also reviewed. The risks and benefits of the procedure            and the sedation options and risks were discussed with the            patient. All questions were answered, and informed consent            was obtained. Prior Anticoagulants: The patient has taken            no previous anticoagulant or antiplatelet agents. ASA  Grade Assessment: III - A patient with severe systemic            disease. After reviewing the risks and benefits, the            patient was deemed in satisfactory condition to undergo            the procedure.           After obtaining informed consent, the scope was passed            under direct vision. Throughout the procedure, the            patient's blood pressure, pulse, and oxygen saturations            were monitored continuously.The terminal ileum, ileocecal            valve, appendiceal orifice, and rectum were photographed.            The quality of the bowel preparation was adequate to            identify polyps. The Colonoscope was introduced through            the anus and advanced to the the terminal ileum, with            identification of the appendiceal orifice and IC valve.            The colonoscopy was performed without difficulty. The            patient tolerated the procedure well. The quality of the            bowel preparation was adequate to identify polyps.                                          Findings:    The terminal ileum appeared normal.    Seven sessile polyps were found in the sigmoid colon and transverse     colon. The polyps were 2 to 5 mm in size. These polyps were removed with     a cold biopsy forceps. Resection and retrieval were complete.    Four pedunculated and semi-pedunculated  polyps were found in the     recto-sigmoid colon and descending colon. The polyps were 4 to 9 mm in     size. These polyps were removed with a hot snare. Resection and     retrieval were complete.    A 5 mm polyp was found in the rectum. The polyp was semi-pedunculated.     The polyp was removed with a cold snare. Resection and retrieval were     complete.    Non-bleeding internal hemorrhoids were found during retroflexion. The     hemorrhoids were small.                                          Impression:    - The examined portion of the ileum was normal.           - Seven 2 to 5 mm polyps in the sigmoid colon and in the            transverse colon, removed with a cold biopsy forceps.            Resected and retrieved.           -  Four 4 to 9 mm polyps at the recto-sigmoid colon and in            the descending colon, removed with a hot snare. Resected            and retrieved.           - One 5 mm polyp in the rectum, removed with a cold snare.            Resected and retrieved.           - Non-bleeding internal hemorrhoids. Recommendation:  - Patient has a contact number available for emergencies.            The signs and symptoms of potential delayed complications            were discussed with the patient. Return to normal            activities tomorrow. Written discharge instructions were            provided to the patient.           - Resume previous diet.           - Continue present medications.           - Await pathology results.           - Repeat colonoscopy in 1 year for surveillance based on            number of polyps.           - Return to GI office as previously scheduled.                                           Procedure Code(s): --- Professional ---           [phone], Colonoscopy, flexible; with removal of tumor(s),            polyp(s), or other lesion(s) by snare technique           45380, 85, Colonoscopy, flexible; with biopsy, single or            multiple Diagnosis Code(s): --- Professional ---           Z80.0, Family history of malignant neoplasm of digestive            organs           K64.8, Other hemorrhoids           D12.5, Benign neoplasm of sigmoid colon           D12.3, Benign neoplasm of transverse colon (hepatic            flexure or splenic flexure)           D12.7, Benign neoplasm of rectosigmoid junction           D12.4, Benign neoplasm of descending colon           K62.1, Rectal polyp  CPT copyright 2017 American Medical Association. All rights reserved.  The codes documented in this report are preliminary and upon coder review may  be revised to meet current compliance requirements.  Electronically Signed by Rayvon Char, MD _________________________ JAMA Burnis Medin, MD 04/12/2017 3:58:01 PM The attending physician was present throughout the entire procedure including  insertion, viewing, and removal. Number of Addenda: 0  Note Initiated On: 04/12/2017 1:25 PM  Recent Labs    11/07/17 0830 05/20/18 1448  HGBA1C 6.1* 6.3*    Results for orders placed or performed in visit on 05/20/18  POCT HgB A1C  Result Value Ref Range   Hemoglobin A1C 6.3 (A) 4.0 - 5.6 %      Assessment & Plan:   Problem List Items Addressed This Visit    Centrilobular emphysema ([place])    Stable chronic problem with COPD Followed by  Pulm Dr Juanell Fairly  Continue maintenance inhaler Dulera - dose adjusted to 2 puffs BID On Albuterol PRN rarely using      Chronic recurrent major depressive disorder (HCC)    Stable chronic depression, complicated  by Schizophrenia Followed by [place] clinic On Invega injectable, Fluoxetine, Trazodone      Relevant Medications   FLUoxetine HCl 60 MG TABS   Elevated hemoglobin A1c - Primary    Mild increased A1c up to 6.3, at risk of future T2DM Secondary to metabolic effects of 2nd gen antipsychotic meds most likely and lifestyle combination Defer medications at this time. Future may consider Metformin vs GLP1 Encourage improve lifestyle, exercise - Again reviewed Handout given glycemic index, low carb diet options Follow-up q 6 month annual physical      Relevant Orders   POCT HgB A1C (Completed)   Obesity (BMI 35.0-39.9 without comorbidity)    Weight stable Keep improving diet lifestyle      OSA on CPAP    Well controlled, chronic OSA on CPAP - Good adherence to CPAP nightly - Continue current CPAP therapy, patient seems to be benefiting from therapy       Schizophrenia ([place])    Chronic Schizophrenia, relatively stable Followed by [place] clinic On Invega injectable, Fluoxetine, Trazodone       Other Visit Diagnoses    Contusion of fifth toe of right foot, initial encounter        Reassurance, likely localized sprain vs possible mild non displaced toe fracture  Firm sole shoe, avoid excessive ambulation wt bearing on it as it heals Follow-up PRN - may check x-ray if significant worsening  No orders of the defined types were placed in this encounter.    Follow up plan: Return in about 6 months (around 11/18/2018) for Annual Physical.  Future labs ordered for 10/2018  Future would recommend referral to Chronic Case Management  [name], DO [facility] 05/20/2018, 2:37 PM

## 2018-05-21 ENCOUNTER — Ambulatory Visit
Admit: 2018-05-21 | Discharge: 2018-05-22 | Payer: MEDICARE | Attending: Student in an Organized Health Care Education/Training Program | Primary: Student in an Organized Health Care Education/Training Program

## 2018-05-21 ENCOUNTER — Other Ambulatory Visit: Payer: Self-pay | Admitting: Family Medicine

## 2018-05-21 ENCOUNTER — Encounter: Payer: Self-pay | Admitting: Family Medicine

## 2018-05-21 DIAGNOSIS — F339 Major depressive disorder, recurrent, unspecified: Secondary | ICD-10-CM | POA: Diagnosis not present

## 2018-05-21 DIAGNOSIS — K2100 Gastro-esophageal reflux disease with esophagitis, without bleeding: Secondary | ICD-10-CM

## 2018-05-21 DIAGNOSIS — G4733 Obstructive sleep apnea (adult) (pediatric): Secondary | ICD-10-CM

## 2018-05-21 DIAGNOSIS — F209 Schizophrenia, unspecified: Secondary | ICD-10-CM

## 2018-05-21 DIAGNOSIS — Z79899 Other long term (current) drug therapy: Secondary | ICD-10-CM | POA: Diagnosis not present

## 2018-05-21 DIAGNOSIS — R69 Illness, unspecified: Secondary | ICD-10-CM | POA: Diagnosis not present

## 2018-05-21 DIAGNOSIS — Z0000 Encounter for general adult medical examination without abnormal findings: Secondary | ICD-10-CM

## 2018-05-21 DIAGNOSIS — Z9989 Dependence on other enabling machines and devices: Secondary | ICD-10-CM

## 2018-05-21 DIAGNOSIS — R7309 Other abnormal glucose: Secondary | ICD-10-CM

## 2018-05-21 DIAGNOSIS — E669 Obesity, unspecified: Secondary | ICD-10-CM

## 2018-05-21 DIAGNOSIS — J432 Centrilobular emphysema: Secondary | ICD-10-CM

## 2018-05-21 DIAGNOSIS — E782 Mixed hyperlipidemia: Secondary | ICD-10-CM

## 2018-05-21 DIAGNOSIS — G4700 Insomnia, unspecified: Secondary | ICD-10-CM | POA: Diagnosis not present

## 2018-05-21 NOTE — Assessment & Plan Note (Signed)
Mild increased A1c up to 6.3, at risk of future T2DM Secondary to metabolic effects of 2nd gen antipsychotic meds most likely and lifestyle combination Defer medications at this time. Future may consider Metformin vs GLP1 Encourage improve lifestyle, exercise - Again reviewed Handout given glycemic index, low carb diet options Follow-up q 6 month annual physical

## 2018-05-21 NOTE — Assessment & Plan Note (Signed)
Weight stable Keep improving diet lifestyle 

## 2018-05-21 NOTE — Assessment & Plan Note (Signed)
Well controlled, chronic OSA on CPAP - Good adherence to CPAP nightly - Continue current CPAP therapy, patient seems to be benefiting from therapy  

## 2018-05-21 NOTE — Assessment & Plan Note (Signed)
Stable chronic depression, complicated by Schizophrenia Followed by [facility] On Invega injectable, Fluoxetine, Trazodone 

## 2018-05-21 NOTE — Assessment & Plan Note (Addendum)
Chronic Schizophrenia, relatively stable Followed by [place] [facility] On Invega injectable, Fluoxetine, Trazodone

## 2018-05-21 NOTE — Assessment & Plan Note (Signed)
Stable chronic problem with COPD Followed by  Pul[facility] Dr Juanell Fairly  Continue maintenance inhaler Ruthe Mannan - dose adjusted to 2 puffs BID On Albuterol PRN rarely using

## 2018-05-30 ENCOUNTER — Ambulatory Visit
Admit: 2018-05-30 | Discharge: 2018-05-31 | Payer: MEDICARE | Attending: Physician Assistant | Primary: Physician Assistant

## 2018-05-30 DIAGNOSIS — K029 Dental caries, unspecified: Secondary | ICD-10-CM | POA: Diagnosis not present

## 2018-05-30 DIAGNOSIS — K0889 Other specified disorders of teeth and supporting structures: Secondary | ICD-10-CM | POA: Diagnosis not present

## 2018-05-30 DIAGNOSIS — J449 Chronic obstructive pulmonary disease, unspecified: Principal | ICD-10-CM

## 2018-05-30 DIAGNOSIS — C8590 Non-Hodgkin lymphoma, unspecified, unspecified site: Principal | ICD-10-CM

## 2018-05-30 DIAGNOSIS — Q860 Fetal alcohol syndrome (dysmorphic): Principal | ICD-10-CM

## 2018-05-30 DIAGNOSIS — F209 Schizophrenia, unspecified: Principal | ICD-10-CM

## 2018-05-30 DIAGNOSIS — F319 Bipolar disorder, unspecified: Principal | ICD-10-CM

## 2018-05-30 MED ORDER — PENICILLIN V POTASSIUM 500 MG TABLET
ORAL_TABLET | Freq: Three times a day (TID) | ORAL | 0 refills | 0 days | Status: CP
Start: 2018-05-30 — End: 2018-06-09

## 2018-06-20 ENCOUNTER — Other Ambulatory Visit: Payer: Self-pay | Admitting: Family Medicine

## 2018-06-20 DIAGNOSIS — F209 Schizophrenia, unspecified: Secondary | ICD-10-CM

## 2018-06-20 DIAGNOSIS — R7303 Prediabetes: Secondary | ICD-10-CM

## 2018-06-20 DIAGNOSIS — E669 Obesity, unspecified: Secondary | ICD-10-CM

## 2018-06-23 ENCOUNTER — Ambulatory Visit: Payer: Self-pay | Admitting: Pharmacist

## 2018-06-23 DIAGNOSIS — R7303 Prediabetes: Secondary | ICD-10-CM

## 2018-06-23 DIAGNOSIS — F209 Schizophrenia, unspecified: Secondary | ICD-10-CM

## 2018-06-23 NOTE — Progress Notes (Signed)
  Care Management Note   Randy [name] is a 48 y.o. year old male who is a primary care patient of Olin Hauser, DO. The CM team was consulted for assistance with chronic disease management and care coordination.   I reached out to [name] by phone today. Randy [name] reports that he is currently in the grocery store and asks that I call him back in 1 hour.   Follow Up Plan: Will call Randy [name] later this afternoon as planned.   [name], PharmD, [place] [employer] [phone]

## 2018-06-23 NOTE — Progress Notes (Signed)
  Care Management Note   Randy [name] is a 48 y.o. year old male who is a primary care patient of Olin Hauser, DO. The CM team was consulted for assistance with chronic disease management and care coordination.   I reached out to [name] again by phone as requested by patient. Patient does not answer. Left a HIPAA compliant message on the patient's voicemail.   Follow Up Plan: The CM team will reach out to the patient again over the next 4-5 days.    [name], PharmD, [place] [employer] [phone]

## 2018-06-25 ENCOUNTER — Ambulatory Visit (INDEPENDENT_AMBULATORY_CARE_PROVIDER_SITE_OTHER): Payer: Medicare HMO | Admitting: Pharmacist

## 2018-06-25 DIAGNOSIS — R7303 Prediabetes: Secondary | ICD-10-CM

## 2018-06-25 DIAGNOSIS — F209 Schizophrenia, unspecified: Secondary | ICD-10-CM

## 2018-06-25 DIAGNOSIS — E782 Mixed hyperlipidemia: Secondary | ICD-10-CM

## 2018-06-25 NOTE — Patient Instructions (Signed)
Visit Information  Goals Addressed   None     Mr. Routh was given information about Chronic Care Management services today including:  1. CCM service includes personalized support from designated clinical staff supervised by his physician, including individualized plan of care and coordination with other care providers 2. 24/7 contact phone numbers for assistance for urgent and routine care needs. 3. Service will only be billed when office clinical staff spend 20 minutes or more in a month to coordinate care. 4. Only one practitioner may furnish and bill the service in a calendar month. 5. The patient may stop CCM services at any time (effective at the end of the month) by phone call to the office staff. 6. The patient will be responsible for cost sharing (co-pay) of up to 20% of the service fee (after annual deductible is met).  Patient agreed to services and verbal consent obtained.   The patient verbalized understanding of instructions provided today and declined a print copy of patient instruction materials.   The CM team will reach out to the patient again over the next 8 days.   [name], PharmD, [place] [employer] [phone]

## 2018-06-25 NOTE — Chronic Care Management (AMB) (Signed)
  Care Management Note   Randy [name] is a 48 y.o. year old male who is a primary care patient of Olin Hauser, DO. The CM team was consulted for assistance with chronic disease management and care coordination.   Subjective  I reached out to Randy [name] by phone today.   Randy [name] was given information about Chronic Care Management services today including:  1. CCM service includes personalized support from designated clinical staff supervised by his physician, including individualized plan of care and coordination with other care providers 2. 24/7 contact phone numbers for assistance for urgent and routine care needs. 3. Service will only be billed when office clinical staff spend 20 minutes or more in a month to coordinate care. 4. Only one practitioner may furnish and bill the service in a calendar month. 5. The patient may stop CCM services at any time (effective at the end of the month) by phone call to the office staff. 6. The patient will be responsible for cost sharing (co-pay) of up to 20% of the service fee (after annual deductible is met). Patient agreed to services and verbal consent obtained.    Randy [name] reports that he lives with his grandson, Randy [name], and his daughter.   Perform medication review with patient. Patient reports that he receives his Kirt Boys injection once monthly administered by his mental health provider at [facility], next injection scheduled for 07/16/2018. Patient denies any barriers with getting to these appointments at this time.  Patient denies any medication questions/concerns at this time.  Counsel patient about COVID-19 prevention, including handwashing and social distancing.  Patient expresses interest in speaking with our CM Nurse Care Manager regarding diabetes prevention. Begin discussion with patient regarding general diabetes prevention recommendations, when patient's daughter Randy [name] comes on the phone line to express concern  about who I am and the purpose of my phone call. Identify myself to patient's daughter. Daughter states that she wants me to be aware of the patient's mental health conditions and states that he frequently lies to healthcare provider about a variety of things, including his diet and daily routine. When [name] the phone back to Mr. Randy [name], the phone is disconnected. I call the patient back and leave a HIPAA compliant message on his voicemail, providing my direct number again.  Receive an incoming call from patient's ex-wife, Randy, who also expresses concern about who I am and the purpose of my call to the patient. Identify myself to Randy and let her know the purpose of my call. Randy also states that she wants to make me aware of the patient's mental health conditions and expresses concern that the patient will not provide accurate information to healthcare providers, particularly over the phone.  Note that there is a designated party release in patient's chart for both Randy [name] (ex-wife) and Randy [name] (daughter).  Place a coordination of care call to [place] Worker [place].   Review of patient status, including review of consultants reports, relevant laboratory and other test results, and collaboration with appropriate care team members and the patient's provider was performed as part of comprehensive patient evaluation and provision of chronic care management services.   Follow Up Plan: The CM Social Worker will reach out to the patient on 07/03/2018.   Randy [name], PharmD, [place] [employer] [phone]

## 2018-07-01 MED ORDER — OLANZAPINE 5 MG TABLET
ORAL_TABLET | Freq: Every evening | ORAL | 0 refills | 0.00000 days | Status: CP
Start: 2018-07-01 — End: 2018-07-14

## 2018-07-10 ENCOUNTER — Ambulatory Visit: Payer: Self-pay | Admitting: Licensed Clinical Social Worker

## 2018-07-10 ENCOUNTER — Ambulatory Visit: Payer: Self-pay

## 2018-07-10 NOTE — Chronic Care Management (AMB) (Signed)
  Care Management Note   Randy [name] is a 48 y.o. year old male who is a primary care patient of Olin Hauser, DO. The CM team was consulted for assistance with chronic disease management and care coordination.   I reached out to Randy [name] by phone today but was unable to reach patient or spouse. LCSW left a HIPPA compliant voice message encouraging family to return call once available.   Review of patient status, including review of consultants reports, relevant laboratory and other test results, and collaboration with appropriate care team members and the patient's provider was performed as part of comprehensive patient evaluation and provision of chronic care management services.   Follow Up Plan: The CM team will reach out to the patient again over the next 14 days.   Eula Fried, BSW, MSW, [facility] Care Management [email or web address] Phone: [phone]

## 2018-07-17 ENCOUNTER — Other Ambulatory Visit: Payer: Self-pay

## 2018-07-17 ENCOUNTER — Ambulatory Visit: Payer: Medicare HMO | Admitting: Licensed Clinical Social Worker

## 2018-07-17 DIAGNOSIS — F209 Schizophrenia, unspecified: Secondary | ICD-10-CM

## 2018-07-17 NOTE — Chronic Care Management (AMB) (Signed)
  Chronic Care Management    Clinical Social Work General Note  07/17/2018 Name: Randy [name] MRN: [id number] DOB: [birth year]  Randy [name] is a 48 y.o. year old male who is a primary care patient of Olin Hauser, DO. The CCM was consulted to assist the patient and LCSW was able to reach patient by phone today. HIPPA verifications were successfully received. Patient reports that his ex wife Randy [name] and daughter Randy [name] are not available or with him at this time to discuss his care.   Review of patient status, including review of consultants reports, relevant laboratory and other test results, and collaboration with appropriate care team members and the patient's provider was performed as part of comprehensive patient evaluation and provision of chronic care management services.    SDOH (Social Determinants of Health) screening performed today. See Care Plan Entry related to challenges with: Financial Strain   Goals Addressed    . "I want to take better care of my self" (pt-stated)       Current Barriers:  . Financial constraints . Limited social support . Mental Health Concerns  . Limited education about self care management * . Cognitive Deficits  Clinical Social Work Clinical Goal(s):  [name] Over the next 90 days, client will work with SW to address concerns related to mental health  . Over the next 90 days, patient will work with LCSW to address needs related to lack of self care . Over the next 90 days, client will follow up with RNCM in regards to diabetes education* as directed by SW  Interventions: . Patient interviewed and appropriate assessments performed . Provided mental health counseling with regard to implementing appropriate self care methods to combat symptoms of depression/anxiety/stress/boredom . Discussed plans with patient for ongoing care management follow up and provided patient with direct contact information for care management team . Collaborated with RN  Case Manager re: diabetes education . Assisted patient/caregiver with obtaining information about health plan benefits  Patient Self Care Activities:  . Attends all scheduled provider appointments . Calls provider office for new concerns or questions  Initial goal documentation   Follow Up Plan: SW will follow up with patient by phone over the next 2-4 weeks      Eula Fried, [place], MSW, [email or web address] Phone: [phone]

## 2018-07-17 NOTE — Patient Instructions (Addendum)
Licensed Clinical Social Worker Visit Information  Goals we discussed today:  Goals Addressed    . "I want to take better care of my self" (pt-stated)       Current Barriers:  . Financial constraints . Limited social support . Mental Health Concerns  . Limited education about self care management * . Cognitive Deficits  Clinical Social Work Clinical Goal(s):  [name] Over the next 90 days, client will work with SW to address concerns related to mental health  . Over the next 90 days, patient will work with LCSW to address needs related to lack of self care . Over the next 90 days, client will follow up with RNCM in regards to diabetes education* as directed by SW  Interventions: . Patient interviewed and appropriate assessments performed . Provided mental health counseling with regard to implementing appropriate self care methods to combat symptoms of depression/anxiety/stress/boredom . Discussed plans with patient for ongoing care management follow up and provided patient with direct contact information for care management team . Collaborated with RN Case Manager re: diabetes education . Assisted patient/caregiver with obtaining information about health plan benefits  Patient Self Care Activities:  . Attends all scheduled provider appointments . Calls provider office for new concerns or questions  Initial goal documentation     Materials provided: Verbal education about self care management provided by phone  The patient verbalized understanding of instructions provided today and declined a print copy of patient instruction materials.   Follow up plan: SW will follow up with patient by phone over the next 2-4 weeks  Eula Fried, [place], MSW, [email or web address] Phone: [phone]

## 2018-07-23 ENCOUNTER — Institutional Professional Consult (permissible substitution): Admit: 2018-07-23 | Discharge: 2018-07-24 | Payer: MEDICARE | Attending: Psychiatry | Primary: Psychiatry

## 2018-07-23 DIAGNOSIS — R69 Illness, unspecified: Secondary | ICD-10-CM | POA: Diagnosis not present

## 2018-07-23 DIAGNOSIS — F209 Schizophrenia, unspecified: Principal | ICD-10-CM

## 2018-07-24 ENCOUNTER — Ambulatory Visit: Payer: Self-pay | Admitting: Licensed Clinical Social Worker

## 2018-07-24 NOTE — Chronic Care Management (AMB) (Signed)
  Care Management Note   Randy [name] is a 48 y.o. year old male who is a primary care patient of Olin Hauser, DO. The CM team was consulted for assistance with chronic disease management and care coordination. LCSW completed outreach to patient's residence today on 07/24/2018 and left a HIPPA compliant voice message encouraging family to return call once available.   Follow Up Plan: A HIPPA compliant phone message was left for the patient providing contact information and requesting a return call. LCSW will complete an additional outreach attempt if needed.   Eula Fried, BSW, MSW, [facility] Care Management [email or web address] Phone: [phone]

## 2018-07-28 MED ORDER — OLANZAPINE 5 MG TABLET
ORAL_TABLET | Freq: Every evening | ORAL | 1 refills | 0.00000 days | Status: CP
Start: 2018-07-28 — End: 2018-09-02

## 2018-07-31 ENCOUNTER — Ambulatory Visit: Payer: Self-pay | Admitting: Licensed Clinical Social Worker

## 2018-07-31 ENCOUNTER — Telehealth: Payer: Self-pay

## 2018-07-31 NOTE — Chronic Care Management (AMB) (Signed)
  Chronic Care Management    Clinical Social Work CCM Outreach Note  07/31/2018 Name: Randy [name] MRN: [id number] DOB: 14-May-1970  Randy [name] is a 48 y.o. year old male who is a primary care patient of Olin Hauser, DO . The CCM team was consulted for assistance with [name].   LCSW reached out to [name] today by phone but was unable to reach patient or family successfully. HIPPA compliant voice message left encouraging family to return call once available if interested in CCM involvement.  Follow Up Plan: Client will follow up with CCM team if any case management needs arise or if he is interested in social work support  Eula Fried, [employer], MSW, [email or web address] Phone: [phone]

## 2018-08-13 ENCOUNTER — Institutional Professional Consult (permissible substitution)
Admit: 2018-08-13 | Discharge: 2018-08-14 | Payer: MEDICARE | Attending: Student in an Organized Health Care Education/Training Program | Primary: Student in an Organized Health Care Education/Training Program

## 2018-08-13 DIAGNOSIS — Z79899 Other long term (current) drug therapy: Secondary | ICD-10-CM

## 2018-08-13 DIAGNOSIS — G4700 Insomnia, unspecified: Secondary | ICD-10-CM

## 2018-08-13 DIAGNOSIS — F209 Schizophrenia, unspecified: Principal | ICD-10-CM

## 2018-08-13 DIAGNOSIS — F3340 Major depressive disorder, recurrent, in remission, unspecified: Secondary | ICD-10-CM

## 2018-08-13 MED ORDER — FLUOXETINE 20 MG CAPSULE
ORAL_CAPSULE | Freq: Every day | ORAL | 3 refills | 0.00000 days | Status: CP
Start: 2018-08-13 — End: 2018-10-20

## 2018-08-13 MED ORDER — TRAZODONE 100 MG TABLET
ORAL_TABLET | 3 refills | 0 days | Status: CP
Start: 2018-08-13 — End: 2018-10-20

## 2018-08-20 MED ORDER — INVEGA SUSTENNA 234 MG/1.5 ML INTRAMUSCULAR SYRINGE
INTRAMUSCULAR | 5 refills | 0 days | Status: CP
Start: 2018-08-20 — End: 2018-10-20

## 2018-08-21 ENCOUNTER — Telehealth: Payer: Self-pay

## 2018-09-01 DIAGNOSIS — R69 Illness, unspecified: Secondary | ICD-10-CM | POA: Diagnosis not present

## 2018-09-02 MED ORDER — OLANZAPINE 5 MG TABLET
ORAL_TABLET | Freq: Every evening | ORAL | 1 refills | 0 days | Status: CP
Start: 2018-09-02 — End: 2018-10-20

## 2018-09-16 ENCOUNTER — Institutional Professional Consult (permissible substitution): Admit: 2018-09-16 | Discharge: 2018-09-17 | Payer: MEDICARE | Attending: Psychiatry | Primary: Psychiatry

## 2018-09-16 DIAGNOSIS — R69 Illness, unspecified: Secondary | ICD-10-CM | POA: Diagnosis not present

## 2018-09-16 DIAGNOSIS — F209 Schizophrenia, unspecified: Principal | ICD-10-CM

## 2018-09-29 DIAGNOSIS — R69 Illness, unspecified: Secondary | ICD-10-CM | POA: Diagnosis not present

## 2018-10-14 ENCOUNTER — Ambulatory Visit
Admission: RE | Admit: 2018-10-14 | Discharge: 2018-10-14 | Disposition: A | Discharge status: Home / Self Care | Payer: Medicare HMO | Source: Ambulatory Visit | Attending: Family Medicine | Admitting: Family Medicine

## 2018-10-14 ENCOUNTER — Encounter: Payer: Self-pay | Admitting: Family Medicine

## 2018-10-14 ENCOUNTER — Other Ambulatory Visit: Payer: Self-pay

## 2018-10-14 ENCOUNTER — Ambulatory Visit (INDEPENDENT_AMBULATORY_CARE_PROVIDER_SITE_OTHER): Payer: Medicare HMO | Admitting: Family Medicine

## 2018-10-14 VITALS — BP 134/78 | HR 95 | Temp 98.9°F | Resp 16 | Ht 71.0 in | Wt 257.0 lb

## 2018-10-14 DIAGNOSIS — M79602 Pain in left arm: Secondary | ICD-10-CM

## 2018-10-14 DIAGNOSIS — M25522 Pain in left elbow: Secondary | ICD-10-CM | POA: Insufficient documentation

## 2018-10-14 DIAGNOSIS — S52125A Nondisplaced fracture of head of left radius, initial encounter for closed fracture: Secondary | ICD-10-CM

## 2018-10-14 DIAGNOSIS — M25532 Pain in left wrist: Secondary | ICD-10-CM

## 2018-10-14 DIAGNOSIS — W19XXXA Unspecified fall, initial encounter: Secondary | ICD-10-CM | POA: Diagnosis not present

## 2018-10-14 DIAGNOSIS — S6992XA Unspecified injury of left wrist, hand and finger(s), initial encounter: Secondary | ICD-10-CM | POA: Diagnosis not present

## 2018-10-14 NOTE — Patient Instructions (Addendum)
Thank you for coming to the office today.  X-rays of Left Arm ordered  [facility] (off of [address])  [place],  [place]  [id number] [address]: [phone]  I am worried about injury to arm, can be muscle injury or possibly broken bone.  Recommend to start taking Tylenol Extra Strength 500mg  tabs - take 1 to 2 tabs per dose (max 1000mg ) every 6-8 hours for pain (take regularly, don't skip a dose for next 7 days), max 24 hour daily dose is 6 tablets or 3000mg . In the future you can repeat the same everyday Tylenol course for 1-2 weeks at a time.   Recommend a THUMB SPICA SPLINT for WRIST to support it  Can keep arm protected at side, may not need a sling.  Relative rest, avoid re-injury  Please schedule a Follow-up Appointment to: Return in about 1 week (around 10/21/2018), or if symptoms worsen or fail to improve, for left arm pain.  If you have any other questions or concerns, please feel free to call the office or send a message through [place]. You may also schedule an earlier appointment if necessary.  Additionally, you may be receiving a survey about your experience at our office within a few days to 1 week by e-mail or mail. We value your feedback.  [name], DO [place]

## 2018-10-14 NOTE — Progress Notes (Signed)
Subjective:    Patient ID: Randy [name], male    DOB: [birth year], 48 y.o.   MRN: [id number]  Randy [name] is a 48 y.o. male presenting on 10/14/2018 for Fall (onset 3 weeks left arm pain getting worst)   HPI   LEFT UPPER EXTREMITY PAIN / FALL Reports new problem onset 3 weeks ago on 09/27/18 running outside playing with family member and he fell on his outstretched left arm, he says behind himself, landed on his palm and hand, pain in wrist and elbow region of arm. He thought bruised or injured, tried to care for it at home, no splint or support, took Tylenol PRN, pain was worsening not improving. Interferes with some activities, now has some numbness in hand most of hand, seems fairly persistent. He has not had injury to this arm before. Not followed by orthopedic - Asking about x-ray today Denies any redness, swelling, other joint injury, nausea vomiting, fever chills  Depression screen [facility] 2/9 10/14/2018 05/20/2018 05/20/2018  Decreased Interest 0 0 0  Down, Depressed, Hopeless 0 0 0  PHQ - 2 Score 0 0 0  Altered sleeping - 0 -  Tired, decreased energy - 1 -  Change in appetite - 1 -  Feeling bad or failure about yourself  - 0 -  Trouble concentrating - 2 -  Moving slowly or fidgety/restless - 2 -  Suicidal thoughts - 0 -  PHQ-9 Score - 6 -  Difficult doing work/chores - Somewhat difficult -    Social History   Tobacco Use  . Smoking status: Former Smoker    Packs/day: 0.00    Years: 0.00    Pack years: 0.00  . Smokeless tobacco: Current User    Types: Chew  Substance Use Topics  . Alcohol use: No  . Drug use: Never    Review of Systems Per HPI unless specifically indicated above     Objective:    BP 134/78   Pulse 95   Temp 98.9 F (37.2 C) (Oral)   Resp 16   Ht 5\' 11"  (1.803 m)   Wt 257 lb (116.6 kg)   BMI 35.84 kg/m   Wt Readings from Last 3 Encounters:  10/14/18 257 lb (116.6 kg)  05/20/18 264 lb 9.6 oz (120 kg)  05/20/18 264 lb 12.8 oz (120.1 kg)    Physical Exam Vitals signs and nursing note reviewed.  Constitutional:      General: He is not in acute distress.    Appearance: He is well-developed. He is not diaphoretic.     Comments: Well-appearing, comfortable, cooperative  HENT:     Head: Normocephalic and atraumatic.  Eyes:     General:        Right eye: No discharge.        Left eye: No discharge.     Conjunctiva/sclera: Conjunctivae normal.  Cardiovascular:     Rate and Rhythm: Normal rate.  Pulmonary:     Effort: Pulmonary effort is normal.  Musculoskeletal:     Comments: Left Upper Extremity  Pain at left wrist non focal. Pain with wrist ext flex. And grip but strength intact, distal sensation to light touch reduced.  Forearm tender to palpation over proximal radius region, nontender over olecranon, he has good ROM of elbow flex ext, has good shoulder motion above shoulder flex/abduction.  Pain with supination and pronation of forearm.  Carrying arm at his side  Skin:    General: Skin is warm and dry.  Findings: No erythema or rash.  Neurological:     Mental Status: He is alert and oriented to person, place, and time.  Psychiatric:        Behavior: Behavior normal.     Comments: Well groomed, good eye contact, normal speech and thoughts      I have personally reviewed the radiology report from 10/14/18 STAT Left Upper Extremity X-rays.  CLINICAL DATA:  48 year old who fell on his outstretched LEFT hand and wrist approximately 3 weeks ago with persistent LEFT wrist pain, LEFT elbow pain and LEFT hand numbness. Initial imaging encounter.  EXAM: LEFT ELBOW - COMPLETE 3+ VIEW  COMPARISON:  None.  FINDINGS: Nondisplaced mildly impacted fracture involving the radial head. No convincing evidence of extension to the articular surface. No other fractures. Well preserved joint spaces. Well-preserved bone mineral density. POSTERIOR fat pad visible, indicating a joint effusion or hemarthrosis.   IMPRESSION: Acute/subacute traumatic nondisplaced mildly impacted fracture involving the radial head.  These results will be called to the ordering clinician or representative by the Radiologist Assistant, and communication documented in the PACS or zVision Dashboard.   Electronically Signed   By: Evangeline Dakin M.D.   On: 10/14/2018 16:29  --------------------------------------------  CLINICAL DATA:  48 year old who fell on his outstretched LEFT hand and wrist approximately 3 weeks ago with persistent LEFT wrist pain, LEFT elbow pain and LEFT hand numbness. Initial imaging encounter.  EXAM: LEFT WRIST - COMPLETE 3+ VIEW  COMPARISON:  None.  FINDINGS: No evidence of acute fracture or dislocation. Joint spaces well preserved. Well-preserved bone mineral density. No intrinsic osseous abnormalities.  IMPRESSION: Normal examination.   Electronically Signed   By: Evangeline Dakin M.D.   On: 10/14/2018 16:24   Results for orders placed or performed in visit on 05/20/18  POCT HgB A1C  Result Value Ref Range   Hemoglobin A1C 6.3 (A) 4.0 - 5.6 %      Assessment & Plan:   Problem List Items Addressed This Visit    None    Visit Diagnoses    Acute pain of left wrist    -  Primary   Relevant Orders   DG Wrist Complete Left (Completed)   Left arm pain       Relevant Orders   DG Forearm Left (Completed)   Fall, initial encounter       Left elbow pain       Relevant Orders   DG Elbow Complete Left (Completed)      Subacute persistent Left arm pain, difficult to localize to wrist vs forearm vs elbow, does not have acute bony tenderness in wrist or elbow. Difficulty with some pain reproduced on ROM on arm Suspect possible fracture from [facility]  Ordered STAT X-rays L wrist, forearm, elbow - [facility]  He has since completed these x-rays, STAT result was called to me, I reviewed it and called patient after 5pm today.  Diagnosis now of subacute traumatic  fracture of proximal radial head, non displaced but it is impacted based on x-ray interpretation from radiologist.  Advised he needs orthopedic involvement due to severity of pain and numbness in hand.  Recommend that instead of referral, he needs immediate relief - I advised he go to [facility] hours, gave them info and address, they will go now, open til 730pm tonight, I called [facility] and gave them case info, they may have to repeat x-ray fore radial head fracture, and will treat appropriately.   No orders of  the defined types were placed in this encounter.   Follow up plan: Return in about 1 week (around 10/21/2018), or if symptoms worsen or fail to improve, for left arm pain.   [name], [facility] 10/14/2018, 3:02 PM

## 2018-10-20 ENCOUNTER — Institutional Professional Consult (permissible substitution)
Admit: 2018-10-20 | Discharge: 2018-10-21 | Payer: MEDICARE | Attending: Student in an Organized Health Care Education/Training Program | Primary: Student in an Organized Health Care Education/Training Program

## 2018-10-20 DIAGNOSIS — R69 Illness, unspecified: Secondary | ICD-10-CM | POA: Diagnosis not present

## 2018-10-20 DIAGNOSIS — F330 Major depressive disorder, recurrent, mild: Secondary | ICD-10-CM | POA: Diagnosis not present

## 2018-10-20 DIAGNOSIS — G4700 Insomnia, unspecified: Secondary | ICD-10-CM | POA: Diagnosis not present

## 2018-10-20 MED ORDER — OLANZAPINE 10 MG DISINTEGRATING TABLET
ORAL_TABLET | Freq: Every evening | ORAL | 0 refills | 16.00000 days | Status: CP
Start: 2018-10-20 — End: 2018-11-05

## 2018-10-20 MED ORDER — TRAZODONE 100 MG TABLET
ORAL_TABLET | 3 refills | 0 days | Status: CP
Start: 2018-10-20 — End: ?

## 2018-10-20 MED ORDER — FLUOXETINE 20 MG CAPSULE
ORAL_CAPSULE | Freq: Every day | ORAL | 3 refills | 30 days | Status: CP
Start: 2018-10-20 — End: 2019-02-17

## 2018-10-28 ENCOUNTER — Institutional Professional Consult (permissible substitution): Admit: 2018-10-28 | Discharge: 2018-10-29 | Payer: MEDICARE

## 2018-10-28 DIAGNOSIS — R69 Illness, unspecified: Secondary | ICD-10-CM | POA: Diagnosis not present

## 2018-10-30 DIAGNOSIS — S52125A Nondisplaced fracture of head of left radius, initial encounter for closed fracture: Secondary | ICD-10-CM | POA: Diagnosis not present

## 2018-11-25 ENCOUNTER — Institutional Professional Consult (permissible substitution): Admit: 2018-11-25 | Discharge: 2018-11-26 | Payer: MEDICARE | Attending: Psychiatry | Primary: Psychiatry

## 2018-11-25 DIAGNOSIS — R69 Illness, unspecified: Secondary | ICD-10-CM | POA: Diagnosis not present

## 2018-11-25 DIAGNOSIS — Z1331 Encounter for screening for depression: Secondary | ICD-10-CM

## 2018-12-03 DIAGNOSIS — R69 Illness, unspecified: Secondary | ICD-10-CM | POA: Diagnosis not present

## 2018-12-05 DIAGNOSIS — R69 Illness, unspecified: Secondary | ICD-10-CM | POA: Diagnosis not present

## 2018-12-23 MED ORDER — OLANZAPINE 10 MG DISINTEGRATING TABLET
ORAL_TABLET | Freq: Every evening | ORAL | 0 refills | 16 days | Status: CP
Start: 2018-12-23 — End: 2019-01-06

## 2018-12-29 DIAGNOSIS — R69 Illness, unspecified: Secondary | ICD-10-CM | POA: Diagnosis not present

## 2019-01-06 MED ORDER — TRAZODONE 100 MG TABLET: tablet | 3 refills | 0 days | Status: AC

## 2019-01-06 MED ORDER — PALIPERIDONE ER 6 MG TABLET,EXTENDED RELEASE 24 HR: 6 mg | tablet | Freq: Every morning | 3 refills | 30 days | Status: AC

## 2019-01-06 MED ORDER — FLUOXETINE 20 MG CAPSULE: 60 mg | capsule | Freq: Every day | 3 refills | 30 days | Status: AC

## 2019-01-16 MED ORDER — OLANZAPINE 10 MG TABLET: 10 mg | tablet | Freq: Every evening | 3 refills | 30 days | Status: AC

## 2019-01-26 ENCOUNTER — Telehealth
Admit: 2019-01-26 | Discharge: 2019-01-27 | Payer: MEDICARE | Attending: Student in an Organized Health Care Education/Training Program | Primary: Student in an Organized Health Care Education/Training Program

## 2019-01-26 DIAGNOSIS — F324 Major depressive disorder, single episode, in partial remission: Principal | ICD-10-CM

## 2019-01-26 DIAGNOSIS — F209 Schizophrenia, unspecified: Principal | ICD-10-CM

## 2019-01-26 DIAGNOSIS — Z79899 Other long term (current) drug therapy: Principal | ICD-10-CM

## 2019-01-26 MED ORDER — OLANZAPINE 10 MG TABLET
ORAL_TABLET | Freq: Every evening | ORAL | 3 refills | 30 days | Status: CP
Start: 2019-01-26 — End: 2019-05-26

## 2019-01-26 MED ORDER — TRAZODONE 100 MG TABLET
ORAL_TABLET | 3 refills | 0 days | Status: CP
Start: 2019-01-26 — End: ?

## 2019-01-26 MED ORDER — FLUOXETINE 40 MG CAPSULE
ORAL_CAPSULE | Freq: Every day | ORAL | 3 refills | 30 days | Status: CP
Start: 2019-01-26 — End: 2019-05-26

## 2019-01-28 DIAGNOSIS — R69 Illness, unspecified: Secondary | ICD-10-CM | POA: Diagnosis not present

## 2019-03-31 DIAGNOSIS — R69 Illness, unspecified: Secondary | ICD-10-CM | POA: Diagnosis not present

## 2019-04-05 DIAGNOSIS — E782 Mixed hyperlipidemia: Secondary | ICD-10-CM

## 2019-04-06 ENCOUNTER — Telehealth
Admit: 2019-04-06 | Discharge: 2019-04-07 | Payer: MEDICARE | Attending: Student in an Organized Health Care Education/Training Program | Primary: Student in an Organized Health Care Education/Training Program

## 2019-04-06 DIAGNOSIS — Z79899 Other long term (current) drug therapy: Principal | ICD-10-CM

## 2019-04-06 MED ORDER — ATORVASTATIN CALCIUM 20 MG PO TABS: 20.0000 mg | ORAL_TABLET | Freq: Every day | ORAL | 1 refills | Status: DC

## 2019-04-06 MED ORDER — DIVALPROEX ER 500 MG TABLET,EXTENDED RELEASE 24 HR
ORAL_TABLET | Freq: Every evening | ORAL | 0 refills | 30.00000 days | Status: CP
Start: 2019-04-06 — End: 2019-05-06

## 2019-04-30 DIAGNOSIS — F250 Schizoaffective disorder, bipolar type: Principal | ICD-10-CM

## 2019-04-30 MED ORDER — INVEGA SUSTENNA 234 MG/1.5 ML INTRAMUSCULAR SYRINGE
INJECTION | 0 refills | 0 days | Status: CP
Start: 2019-04-30 — End: ?

## 2019-05-04 DIAGNOSIS — R69 Illness, unspecified: Secondary | ICD-10-CM | POA: Diagnosis not present

## 2019-05-06 ENCOUNTER — Other Ambulatory Visit: Admit: 2019-05-06 | Discharge: 2019-05-07 | Payer: MEDICARE

## 2019-05-06 DIAGNOSIS — Z79899 Other long term (current) drug therapy: Secondary | ICD-10-CM | POA: Diagnosis not present

## 2019-06-01 ENCOUNTER — Telehealth
Admit: 2019-06-01 | Discharge: 2019-06-02 | Payer: MEDICARE | Attending: Student in an Organized Health Care Education/Training Program | Primary: Student in an Organized Health Care Education/Training Program

## 2019-06-03 MED ORDER — OLANZAPINE 10 MG TABLET
ORAL_TABLET | Freq: Every evening | ORAL | 0 refills | 90 days | Status: CP
Start: 2019-06-03 — End: 2019-09-01

## 2019-06-03 MED ORDER — TRAZODONE 100 MG TABLET
ORAL_TABLET | 3 refills | 0 days | Status: CP
Start: 2019-06-03 — End: ?

## 2019-06-03 MED ORDER — DOXEPIN 3 MG TABLET
ORAL_TABLET | Freq: Every evening | ORAL | 0 refills | 30.00000 days | Status: CP
Start: 2019-06-03 — End: 2019-07-03

## 2019-06-08 MED ORDER — OLANZAPINE 10 MG TABLET
ORAL_TABLET | Freq: Every evening | ORAL | 0 refills | 90 days | Status: CP
Start: 2019-06-08 — End: 2019-09-06

## 2019-06-08 MED ORDER — ATORVASTATIN 20 MG TABLET
ORAL_TABLET | ORAL | 1 refills | 0 days | Status: CP
Start: 2019-06-08 — End: 2020-06-07

## 2019-06-08 MED ORDER — DIVALPROEX ER 500 MG TABLET,EXTENDED RELEASE 24 HR
ORAL_TABLET | Freq: Every evening | ORAL | 0 refills | 30 days | Status: CP
Start: 2019-06-08 — End: 2019-07-08

## 2019-06-08 MED ORDER — FLUTICASONE PROPIONATE 50 MCG/ACTUATION NASAL SPRAY,SUSPENSION
2 refills | 0 days | Status: CP
Start: 2019-06-08 — End: 2020-06-07

## 2019-06-08 MED ORDER — ALBUTEROL SULFATE HFA 90 MCG/ACTUATION AEROSOL INHALER
RESPIRATORY_TRACT | 2 refills | 0 days | Status: CP
Start: 2019-06-08 — End: 2020-06-07

## 2019-06-08 MED ORDER — TRAZODONE 100 MG TABLET
ORAL_TABLET | 3 refills | 0 days | Status: CP
Start: 2019-06-08 — End: ?

## 2019-06-08 MED ORDER — DOXEPIN 10 MG CAPSULE
ORAL_CAPSULE | Freq: Every evening | ORAL | 0 refills | 30.00000 days | Status: CP
Start: 2019-06-08 — End: 2019-07-08

## 2019-06-15 MED ORDER — DIVALPROEX ER 500 MG TABLET,EXTENDED RELEASE 24 HR
ORAL_TABLET | Freq: Every evening | ORAL | 0 refills | 90 days | Status: CP
Start: 2019-06-15 — End: 2019-09-13

## 2019-06-29 DIAGNOSIS — R69 Illness, unspecified: Secondary | ICD-10-CM | POA: Diagnosis not present

## 2019-07-01 ENCOUNTER — Other Ambulatory Visit: Payer: Self-pay | Admitting: Family Medicine

## 2019-07-01 DIAGNOSIS — K219 Gastro-esophageal reflux disease without esophagitis: Secondary | ICD-10-CM

## 2019-07-06 MED ORDER — DOXEPIN 10 MG CAPSULE
ORAL_CAPSULE | 1 refills | 0 days | Status: CP
Start: 2019-07-06 — End: ?

## 2019-07-28 DIAGNOSIS — R69 Illness, unspecified: Secondary | ICD-10-CM | POA: Diagnosis not present

## 2019-11-02 MED ORDER — OLANZAPINE 20 MG TABLET
ORAL_TABLET | 0 refills | 0 days
Start: 2019-11-02 — End: ?

## 2019-11-02 MED ORDER — DIVALPROEX ER 500 MG TABLET,EXTENDED RELEASE 24 HR
ORAL_TABLET | Freq: Every evening | ORAL | 0 refills | 90.00000 days | Status: CP
Start: 2019-11-02 — End: 2020-01-31

## 2019-11-02 MED ORDER — ATORVASTATIN 20 MG TABLET
ORAL_TABLET | ORAL | 1 refills | 0 days
Start: 2019-11-02 — End: 2020-11-01

## 2019-11-02 MED ORDER — DOXEPIN 10 MG CAPSULE
ORAL_CAPSULE | Freq: Every evening | ORAL | 1 refills | 90 days | Status: CP | PRN
Start: 2019-11-02 — End: ?

## 2019-11-02 MED ORDER — OLANZAPINE 10 MG TABLET
ORAL_TABLET | Freq: Every evening | ORAL | 0 refills | 90 days | Status: CP
Start: 2019-11-02 — End: 2020-01-31

## 2019-11-03 MED ORDER — OLANZAPINE 10 MG TABLET
ORAL_TABLET | Freq: Every evening | ORAL | 0 refills | 30 days | Status: CP
Start: 2019-11-03 — End: 2019-12-03

## 2019-11-04 ENCOUNTER — Other Ambulatory Visit: Payer: Self-pay

## 2019-11-04 ENCOUNTER — Encounter: Payer: Self-pay | Admitting: Family Medicine

## 2019-11-04 ENCOUNTER — Ambulatory Visit (INDEPENDENT_AMBULATORY_CARE_PROVIDER_SITE_OTHER): Payer: Medicare HMO | Admitting: Family Medicine

## 2019-11-04 VITALS — BP 127/80 | HR 102 | Temp 98.6°F | Resp 16 | Ht 71.0 in | Wt 265.0 lb

## 2019-11-04 DIAGNOSIS — R69 Illness, unspecified: Secondary | ICD-10-CM | POA: Diagnosis not present

## 2019-11-04 DIAGNOSIS — K219 Gastro-esophageal reflux disease without esophagitis: Secondary | ICD-10-CM | POA: Diagnosis not present

## 2019-11-04 DIAGNOSIS — E669 Obesity, unspecified: Secondary | ICD-10-CM | POA: Diagnosis not present

## 2019-11-04 DIAGNOSIS — F339 Major depressive disorder, recurrent, unspecified: Secondary | ICD-10-CM | POA: Diagnosis not present

## 2019-11-04 DIAGNOSIS — F200 Paranoid schizophrenia: Secondary | ICD-10-CM

## 2019-11-04 DIAGNOSIS — G4733 Obstructive sleep apnea (adult) (pediatric): Secondary | ICD-10-CM | POA: Diagnosis not present

## 2019-11-04 DIAGNOSIS — J432 Centrilobular emphysema: Secondary | ICD-10-CM | POA: Diagnosis not present

## 2019-11-04 DIAGNOSIS — R7303 Prediabetes: Secondary | ICD-10-CM | POA: Diagnosis not present

## 2019-11-04 DIAGNOSIS — Z1159 Encounter for screening for other viral diseases: Secondary | ICD-10-CM

## 2019-11-04 DIAGNOSIS — E782 Mixed hyperlipidemia: Secondary | ICD-10-CM

## 2019-11-04 DIAGNOSIS — Z125 Encounter for screening for malignant neoplasm of prostate: Secondary | ICD-10-CM

## 2019-11-04 DIAGNOSIS — Z79899 Other long term (current) drug therapy: Secondary | ICD-10-CM

## 2019-11-04 MED ORDER — OMEPRAZOLE 20 MG PO CPDR: 20.0000 mg | DELAYED_RELEASE_CAPSULE | Freq: Every day | ORAL | 1 refills | Status: DC

## 2019-11-04 MED ORDER — ALBUTEROL SULFATE HFA 108 (90 BASE) MCG/ACT IN AERS: 1.0000 | INHALATION_SPRAY | Freq: Four times a day (QID) | RESPIRATORY_TRACT | 3 refills | Status: DC | PRN

## 2019-11-04 MED ORDER — ATORVASTATIN CALCIUM 20 MG PO TABS: 20.0000 mg | ORAL_TABLET | Freq: Every day | ORAL | 1 refills | Status: DC

## 2019-11-04 MED ORDER — ALBUTEROL SULFATE HFA 90 MCG/ACTUATION AEROSOL INHALER
RESPIRATORY_TRACT | 0 days
Start: 2019-11-04 — End: ?

## 2019-11-04 NOTE — Progress Notes (Signed)
Subjective:    Patient ID: [name], male    DOB: [birth year], 49 y.o.   MRN: [id number]  Randy [name] is a 49 y.o. male presenting on 11/04/2019 for Schizophrenia  Accompanied by ex-wife Randy [name]  HPI    Schizophrenia / Major Depression recurrent Still paranoia concerns, he may go to [facility] clinic for PCP in future Followed by [facility] for specialized Schizophrenia patients, chart reviewedfrom last visit,documentation shows patient has history of fetal alcohol syndrome, recurrent major depression and schizophrenia.  - Today he is reporting overall doing well Continues on medications Invega sustenna, zyprexa - Continued on Trazodone 100-200, Prozac daily  Centrilobular Emphysema (COPD) /Former [place] tobacco abuse with smokeless Previously followed by Pulm Off dulera and maintenance Using albuterol PRN Interested to see [facility] specialist  Elevated A1c/ Obesity BMI >36 Last lab A1c 6.3 (2020). Due today. Or return for lab  anti-psychotic/schizophrenia medication causing hyperglycemia Meds:none Lifestyle: - Diet (He is not following any particular diet, but has reduced carb / starches) - Exercise (Walking regularly for activity and exercise) Denies hypoglycemia, polyuria, visual changes, numbness or tingling.  OSA / Insomnia Stopped using CPAP, said insurance wouldn't pay for it. Patient reports prior history of dx OSA and on CPAP for >6 months, arranged by [facility] Pulmonology, Randy [name], last visit 05/07/18, adjusted his CPAP pressure 5-15 range, and he has documented moderate OSA AHI 15 prior Today asking for new sleep doctor - He takes Trazodone 100mg  nightly with help for insomnia   Health Maintenance:  04/12/17 [facility], repeat 1 year - he has not called to schedule yet.   Depression screen [facility] 2/9 11/04/2019 10/14/2018 05/20/2018  Decreased Interest 0 0 0  Down, Depressed, Hopeless 2 0 0  PHQ - 2 Score 2 0 0  Altered  sleeping 0 - 0  Tired, decreased energy 1 - 1  Change in appetite 2 - 1  Feeling bad or failure about yourself  0 - 0  Trouble concentrating 0 - 2  Moving slowly or fidgety/restless 2 - 2  Suicidal thoughts 0 - 0  PHQ-9 Score 7 - 6  Difficult doing work/chores Somewhat difficult - Somewhat difficult   GAD 7 : Generalized Anxiety Score 11/04/2019 11/14/2017  Nervous, Anxious, on Edge 2 1  Control/stop worrying 1 2  Worry too much - different things 1 1  Trouble relaxing 0 2  Restless 1 2  Easily annoyed or irritable 3 2  Afraid - awful might happen 1 1  Total GAD 7 Score 9 11  Anxiety Difficulty Somewhat difficult Not difficult at all     Social History   Tobacco Use  . Smoking status: Former Smoker    Packs/day: 0.00    Years: 0.00    Pack years: 0.00  . Smokeless tobacco: Current User    Types: Chew  Vaping Use  . Vaping Use: Never used  Substance Use Topics  . Alcohol use: No  . Drug use: Never    Review of Systems Per HPI unless specifically indicated above     Objective:    BP 127/80   Pulse (!) 102   Temp 98.6 F (37 C) (Temporal)   Resp 16   Ht 5\' 11"  (1.803 m)   Wt 265 lb (120.2 kg)   SpO2 98%   BMI 36.96 kg/m   Wt Readings from Last 3 Encounters:  11/04/19 265 lb (120.2 kg)  10/14/18 257 lb (116.6 kg)  05/20/18 264  lb 9.6 oz (120 kg)    Physical Exam Vitals and nursing note reviewed.  Constitutional:      General: He is not in acute distress.    Appearance: He is well-developed. He is not diaphoretic.     Comments: Well-appearing, comfortable, cooperative  HENT:     Head: Normocephalic and atraumatic.  Eyes:     General:        Right eye: No discharge.        Left eye: No discharge.     Conjunctiva/sclera: Conjunctivae normal.  Neck:     Thyroid: No thyromegaly.  Cardiovascular:     Rate and Rhythm: Normal rate and regular rhythm.     Heart sounds: Normal heart sounds. No murmur heard.   Pulmonary:     Effort: Pulmonary effort is  normal. No respiratory distress.     Breath sounds: Normal breath sounds. No wheezing or rales.  Musculoskeletal:        General: Normal range of motion.     Cervical back: Normal range of motion and neck supple.  Lymphadenopathy:     Cervical: No cervical adenopathy.  Skin:    General: Skin is warm and dry.     Findings: No erythema or rash.  Neurological:     Mental Status: He is alert and oriented to person, place, and time.  Psychiatric:        Behavior: Behavior normal.     Comments: Well groomed, good eye contact, normal speech and thoughts. He is at baseline mental health. He does have some poor or reduced insight into his health. Does lack some poor judgement when discussing tobacco and other health topics such as adherence to CPAP        Results for orders placed or performed in visit on 05/20/18  POCT HgB A1C  Result Value Ref Range   Hemoglobin A1C 6.3 (A) 4.0 - 5.6 %      Assessment & Plan:   Problem List Items Addressed This Visit    Schizophrenia ([place]) - Primary    Chronic Schizophrenia, relatively stable Followed by [place] [facility] - new provider apt coming up On Invega injectable, Fluoxetine, Trazodone      Relevant Orders   CBC with Differential/Platelet   COMPLETE METABOLIC PANEL WITH GFR   Pre-diabetes    Overdue for A1c Prior elevated A1c to 6.3 in 2020 Now will get labs tomorrow including A1c Counseling on diet exercise lifestyle and secondary affects of some of his medications can increase risk of hyperglycemia / wt gain  Future consider therapy based on A1c as indicated Metformin vs GLP1      Relevant Orders   Hemoglobin A1c   OSA (obstructive sleep apnea)    No longer adhering to CPAP Prior study years ago, now requesting repeat PSG to [facility] sleep center for further management OSA, will need updated PSG for new CPAP if indicated  Refer to [facility] however this was changed to [facility] Neuro notified that they do sleep medicine       Relevant Orders   Ambulatory referral to Neurology   Obesity (BMI 35.0-39.9 without comorbidity)    Weight stable Keep improving diet lifestyle      Relevant Orders   COMPLETE METABOLIC PANEL WITH GFR   Hyperlipidemia   Relevant Medications   atorvastatin (LIPITOR) 20 MG tablet   Other Relevant Orders   Lipid panel   TSH   T4, free   Chronic recurrent major depressive disorder (  [place])    Stable chronic depression, complicated by Schizophrenia Followed by [place] clinic On Invega injectable, Fluoxetine, Trazodone      Relevant Medications   doxepin (SINEQUAN) 10 MG capsule   Centrilobular emphysema (HCC)   Relevant Medications   albuterol (PROAIR HFA) 108 (90 Base) MCG/ACT inhaler    Other Visit Diagnoses    Gastroesophageal reflux disease       Relevant Medications   omeprazole (PRILOSEC) 20 MG capsule   Long-term use of high-risk medication       Relevant Orders   Hemoglobin A1c   CBC with Differential/Platelet   COMPLETE METABOLIC PANEL WITH GFR   Lipid panel   TSH   Screening for prostate cancer       Relevant Orders   PSA   Need for hepatitis C screening test       Relevant Orders   Hepatitis C antibody      #Centrilobular Emphysema No longer on maintenance, but seems controlled Previuosly with Pulmonologist Difficulty with adherence and cost in past with inhalers, declines at this time. Interested to see [facility] specialist for this in future Refill Albuterol PRN Counseling on nicotine dependence, limit chewing tobacco     Orders Placed This Encounter  Procedures  . Hemoglobin A1c  . CBC with Differential/Platelet  . COMPLETE METABOLIC PANEL WITH GFR  . Lipid panel    Order Specific Question:   Has the patient fasted?    Answer:   Yes  . PSA  . Hepatitis C antibody  . TSH  . T4, free  . Ambulatory referral to Neurology    Referral Priority:   Routine    Referral Type:   Consultation    Referral Reason:   Specialty Services Required     Requested Specialty:   Neurology    Number of Visits Requested:   1     Meds ordered this encounter  Medications  . atorvastatin (LIPITOR) 20 MG tablet    Sig: Take 1 tablet (20 mg total) by mouth daily.    Dispense:  90 tablet    Refill:  1  . omeprazole (PRILOSEC) 20 MG capsule    Sig: Take 1 capsule (20 mg total) by mouth daily before breakfast.    Dispense:  90 capsule    Refill:  1  . albuterol (PROAIR HFA) 108 (90 Base) MCG/ACT inhaler    Sig: Inhale 1-2 puffs into the lungs every 6 (six) hours as needed for wheezing or shortness of breath.    Dispense:  1 g    Refill:  3     Follow up plan: Return in about 6 months (around 05/06/2020) for 6 month .  Future labs ordered for tomorrow 11/05/19 released   [name], [facility] 11/04/2019, 2:13 PM

## 2019-11-04 NOTE — Patient Instructions (Addendum)
Thank you for coming to the office today.  Sleep Study repeat may need to be done. If not using CPAP will not be covered  REFERRAL to [facility] for Sleep Apnea Follow up on this to check status  Refilled atorvastatin, omeprazole and albuterol  --------------------------------  Follow-up with [facility] on Colonoscopy, had polyps last time they advised repeat in 1 year, it was done in 2019 at [facility].  DUE for FASTING BLOOD WORK (no food or drink after midnight before the lab appointment, only water or coffee without cream/sugar on the morning of)  SCHEDULE "Lab Only" visit in the morning at the clinic for lab draw in 1-3 days   Please schedule a Follow-up Appointment to: Return in about 6 months (around 05/06/2020) for 6 month .  If you have any other questions or concerns, please feel free to call the office or send a message through [place]. You may also schedule an earlier appointment if necessary.  Additionally, you may be receiving a survey about your experience at our office within a few days to 1 week by e-mail or mail. We value your feedback.  [name], DO [place]

## 2019-11-05 ENCOUNTER — Other Ambulatory Visit: Payer: Medicare HMO

## 2019-11-05 NOTE — Assessment & Plan Note (Signed)
Overdue for A1c Prior elevated A1c to 6.3 in 2020 Now will get labs tomorrow including A1c Counseling on diet exercise lifestyle and secondary affects of some of his medications can increase risk of hyperglycemia / wt gain  Future consider therapy based on A1c as indicated Metformin vs GLP1

## 2019-11-05 NOTE — Assessment & Plan Note (Signed)
Stable chronic depression, complicated by Schizophrenia Followed by [place] clinic On Invega injectable, Fluoxetine, Trazodone

## 2019-11-05 NOTE — Assessment & Plan Note (Signed)
Chronic Schizophrenia, relatively stable Followed by [place] clinic - new provider apt coming up On Invega injectable, Fluoxetine, Trazodone

## 2019-11-05 NOTE — Assessment & Plan Note (Signed)
Weight stable Keep improving diet lifestyle

## 2019-11-05 NOTE — Assessment & Plan Note (Signed)
No longer adhering to CPAP Prior study years ago, now requesting repeat PSG to [facility] sleep center for further management OSA, will need updated PSG for new CPAP if indicated  Refer to [facility] however this was changed to [facility] Neuro notified that they do sleep medicine

## 2019-11-06 ENCOUNTER — Other Ambulatory Visit: Payer: Medicare HMO

## 2019-11-06 DIAGNOSIS — G4733 Obstructive sleep apnea (adult) (pediatric): Principal | ICD-10-CM

## 2019-11-16 ENCOUNTER — Ambulatory Visit
Admit: 2019-11-16 | Discharge: 2019-11-17 | Payer: MEDICARE | Attending: Student in an Organized Health Care Education/Training Program | Primary: Student in an Organized Health Care Education/Training Program

## 2019-11-16 DIAGNOSIS — Z79899 Other long term (current) drug therapy: Secondary | ICD-10-CM | POA: Diagnosis not present

## 2019-11-16 DIAGNOSIS — R69 Illness, unspecified: Secondary | ICD-10-CM | POA: Diagnosis not present

## 2019-11-16 DIAGNOSIS — F250 Schizoaffective disorder, bipolar type: Principal | ICD-10-CM

## 2019-11-16 MED ORDER — DOXEPIN 10 MG CAPSULE
ORAL_CAPSULE | Freq: Every evening | ORAL | 1 refills | 90 days | Status: CP | PRN
Start: 2019-11-16 — End: ?

## 2019-11-16 MED ORDER — OLANZAPINE 10 MG TABLET
ORAL_TABLET | Freq: Every evening | ORAL | 1 refills | 30.00000 days | Status: CP
Start: 2019-11-16 — End: 2019-12-16

## 2019-11-23 ENCOUNTER — Other Ambulatory Visit: Payer: Self-pay | Admitting: Family Medicine

## 2019-11-23 DIAGNOSIS — K219 Gastro-esophageal reflux disease without esophagitis: Secondary | ICD-10-CM

## 2019-11-23 MED ORDER — OMEPRAZOLE 20 MG CAPSULE,DELAYED RELEASE
0 days
Start: 2019-11-23 — End: ?

## 2019-11-23 NOTE — Telephone Encounter (Signed)
Original Rx sent to [facility]- patient requesting Randy [name]- remainder of Rx sent to new pharmacy

## 2019-11-28 DIAGNOSIS — E782 Mixed hyperlipidemia: Secondary | ICD-10-CM

## 2019-11-29 DIAGNOSIS — R69 Illness, unspecified: Secondary | ICD-10-CM | POA: Diagnosis not present

## 2019-12-09 MED ORDER — FLUOXETINE 20 MG TABLET
ORAL_TABLET | ORAL | 0 refills | 0 days
Start: 2019-12-09 — End: ?

## 2019-12-22 MED ORDER — ATORVASTATIN CALCIUM 20 MG PO TABS: 20.0000 mg | ORAL_TABLET | Freq: Every day | ORAL | 1 refills | Status: DC

## 2019-12-22 NOTE — Addendum Note (Signed)
Addended by: Olin Hauser on: 12/22/2019 06:49 PM   Modules accepted: Orders

## 2019-12-23 MED ORDER — DOXEPIN 10 MG CAPSULE
ORAL_CAPSULE | Freq: Every evening | ORAL | 1 refills | 45 days | Status: CP
Start: 2019-12-23 — End: ?

## 2019-12-23 MED ORDER — FLUOXETINE 20 MG CAPSULE
ORAL_CAPSULE | Freq: Every day | ORAL | 1 refills | 90 days | Status: CP
Start: 2019-12-23 — End: 2020-12-22

## 2019-12-23 MED ORDER — OLANZAPINE 10 MG TABLET
ORAL_TABLET | Freq: Every evening | ORAL | 1 refills | 30 days | Status: CP
Start: 2019-12-23 — End: 2020-01-22

## 2019-12-23 MED ORDER — DIVALPROEX ER 500 MG TABLET,EXTENDED RELEASE 24 HR
ORAL_TABLET | Freq: Every evening | ORAL | 0 refills | 90 days | Status: CP
Start: 2019-12-23 — End: 2020-03-22

## 2019-12-28 ENCOUNTER — Ambulatory Visit (INDEPENDENT_AMBULATORY_CARE_PROVIDER_SITE_OTHER): Payer: Medicare HMO | Admitting: Family Medicine

## 2019-12-28 ENCOUNTER — Ambulatory Visit
Admission: RE | Admit: 2019-12-28 | Discharge: 2019-12-28 | Disposition: A | Discharge status: Home / Self Care | Payer: Medicare HMO | Source: Ambulatory Visit | Attending: Family Medicine | Admitting: Family Medicine

## 2019-12-28 ENCOUNTER — Encounter: Payer: Self-pay | Admitting: Family Medicine

## 2019-12-28 ENCOUNTER — Other Ambulatory Visit: Payer: Self-pay

## 2019-12-28 ENCOUNTER — Ambulatory Visit
Admission: RE | Admit: 2019-12-28 | Discharge: 2019-12-28 | Disposition: A | Discharge status: Home / Self Care | Payer: Medicare HMO | Attending: Family Medicine | Admitting: Family Medicine

## 2019-12-28 VITALS — BP 132/80 | HR 109 | Temp 97.4°F | Resp 16 | Ht 71.0 in | Wt 271.6 lb

## 2019-12-28 DIAGNOSIS — Z79899 Other long term (current) drug therapy: Secondary | ICD-10-CM | POA: Diagnosis not present

## 2019-12-28 DIAGNOSIS — S83411A Sprain of medial collateral ligament of right knee, initial encounter: Secondary | ICD-10-CM | POA: Diagnosis not present

## 2019-12-28 DIAGNOSIS — F200 Paranoid schizophrenia: Secondary | ICD-10-CM

## 2019-12-28 DIAGNOSIS — E785 Hyperlipidemia, unspecified: Secondary | ICD-10-CM

## 2019-12-28 DIAGNOSIS — M25561 Pain in right knee: Secondary | ICD-10-CM

## 2019-12-28 DIAGNOSIS — F339 Major depressive disorder, recurrent, unspecified: Secondary | ICD-10-CM | POA: Diagnosis not present

## 2019-12-28 DIAGNOSIS — E1169 Type 2 diabetes mellitus with other specified complication: Secondary | ICD-10-CM

## 2019-12-28 DIAGNOSIS — E782 Mixed hyperlipidemia: Secondary | ICD-10-CM | POA: Diagnosis not present

## 2019-12-28 DIAGNOSIS — R69 Illness, unspecified: Secondary | ICD-10-CM | POA: Diagnosis not present

## 2019-12-28 MED ORDER — METFORMIN HCL ER 500 MG PO TB24: 500.0000 mg | ORAL_TABLET | Freq: Every day | ORAL | 1 refills | Status: DC

## 2019-12-28 MED ORDER — METFORMIN ER 500 MG TABLET,EXTENDED RELEASE 24 HR
0 days
Start: 2019-12-28 — End: ?

## 2019-12-28 NOTE — Progress Notes (Signed)
Subjective:    Patient ID: Randy [name], male    DOB: 11-15-1970, 49 y.o.   MRN: [id number]  Randy [name] is a 49 y.o. male presenting on 12/28/2019 for Knee Pain (onset 2 weeks getting worst from past week)   HPI   Schizophrenia / Major Depression recurrent Followed by [facility] for specialized Schizophrenia patients, chart reviewedfrom last visit,documentation shows patient has history of fetal alcohol syndrome, recurrent major depression and schizophrenia.  - Today he is reporting overall doing well, there was some discussion about adjusting his medications recently based on chart review. - he had lab for Depakote level checked 05/06/19 (31.8 result), and then again on 11/16/19 result 60.0 - they are asking today for repeat depakote level sent to [facility]. - He has had other labs as well - He is on med management per Psychiatry Depakote, Olanzapine, Fluoxetine, Doxepin Failed trazodone  Type 2 Diabetes - new diagnosis/Morbid Obesity BMI >37 Last lab A1c 6.3 (2020). Due today. Or return for lab anti-psychotic/schizophrenia medication causing hyperglycemia Meds:none Lifestyle: - Diet (He is not following any particular diet,but has reduced carb / starches) - Exercise (Walking regularly for activity and exercise) Denies hypoglycemia, polyuria, visual changes, numbness or tingling  Right Knee Pain Recent knee injury twist/fall down some stairs, 2 weeks ago with some redness and swollen after with pain, felt like "bone on bone" - took Ibuprofen 200mg  x 4, few times day PRN, not helpful. - Uses a First Aid ACE wrap - Admits still pain on medial aspect of knee, no prior knee injury - Denies bruising swelling redness, other joint pain   Health Maintenance: UTD Flu vaccine UTD COVID vaccine  Depression screen [facility] 2/9 11/04/2019 10/14/2018 05/20/2018  Decreased Interest 0 0 0  Down, Depressed, Hopeless 2 0 0  PHQ - 2 Score 2 0 0  Altered sleeping 0 - 0  Tired, decreased  energy 1 - 1  Change in appetite 2 - 1  Feeling bad or failure about yourself  0 - 0  Trouble concentrating 0 - 2  Moving slowly or fidgety/restless 2 - 2  Suicidal thoughts 0 - 0  PHQ-9 Score 7 - 6  Difficult doing work/chores Somewhat difficult - Somewhat difficult    Social History   Tobacco Use  . Smoking status: Former Smoker    Packs/day: 0.00    Years: 0.00    Pack years: 0.00  . Smokeless tobacco: Former Systems developer    Types: Secondary school teacher  . Vaping Use: Never used  Substance Use Topics  . Alcohol use: No  . Drug use: Never    Review of Systems Per HPI unless specifically indicated above     Objective:    BP 132/80   Pulse (!) 109   Temp (!) 97.4 F (36.3 C) (Temporal)   Resp 16   Ht 5\' 11"  (1.803 m)   Wt 271 lb 9.6 oz (123.2 kg)   SpO2 98%   BMI 37.88 kg/m   Wt Readings from Last 3 Encounters:  12/28/19 271 lb 9.6 oz (123.2 kg)  11/04/19 265 lb (120.2 kg)  10/14/18 257 lb (116.6 kg)    Physical Exam Vitals and nursing note reviewed.  Constitutional:      General: He is not in acute distress.    Appearance: He is well-developed. He is obese. He is not diaphoretic.     Comments: Well-appearing, comfortable, cooperative  HENT:     Head: Normocephalic and atraumatic.  Eyes:     General:        Right eye: No discharge.        Left eye: No discharge.     Conjunctiva/sclera: Conjunctivae normal.  Cardiovascular:     Rate and Rhythm: Normal rate.  Pulmonary:     Effort: Pulmonary effort is normal.  Musculoskeletal:     Right lower leg: No edema.     Left lower leg: No edema.     Comments: Right Knee Inspection: Normal appearance and symmetrical. No ecchymosis or effusion. Palpation: Moderate +TTP R knee only medial joint line. Fine crepitus ROM: Full active ROM bilaterally but slightly reduced due to pain R side Special Testing: Lachman / Valgus/Varus tests negative with intact ligaments except some pain without any laxity of MCL. Strength: 5/5  intact knee flex/ext, ankle dorsi/plantarflex Neurovascular: distally intact sensation light touch and pulses   Skin:    General: Skin is warm and dry.     Findings: No erythema or rash.  Neurological:     Mental Status: He is alert and oriented to person, place, and time.  Psychiatric:        Behavior: Behavior normal.     Comments: Well groomed, good eye contact, normal speech and thoughts      Diabetic Foot Exam - Simple   Simple Foot Form Diabetic Foot exam was performed with the following findings: Yes 12/28/2019 10:27 AM  Visual Inspection No deformities, no ulcerations, no other skin breakdown bilaterally: Yes Sensation Testing Intact to touch and monofilament testing bilaterally: Yes Pulse Check Posterior Tibialis and Dorsalis pulse intact bilaterally: Yes Comments      Results for orders placed or performed in visit on 05/20/18  POCT HgB A1C  Result Value Ref Range   Hemoglobin A1C 6.3 (A) 4.0 - 5.6 %   I have personally reviewed the radiology report from Right Knee.  CLINICAL DATA:  Fall, pain.  EXAM: RIGHT KNEE - COMPLETE 4+ VIEW  COMPARISON:  None.  FINDINGS: No evidence of fracture, dislocation, or joint effusion. No evidence of arthropathy or other focal bone abnormality. Soft tissues are unremarkable.  IMPRESSION: No acute osseous abnormality.   Electronically Signed   By: Primitivo Gauze M.D.   On: 12/28/2019 09:30     Assessment & Plan:   Problem List Items Addressed This Visit    Type 2 diabetes mellitus with other specified complication ([place])    New diagnosis Type 2 Diabetes, A1c >7 on repeat readings Last A1c 6 weeks ago per [facility], not due for repeat Complications - hyperlipidemia, GERD, depression, obesity, OSA -  increases risk of future cardiovascular complications   Plan:  1. START Metformin XR 500mg  daily 2. Encourage improved lifestyle - low carb, low sugar diet, reduce portion size, continue improving regular  exercise 3. Check CBG , bring log to next visit for review 4. Continue Statin, future consider ACEi/ARB low dose or check urine microalbumin      Relevant Medications   metFORMIN (GLUCOPHAGE-XR) 500 MG 24 hr tablet   Schizophrenia (HCC) - Primary    Chronic Schizophrenia, relatively stable Followed by [place] clinic On med management per Psych  Will check Depakote, CMET CBC by request and fax to [facility] Dr Deneise Lever once available for lab interpretation      Relevant Orders   Valproic Acid level   COMPLETE METABOLIC PANEL WITH GFR   CBC with Differential/Platelet   Morbid obesity ([place])    Morbid obesity  with BMI >37, with type 2 diabetes, GERD, OSA, Hyperlipidemia Encourage to keep losing weight improve lifestyle diet exercise      Relevant Medications   metFORMIN (GLUCOPHAGE-XR) 500 MG 24 hr tablet   Hyperlipidemia associated with type 2 diabetes mellitus ([place])    Prior elevated lipids Last checked 6 weeks ago per [facility] Will not repeat today On statin      Relevant Medications   metFORMIN (GLUCOPHAGE-XR) 500 MG 24 hr tablet   Chronic recurrent major depressive disorder (HCC)    Stable chronic depression, complicated by Schizophrenia Followed by [place] clinic On medication management per Psych      Relevant Medications   doxepin (SINEQUAN) 10 MG capsule   FLUoxetine (PROZAC) 20 MG capsule    Other Visit Diagnoses    Acute pain of right knee       Sprain of medial collateral ligament of right knee, initial encounter       Relevant Orders   DG Knee Complete 4 Views Right (Completed)   Long-term use of high-risk medication       Relevant Orders   Valproic Acid level   COMPLETE METABOLIC PANEL WITH GFR   CBC with Differential/Platelet     #Right Knee Pain, Acute / suspected ligament MCL sprain  Acute R medial knee pain without swelling following new fall injury Unknown if prior knee OA/DJD Concern for meniscus vs ligament injury given  acute twisting injury mechanism - but exam is less supportive - Able to bear weight, no mechanical locking - No prior history of knee surgery, arthroscopy  Plan: 1. Right knee X-ray today, routine- follow-up results on mychart - UPDATE - X-ray results reviewed and on chart now, after visit. No acute knee injury no significant arthritis DJD seen.  2. Start OTC Voltaren topical diclofenac 2-4 times a day if prefer instead of oral NSAID 3. Start Tylenol 500-1000mg  per dose TID PRN breakthrough 4. Future trial muscle relaxant if needed 5. Knee brace recommended -  RICE therapy (rest, ice, compression, elevation) for swelling, activity modification  Follow-up 2 - 4 weeks, if still worsening, consider steroid injection and referral to Ortho for further eval, may need MRI if concern remains for meniscus / ligament injury    Meds ordered this encounter  Medications  . metFORMIN (GLUCOPHAGE-XR) 500 MG 24 hr tablet    Sig: Take 1 tablet (500 mg total) by mouth daily with breakfast.    Dispense:  90 tablet    Refill:  1      Follow up plan: Return for keep already scheduled apt.  Forward test results to Dr Deneise Lever at [place], CBC, CMET   [name], [facility] 12/28/2019, 8:38 AM

## 2019-12-28 NOTE — Assessment & Plan Note (Addendum)
New diagnosis Type 2 Diabetes, A1c >7 on repeat readings Last A1c 6 weeks ago per [facility], not due for repeat Complications - hyperlipidemia, GERD, depression, obesity, OSA -  increases risk of future cardiovascular complications   Plan:  1. START Metformin XR 500mg  daily 2. Encourage improved lifestyle - low carb, low sugar diet, reduce portion size, continue improving regular exercise 3. Check CBG , bring log to next visit for review 4. Continue Statin, future consider ACEi/ARB low dose or check urine microalbumin

## 2019-12-28 NOTE — Assessment & Plan Note (Signed)
Chronic Schizophrenia, relatively stable Followed by [place] clinic On med management per Psych  Will check Depakote, CMET CBC by request and fax to [facility] Dr Deneise Lever once available for lab interpretation

## 2019-12-28 NOTE — Assessment & Plan Note (Signed)
Stable chronic depression, complicated by Schizophrenia Followed by [facility] On medication management per Psych 

## 2019-12-28 NOTE — Assessment & Plan Note (Addendum)
Prior elevated lipids Last checked 6 weeks ago per [facility] Will not repeat today On statin

## 2019-12-28 NOTE — Assessment & Plan Note (Addendum)
Morbid obesity with BMI >37, with type 2 diabetes, GERD, OSA, Hyperlipidemia Encourage to keep losing weight improve lifestyle diet exercise

## 2019-12-28 NOTE — Patient Instructions (Signed)
Thank you for coming to the office today.  Last A1c at [facility] was 7.1 (11/16/19) - too soon to repeat A1c However, now in Diabetic range START Metformin XR 500mg  daily, 90 day ordered we can adjust this if needed.  Labs today - including Depakote level and Chemistry, and CBC will fax results to Dr Dirk Dress Pysch  ------  Right Knee X-ray - stay tuned for results  Likely sprain / twist injury of medial ligament.  You most likely have a  knee sprain - this is an injury to the ligaments supporting the knee, it can be a minor injury or small tear, rarely it can turn out to be a larger tear to the ligament if it does not improve, we cannot rule this out. Also I am concerned about the possibility of a meniscus injury (the cartilage shock absorber within the knee)  - I recommend a Knee Brace to support your knee and limit motion of the knee to help it heal, avoid excess weight bearing and repetitive activities on it  Recommend topical anti inflammatory Voltaren gel (diclofenc) may use up to 2 to 4 times a day right where you need relief, INSTEAD Of oral ibuprofen if you prefer - This is OTC, call if you need a rx sent instead, usually not always covered.  Recommend to start taking Tylenol Extra Strength 500mg  tabs - take 1 to 2 tabs per dose (max 1000mg ) every 6-8 hours for pain (take regularly, don't skip a dose for next 7 days), max 24 hour daily dose is 6 tablets or 3000mg . In the future you can repeat the same everyday Tylenol course for 1-2 weeks at a time.   If need muscle relaxant in future let me know  Use RICE therapy: - R - Rest / relative rest with activity modification avoid overuse and frequent bending or pressure on bent knee - I - Ice packs (make sure you use a towel or sock / something to protect skin) - C - Compression with ACE wrap or immobilizer apply pressure and reduce swelling allowing more support - E - Elevation - if significant swelling, lift leg above heart level (toes  above your nose) to help reduce swelling, most helpful at night after day of being on your feet  If not improving within  week I would recommend referral to orthopedics for 2nd opinion,  [facility] (formerly [facility]) Address: [address] [place], [place] [address] Hours:  9AM-5PM Phone: [phone]  Orthopedics should also have an Urgent Care Walk In Availability for after hours.   Please schedule a Follow-up Appointment to: Return for keep already scheduled apt.  If you have any other questions or concerns, please feel free to call the office or send a message through [place]. You may also schedule an earlier appointment if necessary.  Additionally, you may be receiving a survey about your experience at our office within a few days to 1 week by e-mail or mail. We value your feedback.  [name], DO [place]

## 2019-12-29 ENCOUNTER — Encounter: Payer: Self-pay | Admitting: Family Medicine

## 2019-12-29 LAB — COMPLETE METABOLIC PANEL WITH GFR
AG Ratio: 1.5 (calc) (ref 1.0–2.5)
ALT: 39 U/L (ref 9–46)
AST: 22 U/L (ref 10–35)
Albumin: 4.5 g/dL (ref 3.6–5.1)
Alkaline phosphatase (APISO): 86 U/L (ref 35–144)
BUN: 18 mg/dL (ref 7–25)
CO2: 23 mmol/L (ref 20–32)
Calcium: 10 mg/dL (ref 8.6–10.3)
Chloride: 100 mmol/L (ref 98–110)
Creat: 1.02 mg/dL (ref 0.70–1.33)
GFR, Est African American: 99 mL/min/{1.73_m2} (ref >=60)
GFR, Est Non African American: 85 mL/min/{1.73_m2} (ref >=60)
Globulin: 3.1 g/dL (calc) (ref 1.9–3.7)
Glucose, Bld: 276 mg/dL — ABNORMAL HIGH (ref 65–99)
Potassium: 4.5 mmol/L (ref 3.5–5.3)
Sodium: 137 mmol/L (ref 135–146)
Total Bilirubin: 0.4 mg/dL (ref 0.2–1.2)
Total Protein: 7.6 g/dL (ref 6.1–8.1)

## 2019-12-29 LAB — CBC WITH DIFFERENTIAL/PLATELET
Absolute Monocytes: 437 cells/uL (ref 200–950)
Basophils Absolute: 41 cells/uL (ref 0–200)
Basophils Relative: 0.9 %
Eosinophils Absolute: 131 cells/uL (ref 15–500)
Eosinophils Relative: 2.9 %
HCT: 41.9 % (ref 38.5–50.0)
Hemoglobin: 13 g/dL — ABNORMAL LOW (ref 13.2–17.1)
Lymphs Abs: 1566 cells/uL (ref 850–3900)
MCH: 28 pg (ref 27.0–33.0)
MCHC: 31 g/dL — ABNORMAL LOW (ref 32.0–36.0)
MCV: 90.3 fL (ref 80.0–100.0)
MPV: 12.2 fL (ref 7.5–12.5)
Monocytes Relative: 9.7 %
Neutro Abs: 2327 cells/uL (ref 1500–7800)
Neutrophils Relative %: 51.7 %
Platelets: 206 10*3/uL (ref 140–400)
RBC: 4.64 10*6/uL (ref 4.20–5.80)
RDW: 14.2 % (ref 11.0–15.0)
Total Lymphocyte: 34.8 %
WBC: 4.5 10*3/uL (ref 3.8–10.8)

## 2019-12-29 LAB — VALPROIC ACID LEVEL: Valproic Acid Lvl: 49 mg/L — ABNORMAL LOW (ref 50.0–100.0)

## 2019-12-29 MED ORDER — MELOXICAM 15 MG TABLET
0 days
Start: 2019-12-29 — End: ?

## 2020-01-04 DIAGNOSIS — R69 Illness, unspecified: Secondary | ICD-10-CM | POA: Diagnosis not present

## 2020-02-08 ENCOUNTER — Telehealth: Payer: Self-pay | Admitting: Family Medicine

## 2020-02-08 NOTE — Telephone Encounter (Signed)
Copied from [place] [id number]. Topic: Medicare AWV >> Feb 08, 2020  2:51 PM Cher Nakai R wrote: Reason for CRM:   Left message for patient to call back and schedule the Medicare Annual Wellness Visit (AWV) virtually.  Last AWV  05/20/2018  Please schedule at anytime with [facility].  40 minute appointment  Any questions, please call me at [phone]

## 2020-02-15 ENCOUNTER — Ambulatory Visit
Admit: 2020-02-15 | Discharge: 2020-02-16 | Payer: MEDICARE | Attending: Student in an Organized Health Care Education/Training Program | Primary: Student in an Organized Health Care Education/Training Program

## 2020-02-15 DIAGNOSIS — Z79899 Other long term (current) drug therapy: Secondary | ICD-10-CM | POA: Diagnosis not present

## 2020-02-15 DIAGNOSIS — R69 Illness, unspecified: Secondary | ICD-10-CM | POA: Diagnosis not present

## 2020-02-15 DIAGNOSIS — F250 Schizoaffective disorder, bipolar type: Principal | ICD-10-CM

## 2020-02-15 MED ORDER — DOXEPIN 10 MG CAPSULE
ORAL_CAPSULE | Freq: Every evening | ORAL | 2 refills | 30 days | Status: CP
Start: 2020-02-15 — End: 2020-05-02

## 2020-02-15 MED ORDER — DIVALPROEX ER 500 MG TABLET,EXTENDED RELEASE 24 HR
ORAL_TABLET | Freq: Every evening | ORAL | 1 refills | 90.00000 days | Status: CP
Start: 2020-02-15 — End: 2020-05-02

## 2020-02-15 MED ORDER — OLANZAPINE 10 MG TABLET
ORAL_TABLET | Freq: Every evening | ORAL | 2 refills | 30.00000 days | Status: CP
Start: 2020-02-15 — End: 2020-03-16

## 2020-05-02 ENCOUNTER — Telehealth
Admit: 2020-05-02 | Discharge: 2020-05-03 | Payer: MEDICARE | Attending: Student in an Organized Health Care Education/Training Program | Primary: Student in an Organized Health Care Education/Training Program

## 2020-05-02 DIAGNOSIS — Z79899 Other long term (current) drug therapy: Secondary | ICD-10-CM | POA: Diagnosis not present

## 2020-05-02 DIAGNOSIS — R69 Illness, unspecified: Secondary | ICD-10-CM | POA: Diagnosis not present

## 2020-05-02 DIAGNOSIS — G4700 Insomnia, unspecified: Secondary | ICD-10-CM | POA: Diagnosis not present

## 2020-05-02 DIAGNOSIS — F250 Schizoaffective disorder, bipolar type: Principal | ICD-10-CM

## 2020-05-02 MED ORDER — DIVALPROEX ER 500 MG TABLET,EXTENDED RELEASE 24 HR
ORAL_TABLET | Freq: Every evening | ORAL | 1 refills | 90 days | Status: CP
Start: 2020-05-02 — End: 2020-07-31

## 2020-05-02 MED ORDER — FLUOXETINE 20 MG CAPSULE
ORAL_CAPSULE | Freq: Every day | ORAL | 1 refills | 90 days | Status: CP
Start: 2020-05-02 — End: 2021-05-02

## 2020-05-02 MED ORDER — DOXEPIN 10 MG CAPSULE
ORAL_CAPSULE | Freq: Every evening | ORAL | 2 refills | 30 days | Status: CP
Start: 2020-05-02 — End: ?

## 2020-05-02 MED ORDER — OLANZAPINE 10 MG TABLET
ORAL_TABLET | 0 refills | 0 days | Status: CP
Start: 2020-05-02 — End: ?

## 2020-05-02 MED ORDER — ARIPIPRAZOLE 10 MG TABLET
ORAL_TABLET | 0 refills | 0 days | Status: CP
Start: 2020-05-02 — End: ?

## 2020-05-06 ENCOUNTER — Ambulatory Visit (INDEPENDENT_AMBULATORY_CARE_PROVIDER_SITE_OTHER): Payer: Medicare HMO | Admitting: Family Medicine

## 2020-05-06 ENCOUNTER — Other Ambulatory Visit: Payer: Self-pay

## 2020-05-06 ENCOUNTER — Encounter: Payer: Self-pay | Admitting: Family Medicine

## 2020-05-06 VITALS — BP 137/80 | HR 128 | Ht 71.0 in | Wt 261.8 lb

## 2020-05-06 DIAGNOSIS — F200 Paranoid schizophrenia: Secondary | ICD-10-CM

## 2020-05-06 DIAGNOSIS — R69 Illness, unspecified: Secondary | ICD-10-CM | POA: Diagnosis not present

## 2020-05-06 DIAGNOSIS — E1169 Type 2 diabetes mellitus with other specified complication: Secondary | ICD-10-CM | POA: Diagnosis not present

## 2020-05-06 DIAGNOSIS — F339 Major depressive disorder, recurrent, unspecified: Secondary | ICD-10-CM | POA: Diagnosis not present

## 2020-05-06 LAB — POCT UA - MICROALBUMIN: Microalbumin Ur, POC: 20 mg/L

## 2020-05-06 LAB — POCT GLYCOSYLATED HEMOGLOBIN (HGB A1C): Hemoglobin A1C: 7.8 % — AB (ref 4.0–5.6)

## 2020-05-06 MED ORDER — METFORMIN HCL ER 500 MG PO TB24: 500.0000 mg | ORAL_TABLET | Freq: Two times a day (BID) | ORAL | 1 refills | Status: DC

## 2020-05-06 NOTE — Assessment & Plan Note (Signed)
Stable chronic depression, complicated by Schizophrenia Followed by [facility] On medication management per Psych

## 2020-05-06 NOTE — Assessment & Plan Note (Signed)
Elevated M2T at 7.8 Complications - hyperlipidemia, GERD, depression, schizophrenia, obesity, OSA -  increases risk of future cardiovascular complications   Plan:  1. Increase Metformin XR 500mg  BID or x 2 = 1000mg  daily with PM meal 2. Encourage improved lifestyle - low carb, low sugar diet, reduce portion size, continue improving regular exercise 3. Check CBG , bring log to next visit for review 4. Continue Statin 5. Check urine micro today, negative result 20

## 2020-05-06 NOTE — Progress Notes (Signed)
Subjective:    Patient ID: Randy [name], male    DOB: 06-14-1970, 50 y.o.   MRN: [id number]  Randy [name] is a 50 y.o. male presenting on 05/06/2020 for Diabetes   HPI   Schizophrenia / Major Depression recurrent Followed by [facility] for specialized Schizophrenia patients, chart reviewedfrom last visit,documentation shows patient has history of fetal alcohol syndrome, recurrent major depression and schizophrenia.  - Today he is reporting overall doing well, there was some discussion about adjusting his medications recently based on chart review. - he had lab for Depakote level checked 05/06/19 (31.8 result), and then again on 11/16/19 result 60.0 Recently seen by [facility] 04/2020 - telemedicine, they are tapering off Zyprexa and onto Abilify and continued on Depakote ER - He has had some agitation  Type 2 Diabetes - new diagnosis/Morbid Obesity BMI Due for A1c today anti-psychotic/schizophrenia medication causing hyperglycemia Meds:Metformin XR 500mg  daily admits some GI intolerance Lifestyle: - Diet (He is not following any particular diet,but has reduced carb / starches) - Exercise (Walking regularly for activity and exercise) He needs a new optometry eye doctor for DM Eye exam Denies hypoglycemia, polyuria, visual changes, numbness or tingling  History of cancer He has reduced tobacco / chew   Depression screen [facility] 2/9 05/06/2020 11/04/2019 10/14/2018  Decreased Interest 0 0 0  Down, Depressed, Hopeless 2 2 0  PHQ - 2 Score 2 2 0  Altered sleeping 0 0 -  Tired, decreased energy 1 1 -  Change in appetite 2 2 -  Feeling bad or failure about yourself  0 0 -  Trouble concentrating 0 0 -  Moving slowly or fidgety/restless 2 2 -  Suicidal thoughts 0 0 -  PHQ-9 Score 7 7 -  Difficult doing work/chores Somewhat difficult Somewhat difficult -    Social History   Tobacco Use  . Smoking status: Former Smoker    Packs/day: 0.00    Years: 0.00    Pack years: 0.00   . Smokeless tobacco: Former Systems developer    Types: Secondary school teacher  . Vaping Use: Never used  Substance Use Topics  . Alcohol use: No  . Drug use: Never    Review of Systems Per HPI unless specifically indicated above     Objective:    BP 137/80   Pulse (!) 128   Ht 5\' 11"  (1.803 m)   Wt 261 lb 12.8 oz (118.8 kg)   SpO2 94%   BMI 36.51 kg/m   Wt Readings from Last 3 Encounters:  05/06/20 261 lb 12.8 oz (118.8 kg)  12/28/19 271 lb 9.6 oz (123.2 kg)  11/04/19 265 lb (120.2 kg)    Physical Exam Vitals and nursing note reviewed.  Constitutional:      General: He is not in acute distress.    Appearance: He is well-developed and well-nourished. He is not diaphoretic.     Comments: Well-appearing, comfortable, cooperative  HENT:     Head: Normocephalic and atraumatic.     Mouth/Throat:     Mouth: Oropharynx is clear and moist.  Eyes:     General:        Right eye: No discharge.        Left eye: No discharge.     Conjunctiva/sclera: Conjunctivae normal.  Cardiovascular:     Rate and Rhythm: Normal rate.  Pulmonary:     Effort: Pulmonary effort is normal.  Musculoskeletal:        General: No edema.  Skin:    General: Skin is warm and dry.     Findings: No erythema or rash.  Neurological:     Mental Status: He is alert and oriented to person, place, and time.  Psychiatric:        Mood and Affect: Mood and affect normal.        Behavior: Behavior normal.     Comments: Well groomed, good eye contact, normal speech and thoughts        Results for orders placed or performed in visit on 05/06/20  POCT HgB A1C  Result Value Ref Range   Hemoglobin A1C 7.8 (A) 4.0 - 5.6 %  POCT UA - Microalbumin  Result Value Ref Range   Microalbumin Ur, POC 20 mg/L   Creatinine, POC     Albumin/Creatinine Ratio, Urine, POC        Assessment & Plan:   Problem List Items Addressed This Visit    Type 2 diabetes mellitus with other specified complication ([place]) - Primary    Elevated  P5W at 7.8 Complications - hyperlipidemia, GERD, depression, schizophrenia, obesity, OSA -  increases risk of future cardiovascular complications   Plan:  1. Increase Metformin XR 500mg  BID or x 2 = 1000mg  daily with PM meal 2. Encourage improved lifestyle - low carb, low sugar diet, reduce portion size, continue improving regular exercise 3. Check CBG , bring log to next visit for review 4. Continue Statin 5. Check urine micro today, negative result 20      Relevant Medications   metFORMIN (GLUCOPHAGE-XR) 500 MG 24 hr tablet   Other Relevant Orders   POCT HgB A1C (Completed)   POCT UA - Microalbumin (Completed)   Schizophrenia (HCC)    Chronic Schizophrenia, relatively stable Followed by [facility] On med management per Psych, they are tapering zyprexa to abilify Advised that he should f/u for Depakote level if we need to check in future we can, can fax result to his Psychiatry      Morbid obesity ([place])   Relevant Medications   metFORMIN (GLUCOPHAGE-XR) 500 MG 24 hr tablet   Chronic recurrent major depressive disorder ([place])    Stable chronic depression, complicated by Schizophrenia Followed by [facility] On medication management per Psych           Meds ordered this encounter  Medications  . metFORMIN (GLUCOPHAGE-XR) 500 MG 24 hr tablet    Sig: Take 1 tablet (500 mg total) by mouth 2 (two) times daily with a meal.    Dispense:  180 tablet    Refill:  1      Follow up plan: Return in about 6 months (around 11/03/2020) for 6 month Annual Physical (fasting lab after apt in AM).  Will add Depakote level in 10/2020 - or can check sooner if needed by Psych.  [name], [facility] 05/06/2020, 2:40 PM

## 2020-05-06 NOTE — Assessment & Plan Note (Signed)
Chronic Schizophrenia, relatively stable Followed by [facility] On med management per Psych, they are tapering zyprexa to abilify Advised that he should f/u for Depakote level if we need to check in future we can, can fax result to his Psychiatry

## 2020-05-06 NOTE — Patient Instructions (Addendum)
Thank you for coming to the office today.  Increase Metformin XR 500mg  - take one TWICE a day with meal, morning and evening with meal, if not tolerated we can switch to 2 pills at once in evening with dinner.  In future if you lower sugar naturally we can reduce this  May need to reconsider other options in future.  Urine test today for protein.  Ask PSychiatry about checking Depakote levels.  Your provider would like to you have your annual eye exam. Please contact your current eye doctor or here are some good options for you to contact.   [facility]   Address: [address] [place], [place] [address] Phone: [phone]  Website: [email or web address] [address] [place], [place] [address] Phone: [phone] [email or web address]  [facility]  Address: [place], [place], [place] [address] Phone: [phone]   [facility] [address] [place], [place] [address] Phone: [phone]  [facility] Address: [place], [place], [place] [address]  Phone: [phone]  DUE for [place] (no food or drink after midnight before the lab appointment, only water or coffee without cream/sugar on the morning of)  SCHEDULE "Lab Only" visit in the morning at the clinic for lab draw in 6 MONTHS   - Make sure Lab Only appointment is at about 1 week before your next appointment, so that results will be available  For Lab Results, once available within 2-3 days of blood draw, you can can log in to MyChart online to view your results and a brief explanation. Also, we can discuss results at next follow-up visit.    Please schedule a Follow-up Appointment to: Return in about 6 months (around 11/03/2020) for 6 month Annual Physical (fasting lab after apt in AM).  If you have any other questions or concerns, please feel free to call the office or send a message through [place]. You may also schedule an earlier  appointment if necessary.  Additionally, you may be receiving a survey about your experience at our office within a few days to 1 week by e-mail or mail. We value your feedback.  [name], DO [place]

## 2020-05-30 MED ORDER — ARIPIPRAZOLE 10 MG TABLET
ORAL_TABLET | Freq: Every day | ORAL | 2 refills | 30.00000 days | Status: CP
Start: 2020-05-30 — End: 2020-06-29

## 2020-06-06 ENCOUNTER — Telehealth
Admit: 2020-06-06 | Discharge: 2020-06-07 | Payer: MEDICARE | Attending: Student in an Organized Health Care Education/Training Program | Primary: Student in an Organized Health Care Education/Training Program

## 2020-06-06 MED ORDER — OLANZAPINE 5 MG TABLET
ORAL_TABLET | 1 refills | 0 days | Status: CP
Start: 2020-06-06 — End: ?

## 2020-06-21 MED ORDER — OLANZAPINE 10 MG TABLET
ORAL_TABLET | 0 refills | 0 days
Start: 2020-06-21 — End: ?

## 2020-06-24 ENCOUNTER — Other Ambulatory Visit: Payer: Self-pay | Admitting: Family Medicine

## 2020-06-24 DIAGNOSIS — E1169 Type 2 diabetes mellitus with other specified complication: Secondary | ICD-10-CM

## 2020-06-28 MED ORDER — FLUOXETINE 20 MG CAPSULE
ORAL_CAPSULE | Freq: Every day | ORAL | 1 refills | 90 days | Status: CP
Start: 2020-06-28 — End: 2021-06-28

## 2020-06-28 MED ORDER — DIVALPROEX ER 500 MG TABLET,EXTENDED RELEASE 24 HR
ORAL_TABLET | Freq: Every evening | ORAL | 1 refills | 90.00000 days | Status: CP
Start: 2020-06-28 — End: 2020-09-26

## 2020-06-28 MED ORDER — ARIPIPRAZOLE 10 MG TABLET
ORAL_TABLET | Freq: Every day | ORAL | 2 refills | 30.00000 days | Status: CP
Start: 2020-06-28 — End: 2020-07-28

## 2020-06-28 MED ORDER — DOXEPIN 10 MG CAPSULE
ORAL_CAPSULE | Freq: Every evening | ORAL | 2 refills | 30 days | Status: CP
Start: 2020-06-28 — End: ?

## 2020-07-05 MED ORDER — ARIPIPRAZOLE 10 MG TABLET
ORAL_TABLET | Freq: Every day | ORAL | 2 refills | 0.00000 days
Start: 2020-07-05 — End: 2020-08-04

## 2020-07-06 MED ORDER — DOXEPIN 10 MG CAPSULE
ORAL_CAPSULE | Freq: Every evening | ORAL | 2 refills | 30.00000 days
Start: 2020-07-06 — End: ?

## 2020-07-06 MED ORDER — ARIPIPRAZOLE 10 MG TABLET
ORAL_TABLET | Freq: Every day | ORAL | 2 refills | 0.00000 days
Start: 2020-07-06 — End: 2020-08-05

## 2020-07-07 MED ORDER — ARIPIPRAZOLE 10 MG TABLET
ORAL_TABLET | Freq: Every day | ORAL | 2 refills | 30 days | Status: CP
Start: 2020-07-07 — End: 2020-08-06

## 2020-07-13 MED ORDER — DOXEPIN 10 MG CAPSULE
ORAL_CAPSULE | Freq: Every evening | ORAL | 1 refills | 90.00000 days | Status: CP
Start: 2020-07-13 — End: 2020-10-11

## 2020-07-25 ENCOUNTER — Telehealth
Admit: 2020-07-25 | Discharge: 2020-07-26 | Payer: MEDICARE | Attending: Student in an Organized Health Care Education/Training Program | Primary: Student in an Organized Health Care Education/Training Program

## 2020-07-25 MED ORDER — FLUOXETINE 20 MG CAPSULE
ORAL_CAPSULE | Freq: Every day | ORAL | 1 refills | 90 days | Status: CP
Start: 2020-07-25 — End: 2021-07-25

## 2020-07-25 MED ORDER — DIVALPROEX ER 500 MG TABLET,EXTENDED RELEASE 24 HR
ORAL_TABLET | Freq: Every evening | ORAL | 1 refills | 90 days | Status: CP
Start: 2020-07-25 — End: 2020-10-23

## 2020-07-25 MED ORDER — ARIPIPRAZOLE 10 MG TABLET
ORAL_TABLET | Freq: Every day | ORAL | 3 refills | 60 days | Status: CP
Start: 2020-07-25 — End: 2020-08-24

## 2020-07-25 MED ORDER — DOXEPIN 10 MG CAPSULE
ORAL_CAPSULE | Freq: Every evening | ORAL | 1 refills | 90 days | Status: CP
Start: 2020-07-25 — End: 2020-10-23

## 2020-09-27 ENCOUNTER — Ambulatory Visit (INDEPENDENT_AMBULATORY_CARE_PROVIDER_SITE_OTHER): Payer: Medicare HMO

## 2020-09-27 VITALS — Ht 71.0 in | Wt 265.0 lb

## 2020-09-27 DIAGNOSIS — Z0000 Encounter for general adult medical examination without abnormal findings: Secondary | ICD-10-CM

## 2020-09-27 NOTE — Patient Instructions (Signed)
Randy [name] , Thank you for taking time to come for your Medicare Wellness Visit. I appreciate your ongoing commitment to your health goals. Please review the following plan we discussed and let me know if I can assist you in the future.   Screening recommendations/referrals: Colonoscopy: completed 04/12/2017, currently due Recommended yearly ophthalmology/optometry visit for glaucoma screening and checkup Recommended yearly dental visit for hygiene and checkup  Vaccinations: Influenza vaccine: completed 11/29/2019, due 10/24/2020 Pneumococcal vaccine: due Tdap vaccine: due Shingles vaccine: discussed   Covid-19: 07/14/2019, 06/16/2019 will bring card in to next appointment for other dates  Advanced directives: Advance directive discussed with you today.   Conditions/risks identified: none  Next appointment: Follow up in one year for your annual wellness visit   Preventive Care 40-64 Years, Male Preventive care refers to lifestyle choices and visits with your health care provider that can promote health and wellness. What does preventive care include? A yearly physical exam. This is also called an annual well check. Dental exams once or twice a year. Routine eye exams. Ask your health care provider how often you should have your eyes checked. Personal lifestyle choices, including: Daily care of your teeth and gums. Regular physical activity. Eating a healthy diet. Avoiding tobacco and drug use. Limiting alcohol use. Practicing safe sex. Taking low-dose aspirin every day starting at age 28. What happens during an annual well check? The services and screenings done by your health care provider during your annual well check will depend on your age, overall health, lifestyle risk factors, and family history of disease. Counseling  Your health care provider may ask you questions about your: Alcohol use. Tobacco use. Drug use. Emotional well-being. Home and relationship well-being. Sexual  activity. Eating habits. Work and work Statistician. Screening  You may have the following tests or measurements: Height, weight, and BMI. Blood pressure. Lipid and cholesterol levels. These may be checked every 5 years, or more frequently if you are over 59 years old. Skin check. Lung cancer screening. You may have this screening every year starting at age 14 if you have a 30-pack-year history of smoking and currently smoke or have quit within the past 15 years. Fecal occult blood test (FOBT) of the stool. You may have this test every year starting at age 59. Flexible sigmoidoscopy or colonoscopy. You may have a sigmoidoscopy every 5 years or a colonoscopy every 10 years starting at age 13. Prostate cancer screening. Recommendations will vary depending on your family history and other risks. Hepatitis C blood test. Hepatitis B blood test. Sexually transmitted disease (STD) testing. Diabetes screening. This is done by checking your blood sugar (glucose) after you have not eaten for a while (fasting). You may have this done every 1-3 years. Discuss your test results, treatment options, and if necessary, the need for more tests with your health care provider. Vaccines  Your health care provider may recommend certain vaccines, such as: Influenza vaccine. This is recommended every year. Tetanus, diphtheria, and acellular pertussis (Tdap, Td) vaccine. You may need a Td booster every 10 years. Zoster vaccine. You may need this after age 28. Pneumococcal 13-valent conjugate (PCV13) vaccine. You may need this if you have certain conditions and have not been vaccinated. Pneumococcal polysaccharide (PPSV23) vaccine. You may need one or two doses if you smoke cigarettes or if you have certain conditions. Talk to your health care provider about which screenings and vaccines you need and how often you need them. This information is not intended to replace  advice given to you by your health care provider.  Make sure you discuss any questions you have with your health care provider. Document Released: 04/08/2015 Document Revised: 11/30/2015 Document Reviewed: 01/11/2015 Elsevier Interactive Patient Education  2017 [place] Prevention in the Home Falls can cause injuries. They can happen to people of all ages. There are many things you can do to make your home safe and to help prevent falls. What can I do on the outside of my home? Regularly fix the edges of walkways and driveways and fix any cracks. Remove anything that might make you trip as you walk through a door, such as a raised step or threshold. Trim any bushes or trees on the path to your home. Use bright outdoor lighting. Clear any walking paths of anything that might make someone trip, such as rocks or tools. Regularly check to see if handrails are loose or broken. Make sure that both sides of any steps have handrails. Any raised decks and porches should have guardrails on the edges. Have any leaves, snow, or ice cleared regularly. Use sand or salt on walking paths during winter. Clean up any spills in your garage right away. This includes oil or grease spills. What can I do in the bathroom? Use night lights. Install grab bars by the toilet and in the tub and shower. Do not use towel bars as grab bars. Use non-skid mats or decals in the tub or shower. If you need to sit down in the shower, use a plastic, non-slip stool. Keep the floor dry. Clean up any water that spills on the floor as soon as it happens. Remove soap buildup in the tub or shower regularly. Attach bath mats securely with double-sided non-slip rug tape. Do not have throw rugs and other things on the floor that can make you trip. What can I do in the bedroom? Use night lights. Make sure that you have a light by your bed that is easy to reach. Do not use any sheets or blankets that are too big for your bed. They should not hang down onto the floor. Have a  firm chair that has side arms. You can use this for support while you get dressed. Do not have throw rugs and other things on the floor that can make you trip. What can I do in the kitchen? Clean up any spills right away. Avoid walking on wet floors. Keep items that you use a lot in easy-to-reach places. If you need to reach something above you, use a strong step stool that has a grab bar. Keep electrical cords out of the way. Do not use floor polish or wax that makes floors slippery. If you must use wax, use non-skid floor wax. Do not have throw rugs and other things on the floor that can make you trip. What can I do with my stairs? Do not leave any items on the stairs. Make sure that there are handrails on both sides of the stairs and use them. Fix handrails that are broken or loose. Make sure that handrails are as long as the stairways. Check any carpeting to make sure that it is firmly attached to the stairs. Fix any carpet that is loose or worn. Avoid having throw rugs at the top or bottom of the stairs. If you do have throw rugs, attach them to the floor with carpet tape. Make sure that you have a light switch at the top of the stairs and the bottom  of the stairs. If you do not have them, ask someone to add them for you. What else can I do to help prevent falls? Wear shoes that: Do not have high heels. Have rubber bottoms. Are comfortable and fit you well. Are closed at the toe. Do not wear sandals. If you use a stepladder: Make sure that it is fully opened. Do not climb a closed stepladder. Make sure that both sides of the stepladder are locked into place. Ask someone to hold it for you, if possible. Clearly mark and make sure that you can see: Any grab bars or handrails. First and last steps. Where the edge of each step is. Use tools that help you move around (mobility aids) if they are needed. These include: Canes. Walkers. Scooters. Crutches. Turn on the lights when you  go into a dark area. Replace any light bulbs as soon as they burn out. Set up your furniture so you have a clear path. Avoid moving your furniture around. If any of your floors are uneven, fix them. If there are any pets around you, be aware of where they are. Review your medicines with your doctor. Some medicines can make you feel dizzy. This can increase your chance of falling. Ask your doctor what other things that you can do to help prevent falls. This information is not intended to replace advice given to you by your health care provider. Make sure you discuss any questions you have with your health care provider. Document Released: 01/06/2009 Document Revised: 08/18/2015 Document Reviewed: 04/16/2014 Elsevier Interactive Patient Education  2017 [employer].

## 2020-09-27 NOTE — Progress Notes (Signed)
I connected with Damita Dunnings today by telephone and verified that I am speaking with the correct person using two identifiers. Location patient: home Location provider: work Persons participating in the virtual visit: [place], Geffre LPN.   I discussed the limitations, risks, security and privacy concerns of performing an evaluation and management service by telephone and the availability of in person appointments. I also discussed with the patient that there may be a patient responsible charge related to this service. The patient expressed understanding and verbally consented to this telephonic visit.    Interactive audio and video telecommunications were attempted between this provider and patient, however failed, due to patient having technical difficulties OR patient did not have access to video capability.  We continued and completed visit with audio only.     Vital signs may be patient reported or missing.  Subjective:   Randy [name] is a 50 y.o. male who presents for Medicare Annual/Subsequent preventive examination.  Review of Systems     Cardiac Risk Factors include: diabetes mellitus;dyslipidemia;male gender;obesity (BMI >30kg/m2)     Objective:    Today's Vitals   09/27/20 1437  Weight: 265 lb (120.2 kg)  Height: 5\' 11"  (1.803 m)   Body mass index is 36.96 kg/m.  Advanced Directives 09/27/2020 05/20/2018 11/30/2017 03/20/2016  Does Patient Have a Medical Advance Directive? No No No No  Would patient like information on creating a medical advance directive? - No - Patient declined No - Patient declined -    Current Medications (verified) Outpatient Encounter Medications as of 09/27/2020  Medication Sig   albuterol (PROAIR HFA) 108 (90 Base) MCG/ACT inhaler Inhale 1-2 puffs into the lungs every 6 (six) hours as needed for wheezing or shortness of breath.   atorvastatin (LIPITOR) 20 MG tablet Take 1 tablet (20 mg total) by mouth daily.   cetirizine (ZYRTEC)  10 MG tablet Take 1 tablet (10 mg total) by mouth daily.   doxepin (SINEQUAN) 10 MG capsule Take 20 mg by mouth at bedtime.   FLUoxetine (PROZAC) 20 MG capsule Take 20 mg by mouth daily.   metFORMIN (GLUCOPHAGE-XR) 500 MG 24 hr tablet Take 1 tablet (500 mg total) by mouth 2 (two) times daily with a meal.   OLANZapine (ZYPREXA) 10 MG tablet Take 2.5 tablets (25mg ) every night for 1 week. Then, take 2 tablets (20mg ) every night for 1 week. Then, take 1.5 tablets (15mg ) for 1 week. Then, take 1 tablet (10mg ) every night.   omeprazole (PRILOSEC) 20 MG capsule TAKE ONE CAPSULE BY MOUTH DAILY BEFORE BREAKFAST   ARIPiprazole (ABILIFY) 10 MG tablet Take 10 mg by mouth daily.   divalproex (DEPAKOTE ER) 500 MG 24 hr tablet Take 1,000 mg by mouth at bedtime.   No facility-administered encounter medications on file as of 09/27/2020.    Allergies (verified) Patient has no known allergies.   History: Past Medical History:  Diagnosis Date   Allergy    COPD (chronic obstructive pulmonary disease) (HCC)    GERD (gastroesophageal reflux disease)    History reviewed. No pertinent surgical history. Family History  Problem Relation Age of Onset   Alcohol abuse Mother    Cancer Father        colon    Colon cancer Father    Social History   Socioeconomic History   Marital status: Married    Spouse name: Not on file   Number of children: Not on file   Years of education: Not on file  Highest education level: 11th grade  Occupational History   Occupation: disability   Tobacco Use   Smoking status: Former    Packs/day: 0.00    Years: 0.00    Pack years: 0.00    Types: Cigarettes   Smokeless tobacco: Former    Types: Nurse, children's Use: Never used  Substance and Sexual Activity   Alcohol use: No   Drug use: Never   Sexual activity: Yes  Other Topics Concern   Not on file  Social History Narrative   Ongoing financial constraints mentioned during phone call today   Social  Determinants of Health   Financial Resource Strain: Low Risk    Difficulty of Paying Living Expenses: Not hard at all  Food Insecurity: No Food Insecurity   Worried About Charity fundraiser in the Last Year: Never true   [place] in the Last Year: Never true  Transportation Needs: No Transportation Needs   Lack of Transportation (Medical): No   Lack of Transportation (Non-Medical): No  Physical Activity: Insufficiently Active   Days of Exercise per Week: 3 days   Minutes of Exercise per Session: 40 min  Stress: No Stress Concern Present   Feeling of Stress : Not at all  Social Connections: Not on file    Tobacco Counseling Counseling given: Not Answered   Clinical Intake:  Pre-visit preparation completed: Yes  Pain : No/denies pain     Nutritional Status: BMI > 30  Obese Nutritional Risks: None Diabetes: Yes  How often do you need to have someone help you when you read instructions, pamphlets, or other written materials from your doctor or pharmacy?: 1 - Never What is the last grade level you completed in school?: 11th grade  Diabetic? Yes Nutrition Risk Assessment:  Has the patient had any N/V/D within the last 2 months?  No  Does the patient have any non-healing wounds?  No  Has the patient had any unintentional weight loss or weight gain?  Yes   Diabetes:  Is the patient diabetic?  Yes  If diabetic, was a CBG obtained today?  No  Did the patient bring in their glucometer from home?  No  How often do you monitor your CBG's? Does not check.   Financial Strains and Diabetes Management:  Are you having any financial strains with the device, your supplies or your medication? No .  Does the patient want to be seen by Chronic Care Management for management of their diabetes?  No  Would the patient like to be referred to a Nutritionist or for Diabetic Management?  No   Diabetic Exams:  Diabetic Eye Exam: Overdue for diabetic eye exam. Pt has been advised  about the importance in completing this exam. Patient advised to call and schedule an eye exam. Diabetic Foot Exam: Completed 12/28/2019   Interpreter Needed?: No  Information entered by :: NAllen LPN   Activities of Daily Living In your present state of health, do you have any difficulty performing the following activities: 09/27/2020 11/04/2019  Hearing? N N  Vision? N N  Difficulty concentrating or making decisions? N Y  Walking or climbing stairs? N N  Dressing or bathing? N N  Doing errands, shopping? N N  Preparing Food and eating ? N -  Using the Toilet? N -  In the past six months, have you accidently leaked urine? N -  Do you have problems with loss of bowel control? N -  Managing your Medications? Y -  Comment wife sets up medications -  Managing your Finances? N -  Housekeeping or managing your Housekeeping? N -  Some recent data might be hidden    Patient Care Team: Olin Hauser, DO as PCP - General (Family Medicine) Greg Cutter, LCSW as Social Worker (Licensed Clinical Social Worker)  Indicate any recent [employer] you may have received from other than [facility] providers in the past year (date may be approximate).     Assessment:   This is a routine wellness examination for Jalyn.  Hearing/Vision screen Vision Screening - Comments:: No regular eye exams,  Dietary issues and exercise activities discussed: Current Exercise Habits: Home exercise routine, Type of exercise: walking, Time (Minutes): 40, Frequency (Times/Week): 3, Weekly Exercise (Minutes/Week): 120   Goals Addressed             This Visit's Progress    Patient Stated       09/27/2020, no goals        Depression Screen PHQ 2/9 Scores 09/27/2020 05/06/2020 11/04/2019 10/14/2018 05/20/2018 05/20/2018 11/29/2017  PHQ - 2 Score 0 2 2 0 0 0 0  PHQ- 9 Score - 7 7 - 6 - -    Fall Risk Fall Risk  09/27/2020 10/14/2018 05/20/2018 11/29/2017 11/14/2017  Falls in the past year? 0 1 0 No Yes   Number falls in past yr: - - - - 1  Injury with Fall? - 1 - - Yes  Risk for fall due to : Medication side effect - - - -  Follow up Falls evaluation completed;Education provided;Falls prevention discussed Falls evaluation completed - - -    FALL RISK PREVENTION PERTAINING TO THE HOME:  Any stairs in or around the home? No  If so, are there any without handrails? N/a Home free of loose throw rugs in walkways, pet beds, electrical cords, etc? Yes  Adequate lighting in your home to reduce risk of falls? Yes   ASSISTIVE DEVICES UTILIZED TO PREVENT FALLS:  Life alert? No  Use of a cane, walker or w/c? No  Grab bars in the bathroom? No  Shower chair or bench in shower? No  Elevated toilet seat or a handicapped toilet? Yes   TIMED UP AND GO:  Was the test performed? No .       Cognitive Function:     6CIT Screen 09/27/2020 05/20/2018  What Year? 0 points 0 points  What month? 0 points 0 points  What time? 0 points 0 points  Count back from 20 0 points 0 points  Months in reverse 0 points 0 points  Repeat phrase 6 points 2 points  Total Score 6 2    Immunizations Immunization History  Administered Date(s) Administered   Influenza Inj Mdck Quad With Preservative 01/13/2018   Influenza,inj,Quad PF,6+ Mos 12/05/2018, 11/29/2019   Influenza-Unspecified 12/17/2016, 01/07/2018   PFIZER(Purple Top)SARS-COV-2 Vaccination 06/16/2019, 07/14/2019   Pneumococcal Polysaccharide-23 04/10/2017    TDAP status: Due, Education has been provided regarding the importance of this vaccine. Advised may receive this vaccine at local pharmacy or Health Dept. Aware to provide a copy of the vaccination record if obtained from local pharmacy or Health Dept. Verbalized acceptance and understanding.  Flu Vaccine status: Up to date  Pneumococcal vaccine status: Due, Education has been provided regarding the importance of this vaccine. Advised may receive this vaccine at local pharmacy or Health Dept.  Aware to provide a copy of the vaccination record if obtained from  local pharmacy or Health Dept. Verbalized acceptance and understanding.  Covid-19 vaccine status: Completed vaccines  Qualifies for Shingles Vaccine? Yes   Zostavax completed No   Shingrix Completed?: No.    Education has been provided regarding the importance of this vaccine. Patient has been advised to call insurance company to determine out of pocket expense if they have not yet received this vaccine. Advised may also receive vaccine at local pharmacy or Health Dept. Verbalized acceptance and understanding.  Screening Tests Health Maintenance  Topic Date Due   Pneumococcal Vaccine 14-35 Years old (1 - PCV) Never done   OPHTHALMOLOGY EXAM  Never done   Hepatitis C Screening  Never done   TETANUS/TDAP  Never done   Zoster Vaccines- Shingrix (1 of 2) Never done   COLONOSCOPY (Pts 45-77yrs Insurance coverage will need to be confirmed)  04/12/2018   COVID-19 Vaccine (3 - Pfizer risk series) 08/11/2019   HIV Screening  05/06/2021 (Originally 09/27/1985)   INFLUENZA VACCINE  10/24/2020   HEMOGLOBIN A1C  11/03/2020   FOOT EXAM  12/27/2020   URINE MICROALBUMIN  05/06/2021   PNEUMOCOCCAL POLYSACCHARIDE VACCINE AGE 77-64 HIGH RISK  Completed   HPV VACCINES  Aged Out    Health Maintenance  Health Maintenance Due  Topic Date Due   Pneumococcal Vaccine 44-42 Years old (1 - PCV) Never done   OPHTHALMOLOGY EXAM  Never done   Hepatitis C Screening  Never done   TETANUS/TDAP  Never done   Zoster Vaccines- Shingrix (1 of 2) Never done   COLONOSCOPY (Pts 45-19yrs Insurance coverage will need to be confirmed)  04/12/2018   COVID-19 Vaccine (3 - Pfizer risk series) 08/11/2019    Colorectal cancer screening: Type of screening: Colonoscopy. Completed 04/12/2017. Repeat every 1 years  Lung Cancer Screening: (Low Dose CT Chest recommended if Age 43-80 years, 30 pack-year currently smoking OR have quit w/in 15years.) does not qualify.    Lung Cancer Screening Referral: no  Additional Screening:  Hepatitis C Screening: does qualify; due  Vision Screening: Recommended annual ophthalmology exams for early detection of glaucoma and other disorders of the eye. Is the patient up to date with their annual eye exam?  No  Who is the provider or what is the name of the office in which the patient attends annual eye exams? none If pt is not established with a provider, would they like to be referred to a provider to establish care? No .   Dental Screening: Recommended annual dental exams for proper oral hygiene  Community Resource Referral / Chronic Care Management: CRR required this visit?  No   CCM required this visit?  No      Plan:     I have personally reviewed and noted the following in the patient's chart:   Medical and social history Use of alcohol, tobacco or illicit drugs  Current medications and supplements including opioid prescriptions. Patient is not currently taking opioid prescriptions. Functional ability and status Nutritional status Physical activity Advanced directives List of other physicians Hospitalizations, surgeries, and ER visits in previous 12 months Vitals Screenings to include cognitive, depression, and falls Referrals and appointments  In addition, I have reviewed and discussed with patient certain preventive protocols, quality metrics, and best practice recommendations. A written personalized care plan for preventive services as well as general preventive health recommendations were provided to patient.     Kellie Simmering, LPN   06/29/2701   Nurse Notes:

## 2020-10-13 ENCOUNTER — Emergency Department
Admission: EM | Admit: 2020-10-13 | Discharge: 2020-10-14 | Disposition: A | Discharge status: Home / Self Care | Payer: Medicare HMO | Attending: Emergency Medicine | Admitting: Emergency Medicine

## 2020-10-13 ENCOUNTER — Emergency Department: Payer: Medicare HMO

## 2020-10-13 ENCOUNTER — Other Ambulatory Visit: Payer: Self-pay

## 2020-10-13 ENCOUNTER — Encounter: Payer: Self-pay | Admitting: Emergency Medicine

## 2020-10-13 DIAGNOSIS — E119 Type 2 diabetes mellitus without complications: Secondary | ICD-10-CM | POA: Diagnosis not present

## 2020-10-13 DIAGNOSIS — J449 Chronic obstructive pulmonary disease, unspecified: Secondary | ICD-10-CM | POA: Insufficient documentation

## 2020-10-13 DIAGNOSIS — Z20822 Contact with and (suspected) exposure to covid-19: Secondary | ICD-10-CM | POA: Insufficient documentation

## 2020-10-13 DIAGNOSIS — R509 Fever, unspecified: Secondary | ICD-10-CM | POA: Insufficient documentation

## 2020-10-13 DIAGNOSIS — R112 Nausea with vomiting, unspecified: Secondary | ICD-10-CM | POA: Diagnosis not present

## 2020-10-13 DIAGNOSIS — Z87891 Personal history of nicotine dependence: Secondary | ICD-10-CM | POA: Insufficient documentation

## 2020-10-13 DIAGNOSIS — Z79899 Other long term (current) drug therapy: Secondary | ICD-10-CM | POA: Insufficient documentation

## 2020-10-13 DIAGNOSIS — B349 Viral infection, unspecified: Secondary | ICD-10-CM | POA: Diagnosis not present

## 2020-10-13 DIAGNOSIS — I447 Left bundle-branch block, unspecified: Secondary | ICD-10-CM | POA: Diagnosis not present

## 2020-10-13 DIAGNOSIS — R059 Cough, unspecified: Secondary | ICD-10-CM | POA: Diagnosis not present

## 2020-10-13 DIAGNOSIS — R0602 Shortness of breath: Secondary | ICD-10-CM | POA: Diagnosis not present

## 2020-10-13 DIAGNOSIS — R110 Nausea: Secondary | ICD-10-CM | POA: Diagnosis not present

## 2020-10-13 DIAGNOSIS — R0689 Other abnormalities of breathing: Secondary | ICD-10-CM | POA: Diagnosis not present

## 2020-10-13 DIAGNOSIS — Z743 Need for continuous supervision: Secondary | ICD-10-CM | POA: Diagnosis not present

## 2020-10-13 DIAGNOSIS — R197 Diarrhea, unspecified: Secondary | ICD-10-CM | POA: Insufficient documentation

## 2020-10-13 DIAGNOSIS — R1110 Vomiting, unspecified: Secondary | ICD-10-CM | POA: Diagnosis not present

## 2020-10-13 DIAGNOSIS — R079 Chest pain, unspecified: Secondary | ICD-10-CM | POA: Diagnosis not present

## 2020-10-13 DIAGNOSIS — R0789 Other chest pain: Secondary | ICD-10-CM | POA: Diagnosis not present

## 2020-10-13 DIAGNOSIS — Z7984 Long term (current) use of oral hypoglycemic drugs: Secondary | ICD-10-CM | POA: Diagnosis not present

## 2020-10-13 LAB — COMPREHENSIVE METABOLIC PANEL
ALT: 19 U/L (ref 0–44)
AST: 20 U/L (ref 15–41)
Albumin: 4.3 g/dL (ref 3.5–5.0)
Alkaline Phosphatase: 62 U/L (ref 38–126)
Anion gap: 9 (ref 5–15)
BUN: 17 mg/dL (ref 6–20)
CO2: 24 mmol/L (ref 22–32)
Calcium: 9.4 mg/dL (ref 8.9–10.3)
Chloride: 104 mmol/L (ref 98–111)
Creatinine, Ser: 0.97 mg/dL (ref 0.61–1.24)
GFR, Estimated: >60 mL/min (ref >60)
Glucose, Bld: 135 mg/dL — ABNORMAL HIGH (ref 70–99)
Potassium: 3.9 mmol/L (ref 3.5–5.1)
Sodium: 137 mmol/L (ref 135–145)
Total Bilirubin: 0.8 mg/dL (ref 0.3–1.2)
Total Protein: 7.9 g/dL (ref 6.5–8.1)

## 2020-10-13 LAB — CBC WITH DIFFERENTIAL/PLATELET
Abs Immature Granulocytes: 0.09 10*3/uL — ABNORMAL HIGH (ref 0.00–0.07)
Basophils Absolute: 0 10*3/uL (ref 0.0–0.1)
Basophils Relative: 1 %
Eosinophils Absolute: 0.1 10*3/uL (ref 0.0–0.5)
Eosinophils Relative: 1 %
HCT: 40.2 % (ref 39.0–52.0)
Hemoglobin: 13.5 g/dL (ref 13.0–17.0)
Immature Granulocytes: 1 %
Lymphocytes Relative: 25 %
Lymphs Abs: 2.1 10*3/uL (ref 0.7–4.0)
MCH: 29.3 pg (ref 26.0–34.0)
MCHC: 33.6 g/dL (ref 30.0–36.0)
MCV: 87.2 fL (ref 80.0–100.0)
Monocytes Absolute: 0.6 10*3/uL (ref 0.1–1.0)
Monocytes Relative: 7 %
Neutro Abs: 5.5 10*3/uL (ref 1.7–7.7)
Neutrophils Relative %: 65 %
Platelets: 243 10*3/uL (ref 150–400)
RBC: 4.61 MIL/uL (ref 4.22–5.81)
RDW: 13.9 % (ref 11.5–15.5)
WBC: 8.4 10*3/uL (ref 4.0–10.5)
nRBC: 0 % (ref 0.0–0.2)

## 2020-10-13 LAB — LIPASE, BLOOD: Lipase: 31 U/L (ref 11–51)

## 2020-10-13 LAB — RESP PANEL BY RT-PCR (FLU A&B, COVID) ARPGX2
Influenza A by PCR: NEGATIVE
Influenza B by PCR: NEGATIVE
SARS Coronavirus 2 by RT PCR: NEGATIVE

## 2020-10-13 LAB — TROPONIN I (HIGH SENSITIVITY): Troponin I (High Sensitivity): 11 ng/L (ref <18)

## 2020-10-13 MED ORDER — ALUM & MAG HYDROXIDE-SIMETH 200-200-20 MG/5ML PO SUSP
15.0000 mL | Freq: Once | ORAL | Status: AC
  Administered 2020-10-13: 15 mL via ORAL
  Filled 2020-10-13: qty 30

## 2020-10-13 MED ORDER — LACTATED RINGERS IV BOLUS
1000.0000 mL | Freq: Once | INTRAVENOUS | Status: AC
  Administered 2020-10-13: 1000 mL via INTRAVENOUS

## 2020-10-13 MED ORDER — LIDOCAINE VISCOUS HCL 2 % MT SOLN
15.0000 mL | Freq: Once | OROMUCOSAL | Status: AC
  Administered 2020-10-13: 15 mL via ORAL
  Filled 2020-10-13: qty 15

## 2020-10-13 MED ORDER — ONDANSETRON HCL 4 MG/2ML IJ SOLN
4.0000 mg | Freq: Once | INTRAMUSCULAR | Status: AC
  Administered 2020-10-13: 4 mg via INTRAVENOUS
  Filled 2020-10-13: qty 2

## 2020-10-13 NOTE — ED Provider Notes (Signed)
[facility] Emergency Department Provider Note   ____________________________________________   Event Date/Time   First MD Initiated Contact with Patient 10/13/20 2230     (approximate)  I have reviewed the triage vital signs and the nursing notes.   HISTORY  Chief Complaint Chest Pain and Shortness of Breath    HPI Randy [name] is a 50 y.o. male with past medical history of schizophrenia, diabetes, GERD, and COPD who presents to the ED complaining of chest pain.  Patient reports that he developed central pressure in his chest yesterday which has been intermittent since then.  Pain is not exacerbated or alleviated by anything in particular, seem to worsen about 1 hour prior to arrival.  This been associated with productive cough, subjective fevers, chills, and mild shortness of breath.  He additionally endorses nausea with multiple episodes of vomiting and diarrhea.  He states it has been difficult for him to keep anything down, but he denies any pain in his abdomen.  He has not had any sick contacts and states he is fully vaccinated against COVID-19.        Past Medical History:  Diagnosis Date   Allergy    Bipolar 1 disorder ([place])    COPD (chronic obstructive pulmonary disease) (HCC)    GERD (gastroesophageal reflux disease)     Patient Active Problem List   Diagnosis Date Noted   Closed nondisplaced fracture of head of left radius 10/14/2018   OSA (obstructive sleep apnea) 05/20/2018   Morbid obesity ([place]) 11/14/2017   Chronic recurrent major depressive disorder ([place]) 10/14/2017   Schizophrenia ([place]) 10/14/2017   GERD (gastroesophageal reflux disease) 10/14/2017   Centrilobular emphysema ([place]) 10/14/2017   Type 2 diabetes mellitus with other specified complication ([place]) 11/91/4782   Hyperlipidemia associated with type 2 diabetes mellitus ([place]) 10/14/2017    No past surgical history on file.  Prior to Admission medications   Medication  Sig Start Date End Date Taking? Authorizing Provider  albuterol (PROAIR HFA) 108 (90 Base) MCG/ACT inhaler Inhale 1-2 puffs into the lungs every 6 (six) hours as needed for wheezing or shortness of breath. 11/04/19   Karamalegos, Devonne Doughty, DO  ARIPiprazole (ABILIFY) 10 MG tablet Take 10 mg by mouth daily. 05/02/20 06/29/20  [provider]  atorvastatin (LIPITOR) 20 MG tablet Take 1 tablet (20 mg total) by mouth daily. 12/22/19   Karamalegos, Devonne Doughty, DO  cetirizine (ZYRTEC) 10 MG tablet Take 1 tablet (10 mg total) by mouth daily. 11/04/17   Karamalegos, Devonne Doughty, DO  divalproex (DEPAKOTE ER) 500 MG 24 hr tablet Take 1,000 mg by mouth at bedtime. 12/23/19 03/22/20  [provider]  doxepin (SINEQUAN) 10 MG capsule Take 20 mg by mouth at bedtime. 12/23/19   [provider]  FLUoxetine (PROZAC) 20 MG capsule Take 20 mg by mouth daily. 12/23/19 12/22/20  [provider]  metFORMIN (GLUCOPHAGE-XR) 500 MG 24 hr tablet Take 1 tablet (500 mg total) by mouth 2 (two) times daily with a meal. 05/06/20   Karamalegos, Alexander J, DO  OLANZapine (ZYPREXA) 10 MG tablet Take 2.5 tablets (25mg ) every night for 1 week. Then, take 2 tablets (20mg ) every night for 1 week. Then, take 1.5 tablets (15mg ) for 1 week. Then, take 1 tablet (10mg ) every night. 05/02/20   [provider]  omeprazole (PRILOSEC) 20 MG capsule TAKE ONE CAPSULE BY MOUTH DAILY BEFORE BREAKFAST 11/23/19   Olin Hauser, DO    Allergies Patient has no known  allergies.  Family History  Problem Relation Age of Onset   Alcohol abuse Mother    Cancer Father        colon    Colon cancer Father     Social History Social History   Tobacco Use   Smoking status: Former    Packs/day: 0.00    Years: 0.00    Pack years: 0.00    Types: Cigarettes   Smokeless tobacco: Former    Types: Nurse, children's Use: Never used  Substance Use Topics   Alcohol use: No   Drug use: Never     Review of Systems  Constitutional: No fever/chills Eyes: No visual changes. ENT: No sore throat. Cardiovascular: Positive for chest pain. Respiratory: Positive for cough and shortness of breath. Gastrointestinal: No abdominal pain.  Positive for nausea, vomiting, and diarrhea.  No constipation. Genitourinary: Negative for dysuria. Musculoskeletal: Negative for back pain. Skin: Negative for rash. Neurological: Negative for headaches, focal weakness or numbness.  ____________________________________________   PHYSICAL EXAM:  VITAL SIGNS: ED Triage Vitals [10/13/20 2233]  Enc Vitals Group     BP 133/84     Pulse Rate 99     Resp 14     Temp 98.8 F (37.1 C)     Temp Source Oral     SpO2 96 %     Weight      Height      Head Circumference      Peak Flow      Pain Score      Pain Loc      Pain Edu?      Excl. in [place]?     Constitutional: Alert and oriented. Eyes: Conjunctivae are normal. Head: Atraumatic. Nose: No congestion/rhinnorhea. Mouth/Throat: Mucous membranes are moist. Neck: Normal ROM Cardiovascular: Normal rate, regular rhythm. Grossly normal heart sounds.  2+ radial pulses bilaterally. Respiratory: Normal respiratory effort.  No retractions. Lungs CTAB.  No chest wall tenderness to palpation. Gastrointestinal: Soft and nontender. No distention. Genitourinary: deferred Musculoskeletal: No lower extremity tenderness nor edema. Neurologic:  Normal speech and language. No gross focal neurologic deficits are appreciated. Skin:  Skin is warm, dry and intact. No rash noted. Psychiatric: Mood and affect are normal. Speech and behavior are normal.  ____________________________________________   LABS (all labs ordered are listed, but only abnormal results are displayed)  Labs Reviewed  RESP PANEL BY RT-PCR (FLU A&B, COVID) [id number]  CBC WITH DIFFERENTIAL/PLATELET  COMPREHENSIVE METABOLIC PANEL  LIPASE, BLOOD  VALPROIC ACID LEVEL  LITHIUM LEVEL  TROPONIN  I (HIGH SENSITIVITY)   ____________________________________________  EKG  ED ECG REPORT I, Blake Divine, the attending physician, personally viewed and interpreted this ECG.   Date: 10/13/2020  EKG Time: 22:29  Rate: 103  Rhythm: sinus tachycardia  Axis: Normal  Intervals:left bundle branch block  ST&T Change: None, negative sgarbossa   PROCEDURES  Procedure(s) performed (including Critical Care):  Procedures   ____________________________________________   INITIAL IMPRESSION / ASSESSMENT AND PLAN / ED COURSE      50 year old male with past medical history of schizophrenia, diabetes, GERD, and COPD who presents to the ED with worsening pressure in the center of his chest, present intermittently since yesterday and associated with vomiting, diarrhea, cough, and mild shortness of breath.  EMS had transmitted EKG showing left bundle branch block but there are no changes to suggest ACS.  EKG on arrival here in the ED is similar, we will check 2 sets of troponin but  overall low suspicion for ACS.  Symptoms seem more consistent with COVID-19 versus esophagitis related to multiple episodes of vomiting.  Low suspicion for PE given lack of risk factors and reassuring vital signs.  We will further assess with labs and chest x-ray, treat symptomatically with IV fluids and Zofran along with GI cocktail.  His wife recently tested positive for COVID-19, but he states he no longer lives with her.  Patient turned over to oncoming provider pending lab results and reassessment.      ____________________________________________   FINAL CLINICAL IMPRESSION(S) / ED DIAGNOSES  Final diagnoses:  Atypical chest pain  Non-intractable vomiting with nausea, unspecified vomiting type     ED Discharge Orders     None        Note:  This document was prepared using Dragon voice recognition software and may include unintentional dictation errors.    Blake Divine, MD 10/13/20  2253

## 2020-10-13 NOTE — ED Triage Notes (Signed)
Pt came to ED via [facility] with complaints  that he developed chest pain, with SOB that began yesterday. He then developed some N/V.

## 2020-10-13 NOTE — ED Provider Notes (Signed)
-----------------------------------------   11:04 PM on 10/13/2020 -----------------------------------------  Assuming care from Dr. Charna Archer.  In short, Randy [name] is a 50 y.o. male with a chief complaint of chest pain and viral symptoms.  Refer to the original H&P for additional details.  The current plan of care is to follow-up on labs.  Given stable vital signs, anticipate discharge unless evidence of an emergent medical condition is uncovered.   ----------------------------------------- 2:25 AM on 10/14/2020 -----------------------------------------  Patient's work-up has been reassuring.  He is subtherapeutic on valproic acid and lithium levels, I suspect noncompliance is an issue. Two high-sensitivity troponins have been reassuring.  Normal lipase, negative respiratory viral panel, normal comprehensive metabolic panel, normal CBC.  I personally reviewed the patient's imaging and agree with the radiologist's interpretation that there is no acute abnormality on chest x-ray.  Talked with the patient and he is comfortable with the reassuring work-up and the plan for discharge.  I provided him with a prescription for Tessalon Perles for his mild cough and for Zofran given the nausea and vomiting.  I gave my usual and customary return precautions.   Final diagnoses:  Atypical chest pain  Non-intractable vomiting with nausea, unspecified vomiting type  Acute viral syndrome       Hinda Kehr, MD 10/14/20 [phone]

## 2020-10-14 LAB — VALPROIC ACID LEVEL: Valproic Acid Lvl: 42 ug/mL — ABNORMAL LOW (ref 50.0–100.0)

## 2020-10-14 LAB — LITHIUM LEVEL: Lithium Lvl: <0.06 mmol/L — ABNORMAL LOW (ref 0.60–1.20)

## 2020-10-14 LAB — TROPONIN I (HIGH SENSITIVITY): Troponin I (High Sensitivity): 10 ng/L (ref <18)

## 2020-10-14 MED ORDER — ONDANSETRON 4 MG PO TBDP
ORAL_TABLET | ORAL | 0 refills | Status: DC
Start: 2020-10-14 — End: 2021-06-07

## 2020-10-14 MED ORDER — BENZONATATE 100 MG PO CAPS: 100.0000 mg | ORAL_CAPSULE | Freq: Three times a day (TID) | ORAL | 0 refills | Status: DC | PRN

## 2020-10-14 NOTE — Discharge Instructions (Addendum)

## 2020-10-24 ENCOUNTER — Telehealth: Admit: 2020-10-24 | Discharge: 2020-10-25 | Payer: MEDICARE

## 2020-10-24 DIAGNOSIS — G4700 Insomnia, unspecified: Secondary | ICD-10-CM | POA: Diagnosis not present

## 2020-10-24 DIAGNOSIS — R69 Illness, unspecified: Secondary | ICD-10-CM | POA: Diagnosis not present

## 2020-11-04 DIAGNOSIS — F209 Schizophrenia, unspecified: Principal | ICD-10-CM

## 2020-11-08 ENCOUNTER — Other Ambulatory Visit: Payer: Self-pay

## 2020-11-08 ENCOUNTER — Ambulatory Visit (INDEPENDENT_AMBULATORY_CARE_PROVIDER_SITE_OTHER): Payer: Medicare HMO | Admitting: Family Medicine

## 2020-11-08 ENCOUNTER — Encounter: Payer: Self-pay | Admitting: Family Medicine

## 2020-11-08 VITALS — BP 111/66 | HR 71 | Ht 71.0 in | Wt 238.4 lb

## 2020-11-08 DIAGNOSIS — Z125 Encounter for screening for malignant neoplasm of prostate: Secondary | ICD-10-CM

## 2020-11-08 DIAGNOSIS — Z0000 Encounter for general adult medical examination without abnormal findings: Secondary | ICD-10-CM | POA: Diagnosis not present

## 2020-11-08 DIAGNOSIS — K921 Melena: Secondary | ICD-10-CM

## 2020-11-08 DIAGNOSIS — F200 Paranoid schizophrenia: Secondary | ICD-10-CM

## 2020-11-08 DIAGNOSIS — K635 Polyp of colon: Secondary | ICD-10-CM | POA: Diagnosis not present

## 2020-11-08 DIAGNOSIS — E782 Mixed hyperlipidemia: Secondary | ICD-10-CM

## 2020-11-08 DIAGNOSIS — J432 Centrilobular emphysema: Secondary | ICD-10-CM | POA: Diagnosis not present

## 2020-11-08 DIAGNOSIS — E785 Hyperlipidemia, unspecified: Secondary | ICD-10-CM

## 2020-11-08 DIAGNOSIS — E1169 Type 2 diabetes mellitus with other specified complication: Secondary | ICD-10-CM

## 2020-11-08 DIAGNOSIS — G4733 Obstructive sleep apnea (adult) (pediatric): Secondary | ICD-10-CM

## 2020-11-08 DIAGNOSIS — R69 Illness, unspecified: Secondary | ICD-10-CM | POA: Diagnosis not present

## 2020-11-08 MED ORDER — ATORVASTATIN CALCIUM 20 MG PO TABS: 20.0000 mg | ORAL_TABLET | Freq: Every day | ORAL | 3 refills | Status: DC

## 2020-11-08 MED ORDER — METFORMIN HCL ER 500 MG PO TB24: 500.0000 mg | ORAL_TABLET | Freq: Two times a day (BID) | ORAL | 3 refills | Status: DC

## 2020-11-08 MED ORDER — ALBUTEROL SULFATE HFA 108 (90 BASE) MCG/ACT IN AERS: 1.0000 | INHALATION_SPRAY | Freq: Four times a day (QID) | RESPIRATORY_TRACT | 3 refills | Status: AC | PRN

## 2020-11-08 NOTE — Progress Notes (Signed)
Subjective:    Patient ID: Randy [name], male    DOB: [birth year], 50 y.o.   MRN: [id number]  Randy [name] is a 50 y.o. male presenting on 11/08/2020 for Annual Exam   HPI Schizophrenia / Major Depression recurrent Followed by [facility] for specialized Schizophrenia patients, chart reviewed from last visit, documentation shows patient has history of fetal alcohol syndrome, recurrent major depression and schizophrenia.  - Today he is reporting overall doing well, there was some discussion about adjusting his medications recently based on chart review. On Abilify 26m (144mx 2), Depakote ER 50050m 2 = 1000m5mghtly, Doxepin 20mg38mhtly, Fluoxetine 20mg 17my   Type 2 Diabetes / Morbid Obesity BMI   anti-psychotic/schizophrenia medication causing hyperglycemia Meds:Metformin XR 500mg B35mdmits some GI intolerance Lifestyle: Improved diet - Exercise (Walking regularly for activity and exercise) [facility], need upcoming report. Denies hypoglycemia, polyuria, visual changes, numbness or tingling   History of cancer He has reduced tobacco / chew  Depression screen PHQ 2/9[facility]17/2022 09/27/2020 05/06/2020  Decreased Interest 0 0 0  Down, Depressed, Hopeless 1 0 2  PHQ - 2 Score 1 0 2  Altered sleeping 0 - 0  Tired, decreased energy 1 - 1  Change in appetite 0 - 2  Feeling bad or failure about yourself  0 - 0  Trouble concentrating 0 - 0  Moving slowly or fidgety/restless 1 - 2  Suicidal thoughts 0 - 0  PHQ-9 Score 3 - 7  Difficult doing work/chores Somewhat difficult - Somewhat difficult    Past Medical History:  Diagnosis Date   Allergy    Bipolar 1 disorder (HCC)    COPD (chronic obstructive pulmonary disease) (HCC)    GERD (gastroesophageal reflux disease)    History reviewed. No pertinent surgical history. Social History   Socioeconomic History   Marital status: Married    Spouse name: Not on file   Number of children: Not on file   Years of education:  Not on file   Highest education level: 11th grade  Occupational History   Occupation: disability   Tobacco Use   Smoking status: Former    Packs/day: 0.00    Years: 0.00    Pack years: 0.00    Types: Cigarettes   Smokeless tobacco: Former    Types: Chew  VNurse, children'sever used  Substance and Sexual Activity   Alcohol use: No   Drug use: Never   Sexual activity: Yes  Other Topics Concern   Not on file  Social History Narrative   Ongoing financial constraints mentioned during phone call today   Social Determinants of Health   Financial Resource Strain: Low Risk    Difficulty of Paying Living Expenses: Not hard at all  Food Insecurity: No Food Insecurity   Worried About RunningCharity fundraiser Last Year: Never true   Ran OutMerino Last Year: Never true  Transportation Needs: No Transportation Needs   Lack of Transportation (Medical): No   Lack of Transportation (Non-Medical): No  Physical Activity: Insufficiently Active   Days of Exercise per Week: 3 days   Minutes of Exercise per Session: 40 min  Stress: No Stress Concern Present   Feeling of Stress : Not at all  Social Connections: Not on file  Intimate Partner Violence: Not on file   Family History  Problem Relation Age of Onset   Alcohol abuse Mother  Cancer Father        colon    Colon cancer Father    Current Outpatient Medications on File Prior to Visit  Medication Sig   cetirizine (ZYRTEC) 10 MG tablet Take 1 tablet (10 mg total) by mouth daily.   doxepin (SINEQUAN) 10 MG capsule Take 20 mg by mouth at bedtime.   FLUoxetine (PROZAC) 20 MG capsule Take 1 capsule by mouth daily.   ondansetron (ZOFRAN ODT) 4 MG disintegrating tablet Allow 1-2 tablets to dissolve in your mouth every 8 hours as needed for nausea/vomiting   ARIPiprazole (ABILIFY) 10 MG tablet Take 20 mg by mouth daily.   divalproex (DEPAKOTE ER) 500 MG 24 hr tablet Take 1,000 mg by mouth at bedtime.   No current  facility-administered medications on file prior to visit.    Review of Systems Per HPI unless specifically indicated above      Objective:    BP 111/66   Pulse 71   Ht _0  (1.803 m)   Wt 238 lb 6.4 oz (108.1 kg)   SpO2 95%   BMI 33.25 kg/m   Wt Readings from Last 3 Encounters:  11/08/20 238 lb 6.4 oz (108.1 kg)  10/13/20 265 lb (120.2 kg)  09/27/20 265 lb (120.2 kg)    Physical Exam  Colonoscopy  Narrative  _______________________________________________________________________________  Patient Name: Randy [name]            Procedure Date: 04/12/2017 1:25 PM  MRN: [id number]                     Date of Birth: [birth year]  Admit Type: Outpatient                Age: 85  Room: [facility] OR [facility]          Gender: Male  Note Status: Finalized                Instrument Name: [id number]  _______________________________________________________________________________   Procedure:         Colonoscopy  Indications:       Screening in patient at increased risk: Family history of                     1st-degree relative with colorectal cancer before age 17                     years  Providers:         JAMA Burnis Medin, MD, Donell Sievert, [place] Police  Referring MD:        Medicines:         Propofol per Anesthesia  Complications:     No immediate complications.  _______________________________________________________________________________  Procedure:         Pre-Anesthesia Assessment:                     - Prior to the procedure, a History and Physical was                     performed, and patient medications and allergies were                     reviewed. The patient's tolerance of previous anesthesia                     was also reviewed. The risks and benefits of the procedure  and the sedation options and risks were discussed with the                     patient. All questions were answered, and informed consent                      was obtained. Prior Anticoagulants: The patient has taken                     no previous anticoagulant or antiplatelet agents. ASA                     Grade Assessment: III - A patient with severe systemic                     disease. After reviewing the risks and benefits, the                     patient was deemed in satisfactory condition to undergo                     the procedure.                     After obtaining informed consent, the scope was passed                     under direct vision. Throughout the procedure, the                     patient's blood pressure, pulse, and oxygen saturations                     were monitored continuously.The terminal ileum, ileocecal                     valve, appendiceal orifice, and rectum were photographed.                     The quality of the bowel preparation was adequate to                     identify polyps. The Colonoscope was introduced through                     the anus and advanced to the the terminal ileum, with                     identification of the appendiceal orifice and IC valve.                     The colonoscopy was performed without difficulty. The                     patient tolerated the procedure well. The quality of the                     bowel preparation was adequate to identify polyps.  Findings:       The terminal ileum appeared normal.       Seven sessile polyps were found in the sigmoid colon and transverse       colon. The polyps were 2 to 5 mm in size. These polyps were removed with       a cold biopsy forceps. Resection and retrieval were complete.       Four pedunculated and semi-pedunculated polyps were found in the       recto-sigmoid colon and descending colon. The polyps were 4 to 9 mm in        size. These polyps were removed with a hot snare. Resection and       retrieval were complete.       A 5 mm polyp was  found in the rectum. The polyp was semi-pedunculated.       The polyp was removed with a cold snare. Resection and retrieval were       complete.       Non-bleeding internal hemorrhoids were found during retroflexion. The       hemorrhoids were small.                                                                                  Impression:        - The examined portion of the ileum was normal.                     - Seven 2 to 5 mm polyps in the sigmoid colon and in the                     transverse colon, removed with a cold biopsy forceps.                     Resected and retrieved.                     - Four 4 to 9 mm polyps at the recto-sigmoid colon and in                     the descending colon, removed with a hot snare. Resected                     and retrieved.                     - One 5 mm polyp in the rectum, removed with a cold snare.                     Resected and retrieved.                     - Non-bleeding internal hemorrhoids.  Recommendation:    - Patient has a contact number available for emergencies.                     The signs and symptoms of potential delayed complications  were discussed with the patient. Return to normal                     activities tomorrow. Written discharge instructions were                     provided to the patient.                     - Resume previous diet.                     - Continue present medications.                     - Await pathology results.                     - Repeat colonoscopy in 1 year for surveillance based on                     number of polyps.                     - Return to GI office as previously scheduled.                                                                                  Procedure Code(s): --- Professional ---                     [phone], Colonoscopy, flexible; with removal of tumor(s),                     polyp(s), or other lesion(s) by snare technique                      45380, 74, Colonoscopy, flexible; with biopsy, single or                     multiple  Diagnosis Code(s): --- Professional ---                     Z80.0, Family history of malignant neoplasm of digestive                     organs                     K64.8, Other hemorrhoids                     D12.5, Benign neoplasm of sigmoid colon                     D12.3, Benign neoplasm of transverse colon (hepatic                     flexure or splenic flexure)                     D12.7, Benign neoplasm of rectosigmoid junction  D12.4, Benign neoplasm of descending colon                     K62.1, Rectal polyp   CPT copyright 2017 American Medical Association. All rights reserved.   The codes documented in this report are preliminary and upon coder review may  be revised to meet current compliance requirements.   Electronically Signed by Rayvon Char, MD  _________________________  JAMA Burnis Medin, MD  04/12/2017 3:58:01 PM  The attending physician was present throughout the entire procedure including  insertion, viewing, and removal.  Number of Addenda: 0   Note Initiated On: 04/12/2017 1:25 PM   Results for orders placed or performed in visit on 11/08/20  COMPLETE METABOLIC PANEL WITH GFR  Result Value Ref Range   Glucose, Bld 82 65 - 99 mg/dL   BUN 15 7 - 25 mg/dL   Creat 0.94 0.70 - 1.30 mg/dL   eGFR 98 > OR = 60 mL/min/1.92m   BUN/Creatinine Ratio NOT APPLICABLE 6 - 22 (calc)   Sodium 141 135 - 146 mmol/L   Potassium 4.9 3.5 - 5.3 mmol/L   Chloride 103 98 - 110 mmol/L   CO2 29 20 - 32 mmol/L   Calcium 10.5 (H) 8.6 - 10.3 mg/dL   Total Protein 7.8 6.1 - 8.1 g/dL   Albumin 4.9 3.6 - 5.1 g/dL   Globulin 2.9 1.9 - 3.7 g/dL (calc)   AG Ratio 1.7 1.0 - 2.5 (calc)   Total Bilirubin 0.4 0.2 - 1.2 mg/dL   Alkaline phosphatase (APISO) 57 35 - 144 U/L   AST 14 10 - 35 U/L   ALT 20 9 - 46 U/L  CBC with Differential/Platelet  Result Value Ref Range   WBC  4.8 3.8 - 10.8 Thousand/uL   RBC 4.71 4.20 - 5.80 Million/uL   Hemoglobin 14.0 13.2 - 17.1 g/dL   HCT 42.2 38.5 - 50.0 %   MCV 89.6 80.0 - 100.0 fL   MCH 29.7 27.0 - 33.0 pg   MCHC 33.2 32.0 - 36.0 g/dL   RDW 14.2 11.0 - 15.0 %   Platelets 202 140 - 400 Thousand/uL   MPV 12.2 7.5 - 12.5 fL   Neutro Abs 2,376 1,500 - 7,800 cells/uL   Lymphs Abs 1,930 850 - 3,900 cells/uL   Absolute Monocytes 408 200 - 950 cells/uL   Eosinophils Absolute 58 15 - 500 cells/uL   Basophils Absolute 29 0 - 200 cells/uL   Neutrophils Relative % 49.5 %   Total Lymphocyte 40.2 %   Monocytes Relative 8.5 %   Eosinophils Relative 1.2 %   Basophils Relative 0.6 %  Lipid panel  Result Value Ref Range   Cholesterol 135 <200 mg/dL   HDL 54 > OR = 40 mg/dL   Triglycerides 231 (H) <150 mg/dL   LDL Cholesterol (Calc) 52 mg/dL (calc)   Total CHOL/HDL Ratio 2.5 <5.0 (calc)   Non-HDL Cholesterol (Calc) 81 <130 mg/dL (calc)  Hemoglobin A1c  Result Value Ref Range   Hgb A1c MFr Bld 5.8 (H) <5.7 % of total Hgb   Mean Plasma Glucose 120 mg/dL   eAG (mmol/L) 6.6 mmol/L  PSA  Result Value Ref Range   PSA 0.73 < OR = 4.00 ng/mL      Assessment & Plan:   Problem List Items Addressed This Visit     Type 2 diabetes mellitus with other specified complication (HCC)   Relevant Medications   metFORMIN (GLUCOPHAGE-XR) 500 MG 24  hr tablet   atorvastatin (LIPITOR) 20 MG tablet   Other Relevant Orders   Hemoglobin A1c (Completed)   Schizophrenia (HCC)   OSA (obstructive sleep apnea)   Morbid obesity (HCC)   Relevant Medications   metFORMIN (GLUCOPHAGE-XR) 500 MG 24 hr tablet   Other Relevant Orders   COMPLETE METABOLIC PANEL WITH GFR (Completed)   CBC with Differential/Platelet (Completed)   Lipid panel (Completed)   Hyperlipidemia associated with type 2 diabetes mellitus (HCC)   Relevant Medications   metFORMIN (GLUCOPHAGE-XR) 500 MG 24 hr tablet   atorvastatin (LIPITOR) 20 MG tablet   Other Relevant Orders    Lipid panel (Completed)   Centrilobular emphysema (HCC)   Relevant Medications   albuterol (PROAIR HFA) 108 (90 Base) MCG/ACT inhaler   Other Relevant Orders   CBC with Differential/Platelet (Completed)   Other Visit Diagnoses     Annual physical exam    -  Primary   Relevant Orders   COMPLETE METABOLIC PANEL WITH GFR (Completed)   CBC with Differential/Platelet (Completed)   Lipid panel (Completed)   Hemoglobin A1c (Completed)   Polyp of colon, unspecified part of colon, unspecified type       Relevant Orders   Ambulatory referral to Gastroenterology   Melena       Relevant Orders   Ambulatory referral to Gastroenterology   CBC with Differential/Platelet (Completed)   Screening for prostate cancer       Relevant Orders   PSA (Completed)   Mixed hyperlipidemia       Relevant Medications   atorvastatin (LIPITOR) 20 MG tablet       Updated Health Maintenance information Referral to GI for Colonoscopy - [facility] requested, has melena, overdue from prior history of colonoscopy w/ polyps Reviewed recent lab results with patient Encouraged improvement to lifestyle with diet and exercise Goal of weight loss  Refill medications   Meds ordered this encounter  Medications   albuterol (PROAIR HFA) 108 (90 Base) MCG/ACT inhaler    Sig: Inhale 1-2 puffs into the lungs every 6 (six) hours as needed for wheezing or shortness of breath.    Dispense:  1 g    Refill:  3   metFORMIN (GLUCOPHAGE-XR) 500 MG 24 hr tablet    Sig: Take 1 tablet (500 mg total) by mouth 2 (two) times daily with a meal.    Dispense:  180 tablet    Refill:  3   atorvastatin (LIPITOR) 20 MG tablet    Sig: Take 1 tablet (20 mg total) by mouth daily.    Dispense:  90 tablet    Refill:  3      Follow up plan: Return in about 6 months (around 05/11/2021) for 6 month follow-up DM A1c.  [name], [facility] 11/08/2020, 8:54 AM

## 2020-11-08 NOTE — Patient Instructions (Addendum)
Thank you for coming to the office today.  Referral sent to [facility] for GI consult and Colonoscopy. If you do not hear back within 2 weeks, please call us back or notify us to check on status.  Updated medicines.  Please have [facility] send [place] a copy of the report.  Labs today,. Stay tuned for results.  Please schedule a Follow-up Appointment to: Return in about 6 months (around 05/11/2021) for 6 month follow-up DM A1c.  If you have any other questions or concerns, please feel free to call the office or send a message through [place]. You may also schedule an earlier appointment if necessary.  Additionally, you may be receiving a survey about your experience at our office within a few days to 1 week by e-mail or mail. We value your feedback.  [name], DO [place]

## 2020-11-09 DIAGNOSIS — Z1211 Encounter for screening for malignant neoplasm of colon: Principal | ICD-10-CM

## 2020-11-09 DIAGNOSIS — K921 Melena: Principal | ICD-10-CM

## 2020-11-09 DIAGNOSIS — Z86010 Personal history of colonic polyps: Principal | ICD-10-CM

## 2020-11-09 LAB — HEMOGLOBIN A1C
Hgb A1c MFr Bld: 5.8 % of total Hgb — ABNORMAL HIGH (ref <5.7)
Mean Plasma Glucose: 120 mg/dL
eAG (mmol/L): 6.6 mmol/L

## 2020-11-09 LAB — CBC WITH DIFFERENTIAL/PLATELET
Absolute Monocytes: 408 cells/uL (ref 200–950)
Basophils Absolute: 29 cells/uL (ref 0–200)
Basophils Relative: 0.6 %
Eosinophils Absolute: 58 cells/uL (ref 15–500)
Eosinophils Relative: 1.2 %
HCT: 42.2 % (ref 38.5–50.0)
Hemoglobin: 14 g/dL (ref 13.2–17.1)
Lymphs Abs: 1930 cells/uL (ref 850–3900)
MCH: 29.7 pg (ref 27.0–33.0)
MCHC: 33.2 g/dL (ref 32.0–36.0)
MCV: 89.6 fL (ref 80.0–100.0)
MPV: 12.2 fL (ref 7.5–12.5)
Monocytes Relative: 8.5 %
Neutro Abs: 2376 cells/uL (ref 1500–7800)
Neutrophils Relative %: 49.5 %
Platelets: 202 10*3/uL (ref 140–400)
RBC: 4.71 10*6/uL (ref 4.20–5.80)
RDW: 14.2 % (ref 11.0–15.0)
Total Lymphocyte: 40.2 %
WBC: 4.8 10*3/uL (ref 3.8–10.8)

## 2020-11-09 LAB — COMPLETE METABOLIC PANEL WITH GFR
AG Ratio: 1.7 (calc) (ref 1.0–2.5)
ALT: 20 U/L (ref 9–46)
AST: 14 U/L (ref 10–35)
Albumin: 4.9 g/dL (ref 3.6–5.1)
Alkaline phosphatase (APISO): 57 U/L (ref 35–144)
BUN: 15 mg/dL (ref 7–25)
CO2: 29 mmol/L (ref 20–32)
Calcium: 10.5 mg/dL — ABNORMAL HIGH (ref 8.6–10.3)
Chloride: 103 mmol/L (ref 98–110)
Creat: 0.94 mg/dL (ref 0.70–1.30)
Globulin: 2.9 g/dL (calc) (ref 1.9–3.7)
Glucose, Bld: 82 mg/dL (ref 65–99)
Potassium: 4.9 mmol/L (ref 3.5–5.3)
Sodium: 141 mmol/L (ref 135–146)
Total Bilirubin: 0.4 mg/dL (ref 0.2–1.2)
Total Protein: 7.8 g/dL (ref 6.1–8.1)
eGFR: 98 mL/min/{1.73_m2} (ref >=60)

## 2020-11-09 LAB — LIPID PANEL
Cholesterol: 135 mg/dL (ref <200)
HDL: 54 mg/dL (ref >=40)
LDL Cholesterol (Calc): 52 mg/dL (calc)
Non-HDL Cholesterol (Calc): 81 mg/dL (calc) (ref <130)
Total CHOL/HDL Ratio: 2.5 (calc) (ref <5.0)
Triglycerides: 231 mg/dL — ABNORMAL HIGH (ref <150)

## 2020-11-09 LAB — PSA: PSA: 0.73 ng/mL (ref <=4.00)

## 2020-12-05 ENCOUNTER — Ambulatory Visit: Admit: 2020-12-05 | Discharge: 2020-12-06 | Payer: MEDICARE

## 2020-12-05 DIAGNOSIS — Z20822 Contact with and (suspected) exposure to covid-19: Secondary | ICD-10-CM | POA: Diagnosis not present

## 2020-12-05 DIAGNOSIS — F209 Schizophrenia, unspecified: Principal | ICD-10-CM

## 2020-12-05 MED ORDER — FLUOXETINE 10 MG CAPSULE
ORAL_CAPSULE | Freq: Every day | ORAL | 0 refills | 30 days | Status: CP
Start: 2020-12-05 — End: 2021-01-04

## 2020-12-05 MED ORDER — FLUOXETINE 20 MG CAPSULE
ORAL_CAPSULE | Freq: Every day | ORAL | 2 refills | 30 days | Status: CP
Start: 2020-12-05 — End: 2021-03-05

## 2020-12-07 MED ORDER — DIVALPROEX 500 MG TABLET,DELAYED RELEASE
ORAL_TABLET | 0 refills | 0 days
Start: 2020-12-07 — End: ?

## 2020-12-10 MED ORDER — DIVALPROEX 500 MG TABLET,DELAYED RELEASE
ORAL_TABLET | 0 refills | 0 days
Start: 2020-12-10 — End: ?

## 2020-12-10 MED ORDER — DIVALPROEX ER 500 MG TABLET,EXTENDED RELEASE 24 HR
ORAL_TABLET | Freq: Every evening | ORAL | 1 refills | 90 days | Status: CP
Start: 2020-12-10 — End: 2021-06-08

## 2020-12-11 MED ORDER — DIVALPROEX 500 MG TABLET,DELAYED RELEASE
ORAL_TABLET | 0 refills | 0 days | Status: CP
Start: 2020-12-11 — End: ?

## 2020-12-19 MED ORDER — FLUOXETINE 40 MG CAPSULE
ORAL_CAPSULE | Freq: Every day | ORAL | 2 refills | 30 days | Status: CP
Start: 2020-12-19 — End: 2021-03-19

## 2021-01-08 MED ORDER — FLUOXETINE 10 MG CAPSULE
ORAL_CAPSULE | 0 refills | 0 days
Start: 2021-01-08 — End: ?

## 2021-01-09 MED ORDER — FLUOXETINE 10 MG CAPSULE
ORAL_CAPSULE | 0 refills | 0 days | Status: CP
Start: 2021-01-09 — End: ?

## 2021-02-08 MED ORDER — FLUOXETINE 10 MG CAPSULE
ORAL_CAPSULE | 0 refills | 0 days | Status: CP
Start: 2021-02-08 — End: ?

## 2021-02-20 ENCOUNTER — Telehealth: Admit: 2021-02-20 | Discharge: 2021-02-21 | Payer: MEDICARE

## 2021-03-25 MED ORDER — FLUOXETINE 10 MG CAPSULE
ORAL_CAPSULE | 0 refills | 0 days
Start: 2021-03-25 — End: ?

## 2021-03-28 MED ORDER — FLUOXETINE 10 MG CAPSULE
ORAL_CAPSULE | 0 refills | 0 days | Status: CP
Start: 2021-03-28 — End: ?

## 2021-04-17 ENCOUNTER — Ambulatory Visit: Admit: 2021-04-17 | Payer: MEDICARE

## 2021-04-17 MED ORDER — DIVALPROEX 500 MG TABLET,DELAYED RELEASE
ORAL_TABLET | 0 refills | 0.00000 days
Start: 2021-04-17 — End: ?

## 2021-04-18 MED ORDER — DIVALPROEX ER 500 MG TABLET,EXTENDED RELEASE 24 HR
ORAL_TABLET | Freq: Every evening | ORAL | 1 refills | 90 days | Status: CP
Start: 2021-04-18 — End: 2021-10-15

## 2021-04-18 MED ORDER — FLUOXETINE 40 MG CAPSULE
ORAL_CAPSULE | Freq: Every day | ORAL | 2 refills | 30 days | Status: CP
Start: 2021-04-18 — End: 2021-07-17

## 2021-04-18 MED ORDER — DIVALPROEX 500 MG TABLET,DELAYED RELEASE
ORAL_TABLET | 0 refills | 0.00000 days
Start: 2021-04-18 — End: ?

## 2021-04-18 MED ORDER — ARIPIPRAZOLE 10 MG TABLET
ORAL_TABLET | Freq: Every day | ORAL | 3 refills | 60 days | Status: CP
Start: 2021-04-18 — End: 2021-05-18

## 2021-04-25 MED ORDER — DIVALPROEX 500 MG TABLET,DELAYED RELEASE
ORAL_TABLET | 0 refills | 0 days
Start: 2021-04-25 — End: ?

## 2021-04-26 MED ORDER — DIVALPROEX 500 MG TABLET,DELAYED RELEASE
ORAL_TABLET | 0 refills | 0 days
Start: 2021-04-26 — End: ?

## 2021-05-01 ENCOUNTER — Emergency Department: Payer: Medicare HMO

## 2021-05-01 ENCOUNTER — Other Ambulatory Visit: Payer: Self-pay

## 2021-05-01 ENCOUNTER — Emergency Department
Admission: EM | Admit: 2021-05-01 | Discharge: 2021-05-01 | Disposition: A | Discharge status: Home / Self Care | Payer: Medicare HMO | Attending: Emergency Medicine | Admitting: Emergency Medicine

## 2021-05-01 DIAGNOSIS — N4889 Other specified disorders of penis: Secondary | ICD-10-CM | POA: Diagnosis not present

## 2021-05-01 DIAGNOSIS — E119 Type 2 diabetes mellitus without complications: Secondary | ICD-10-CM | POA: Diagnosis not present

## 2021-05-01 DIAGNOSIS — J449 Chronic obstructive pulmonary disease, unspecified: Secondary | ICD-10-CM | POA: Diagnosis not present

## 2021-05-01 DIAGNOSIS — R079 Chest pain, unspecified: Secondary | ICD-10-CM | POA: Diagnosis not present

## 2021-05-01 DIAGNOSIS — B3749 Other urogenital candidiasis: Secondary | ICD-10-CM | POA: Diagnosis not present

## 2021-05-01 DIAGNOSIS — R0789 Other chest pain: Secondary | ICD-10-CM | POA: Diagnosis not present

## 2021-05-01 DIAGNOSIS — B3731 Acute candidiasis of vulva and vagina: Secondary | ICD-10-CM | POA: Diagnosis not present

## 2021-05-01 LAB — CBC
HCT: 40 % (ref 39.0–52.0)
Hemoglobin: 13.3 g/dL (ref 13.0–17.0)
MCH: 30 pg (ref 26.0–34.0)
MCHC: 33.3 g/dL (ref 30.0–36.0)
MCV: 90.3 fL (ref 80.0–100.0)
Platelets: 191 10*3/uL (ref 150–400)
RBC: 4.43 MIL/uL (ref 4.22–5.81)
RDW: 13.7 % (ref 11.5–15.5)
WBC: 7.7 10*3/uL (ref 4.0–10.5)
nRBC: 0 % (ref 0.0–0.2)

## 2021-05-01 LAB — BASIC METABOLIC PANEL
Anion gap: 9 (ref 5–15)
BUN: 13 mg/dL (ref 6–20)
CO2: 26 mmol/L (ref 22–32)
Calcium: 9.7 mg/dL (ref 8.9–10.3)
Chloride: 104 mmol/L (ref 98–111)
Creatinine, Ser: 1.08 mg/dL (ref 0.61–1.24)
GFR, Estimated: >60 mL/min (ref >60)
Glucose, Bld: 184 mg/dL — ABNORMAL HIGH (ref 70–99)
Potassium: 4 mmol/L (ref 3.5–5.1)
Sodium: 139 mmol/L (ref 135–145)

## 2021-05-01 LAB — TROPONIN I (HIGH SENSITIVITY): Troponin I (High Sensitivity): 11 ng/L (ref <18)

## 2021-05-01 MED ORDER — NYSTATIN 100000 UNIT/GM EX POWD: 1.0000 "application " | Freq: Three times a day (TID) | CUTANEOUS | 0 refills | Status: AC

## 2021-05-01 MED ORDER — OMEPRAZOLE MAGNESIUM 20 MG PO TBEC
20.0000 mg | DELAYED_RELEASE_TABLET | Freq: Every day | ORAL | 0 refills | Status: DC
Start: 2021-05-01 — End: 2021-06-07

## 2021-05-01 NOTE — ED Provider Notes (Signed)
[facility] Provider Note    Event Date/Time   First MD Initiated Contact with Patient 05/01/21 2153     (approximate)   History   Chest Pain and Groin Swelling   HPI  Randy [name] is a 51 y.o. male with a history of schizophrenia, GERD, COPD, diabetes, obesity who comes ED complaining of central chest soreness that hurts with movement and feels better laying on his right side for the past 3 days, redness itching and swelling around the groin for the past 3 days, as well as feeling like he sometimes has blood in his stool for the past week.  No exertional or pleuritic symptoms.  No diaphoresis or vomiting.  No hematemesis.  No fever.  Reports that he supposed to get a colonoscopy annually, but has not been in several years due to the pandemic.  No dysuria.  No abdominal pain.    Physical Exam   Triage Vital Signs: ED Triage Vitals  Enc Vitals Group     BP 05/01/21 2104 (!) 146/85     Pulse Rate 05/01/21 2104 88     Resp 05/01/21 2104 20     Temp 05/01/21 2104 98.4 F (36.9 C)     Temp Source 05/01/21 2104 Oral     SpO2 05/01/21 2104 95 %     Weight 05/01/21 2104 245 lb (111.1 kg)     Height 05/01/21 2104 5\' 11"  (1.803 m)     Head Circumference --      Peak Flow --      Pain Score 05/01/21 2057 10     Pain Loc --      Pain Edu? --      Excl. in [place]? --     Most recent vital signs: Vitals:   05/01/21 2104 05/01/21 2216  BP: (!) 146/85 134/80  Pulse: 88 80  Resp: 20 20  Temp: 98.4 F (36.9 C)   SpO2: 95% 96%     General: Awake, no distress.  CV:  Good peripheral perfusion.  Regular rate and rhythm Resp:  Normal effort.  Clear to auscultation bilaterally Abd:  No distention.  Soft and nontender.  No hernias. Other:  Genitourinary exam shows normal genitalia.  There is erythema of the groin consistent with candidiasis.  No crepitus or induration.  Not significantly tender.  No open wounds  Chest wall is tender to the touch at the  left sternal margin in the area of indicated pain, reproducing his symptoms.   ED Results / Procedures / Treatments   Labs (all labs ordered are listed, but only abnormal results are displayed) Labs Reviewed  BASIC METABOLIC PANEL - Abnormal; Notable for the following components:      Result Value   Glucose, Bld 184 (*)    All other components within normal limits  CBC  TROPONIN I (HIGH SENSITIVITY)  TROPONIN I (HIGH SENSITIVITY)     EKG  Interpreted by me Normal sinus rhythm rate of 97.  Normal axis, normal intervals.  Poor R wave progression.  Left bundle branch block.  No acute ischemic changes.   RADIOLOGY Chest x-ray viewed and interpreted by me, appears normal.  Radiology report reviewed.    PROCEDURES:  Critical Care performed: No  Procedures   MEDICATIONS ORDERED IN ED: Medications - No data to display   IMPRESSION / MDM / [place] / ED COURSE  I reviewed the triage vital signs and the nursing notes.  Differential diagnosis includes, but is not limited to, chest wall pain, gastritis, diverticulitis, colon polyp, genital candidiasis    Patient presents with multiple complaints. 1) chest pain Noncardiac, clinically apparent chest wall pain, reproducible on exam.  No further work-up.  Doubt ACS PE dissection AAA pneumothorax or pericarditis.  EKG and chest x-ray are unremarkable.  2) genital swelling Candidiasis.  There is no hernia, torsion, scrotal abscess, necrotizing fasciitis/Fournier's.  Treat with topical nystatin powder.  3) reported blood in stool Vitals and CBC are normal.  He is nontoxic, abdomen is benign.  Will start on PPI, recommend follow-up with gastroenterology for further assessment, consideration of endoscopy.     FINAL CLINICAL IMPRESSION(S) / ED DIAGNOSES   Final diagnoses:  Chest wall pain  Candidiasis of genitalia     Rx / DC Orders   ED Discharge Orders          Ordered     omeprazole (PRILOSEC OTC) 20 MG tablet  Daily        05/01/21 2315    nystatin (MYCOSTATIN/NYSTOP) powder  3 times daily        05/01/21 2315             Note:  This document was prepared using Dragon voice recognition software and may include unintentional dictation errors.   Carrie Mew, MD 05/01/21 2322

## 2021-05-01 NOTE — ED Triage Notes (Signed)
Pt presents via POV c/o chest pain and testicle swelling x3 days. Reports both started around the same time. Reports mid sternal chest pain associated with SOB. Ambulatory to triage in NAD.

## 2021-05-08 MED ORDER — DIVALPROEX 500 MG TABLET,DELAYED RELEASE
ORAL_TABLET | 0 refills | 0 days
Start: 2021-05-08 — End: ?

## 2021-05-15 MED ORDER — DIVALPROEX 500 MG TABLET,DELAYED RELEASE
ORAL_TABLET | 0 refills | 0 days | Status: CP
Start: 2021-05-15 — End: ?

## 2021-05-29 ENCOUNTER — Inpatient Hospital Stay
Admission: EM | Admit: 2021-05-29 | Discharge: 2021-06-01 | DRG: 287 | Disposition: A | Discharge status: Home / Self Care | Payer: Medicare HMO | Attending: Internal Medicine | Admitting: Internal Medicine

## 2021-05-29 ENCOUNTER — Emergency Department: Payer: Medicare HMO

## 2021-05-29 ENCOUNTER — Encounter: Payer: Self-pay | Admitting: Emergency Medicine

## 2021-05-29 DIAGNOSIS — E1169 Type 2 diabetes mellitus with other specified complication: Secondary | ICD-10-CM | POA: Diagnosis present

## 2021-05-29 DIAGNOSIS — G4733 Obstructive sleep apnea (adult) (pediatric): Secondary | ICD-10-CM | POA: Diagnosis present

## 2021-05-29 DIAGNOSIS — F200 Paranoid schizophrenia: Secondary | ICD-10-CM | POA: Diagnosis present

## 2021-05-29 DIAGNOSIS — F209 Schizophrenia, unspecified: Secondary | ICD-10-CM | POA: Diagnosis present

## 2021-05-29 DIAGNOSIS — Z87891 Personal history of nicotine dependence: Secondary | ICD-10-CM

## 2021-05-29 DIAGNOSIS — Z794 Long term (current) use of insulin: Secondary | ICD-10-CM

## 2021-05-29 DIAGNOSIS — R079 Chest pain, unspecified: Secondary | ICD-10-CM | POA: Diagnosis present

## 2021-05-29 DIAGNOSIS — I5023 Acute on chronic systolic (congestive) heart failure: Secondary | ICD-10-CM | POA: Diagnosis not present

## 2021-05-29 DIAGNOSIS — Z6834 Body mass index (BMI) 34.0-34.9, adult: Secondary | ICD-10-CM

## 2021-05-29 DIAGNOSIS — F319 Bipolar disorder, unspecified: Secondary | ICD-10-CM | POA: Diagnosis present

## 2021-05-29 DIAGNOSIS — N5089 Other specified disorders of the male genital organs: Secondary | ICD-10-CM | POA: Diagnosis present

## 2021-05-29 DIAGNOSIS — K219 Gastro-esophageal reflux disease without esophagitis: Secondary | ICD-10-CM | POA: Diagnosis present

## 2021-05-29 DIAGNOSIS — I429 Cardiomyopathy, unspecified: Secondary | ICD-10-CM

## 2021-05-29 DIAGNOSIS — I2510 Atherosclerotic heart disease of native coronary artery without angina pectoris: Secondary | ICD-10-CM | POA: Diagnosis present

## 2021-05-29 DIAGNOSIS — R7989 Other specified abnormal findings of blood chemistry: Secondary | ICD-10-CM

## 2021-05-29 DIAGNOSIS — I10 Essential (primary) hypertension: Secondary | ICD-10-CM | POA: Diagnosis not present

## 2021-05-29 DIAGNOSIS — Z7984 Long term (current) use of oral hypoglycemic drugs: Secondary | ICD-10-CM

## 2021-05-29 DIAGNOSIS — I447 Left bundle-branch block, unspecified: Secondary | ICD-10-CM | POA: Diagnosis present

## 2021-05-29 DIAGNOSIS — I428 Other cardiomyopathies: Secondary | ICD-10-CM | POA: Diagnosis present

## 2021-05-29 DIAGNOSIS — R0789 Other chest pain: Secondary | ICD-10-CM | POA: Diagnosis not present

## 2021-05-29 DIAGNOSIS — R9439 Abnormal result of other cardiovascular function study: Secondary | ICD-10-CM

## 2021-05-29 DIAGNOSIS — Z79899 Other long term (current) drug therapy: Secondary | ICD-10-CM

## 2021-05-29 DIAGNOSIS — Z20822 Contact with and (suspected) exposure to covid-19: Secondary | ICD-10-CM | POA: Diagnosis present

## 2021-05-29 DIAGNOSIS — Z743 Need for continuous supervision: Secondary | ICD-10-CM | POA: Diagnosis not present

## 2021-05-29 DIAGNOSIS — I272 Pulmonary hypertension, unspecified: Secondary | ICD-10-CM | POA: Diagnosis present

## 2021-05-29 DIAGNOSIS — E785 Hyperlipidemia, unspecified: Secondary | ICD-10-CM | POA: Diagnosis present

## 2021-05-29 DIAGNOSIS — E669 Obesity, unspecified: Secondary | ICD-10-CM | POA: Diagnosis present

## 2021-05-29 DIAGNOSIS — Z8572 Personal history of non-Hodgkin lymphomas: Secondary | ICD-10-CM

## 2021-05-29 DIAGNOSIS — J432 Centrilobular emphysema: Secondary | ICD-10-CM | POA: Diagnosis present

## 2021-05-29 LAB — RESP PANEL BY RT-PCR (FLU A&B, COVID) ARPGX2
Influenza A by PCR: NEGATIVE
Influenza B by PCR: NEGATIVE
SARS Coronavirus 2 by RT PCR: NEGATIVE

## 2021-05-29 LAB — CBC
HCT: 41.2 % (ref 39.0–52.0)
Hemoglobin: 13.6 g/dL (ref 13.0–17.0)
MCH: 29.2 pg (ref 26.0–34.0)
MCHC: 33 g/dL (ref 30.0–36.0)
MCV: 88.4 fL (ref 80.0–100.0)
Platelets: 233 10*3/uL (ref 150–400)
RBC: 4.66 MIL/uL (ref 4.22–5.81)
RDW: 13.6 % (ref 11.5–15.5)
WBC: 5.8 10*3/uL (ref 4.0–10.5)
nRBC: 0 % (ref 0.0–0.2)

## 2021-05-29 LAB — BASIC METABOLIC PANEL
Anion gap: 12 (ref 5–15)
BUN: 15 mg/dL (ref 6–20)
CO2: 26 mmol/L (ref 22–32)
Calcium: 9.5 mg/dL (ref 8.9–10.3)
Chloride: 101 mmol/L (ref 98–111)
Creatinine, Ser: 0.98 mg/dL (ref 0.61–1.24)
GFR, Estimated: >60 mL/min (ref >60)
Glucose, Bld: 181 mg/dL — ABNORMAL HIGH (ref 70–99)
Potassium: 3.9 mmol/L (ref 3.5–5.1)
Sodium: 139 mmol/L (ref 135–145)

## 2021-05-29 LAB — HEPATIC FUNCTION PANEL
ALT: 23 U/L (ref 0–44)
AST: 21 U/L (ref 15–41)
Albumin: 4 g/dL (ref 3.5–5.0)
Alkaline Phosphatase: 56 U/L (ref 38–126)
Bilirubin, Direct: <0.1 mg/dL (ref 0.0–0.2)
Total Bilirubin: 0.2 mg/dL — ABNORMAL LOW (ref 0.3–1.2)
Total Protein: 7.9 g/dL (ref 6.5–8.1)

## 2021-05-29 LAB — LIPASE, BLOOD: Lipase: 51 U/L (ref 11–51)

## 2021-05-29 LAB — TROPONIN I (HIGH SENSITIVITY): Troponin I (High Sensitivity): 8 ng/L (ref <18)

## 2021-05-29 LAB — BRAIN NATRIURETIC PEPTIDE: B Natriuretic Peptide: 20.5 pg/mL (ref 0.0–100.0)

## 2021-05-29 LAB — LACTIC ACID, PLASMA: Lactic Acid, Venous: 3 mmol/L (ref 0.5–1.9)

## 2021-05-29 LAB — D-DIMER, QUANTITATIVE: D-Dimer, Quant: 0.52 ug/mL-FEU — ABNORMAL HIGH (ref 0.00–0.50)

## 2021-05-29 MED ORDER — IOHEXOL 350 MG/ML SOLN
100.0000 mL | Freq: Once | INTRAVENOUS | Status: AC | PRN
  Administered 2021-05-29: 100 mL via INTRAVENOUS

## 2021-05-29 MED ORDER — SODIUM CHLORIDE 0.9 % IV BOLUS
1000.0000 mL | Freq: Once | INTRAVENOUS | Status: AC
  Administered 2021-05-29: 1000 mL via INTRAVENOUS

## 2021-05-29 NOTE — ED Triage Notes (Signed)
Pt arrived via [facility] from home with intermittent chest pain x2 days with sudden onset of left sided chest pain that radiates down left arm. Pt received '324mg'$  Aspirin and 1 spray of Nitro in route by EMS with pain relief to 6/10 from 10/10. Cardiac hx known. Pt denies SOB, N/V. ?

## 2021-05-29 NOTE — ED Provider Notes (Addendum)
? ?[facility] ?Provider Note ? ? ? Event Date/Time  ? First MD Initiated Contact with Patient 05/29/21 2154   ?  (approximate) ? ? ?History  ? ?Chest Pain ? ? ?HPI ? ?Randy [name] is a 51 y.o. male with a history of COPD, GERD, prediabetes, and schizophrenia who presents with chest pain over the last 3 days, intermittent course, nonexertional, and described as sharp.  It is mainly in the center of his chest.  He reports some associated lightheadedness, shortness of breath, and generalized weakness.  His family member states that he has had very low energy over the last couple of days.  The patient reports decreased appetite but denies any vomiting or diarrhea.  He has no fever.  He does report some leg swelling.  He states that the pain over the last 3 days is significantly worse than what he came to the ED with a month ago. ? ? ?Physical Exam  ? ?Triage Vital Signs: ?ED Triage Vitals  ?Enc Vitals Group  ?   BP 05/29/21 2158 125/68  ?   Pulse Rate 05/29/21 2158 87  ?   Resp 05/29/21 2158 17  ?   Temp 05/29/21 2158 99.2 ?F (37.3 ?C)  ?   Temp Source 05/29/21 2158 Oral  ?   SpO2 05/29/21 2158 95 %  ?   Weight 05/29/21 2217 244 lb 14.9 oz (111.1 kg)  ?   Height 05/29/21 2217 '5\' 11"'$  (1.803 m)  ?   Head Circumference --   ?   Peak Flow --   ?   Pain Score 05/29/21 2158 0  ?   Pain Loc --   ?   Pain Edu? --   ?   Excl. in [place]? --   ? ? ?Most recent vital signs: ?Vitals:  ? 05/29/21 2230 05/29/21 2330  ?BP: 127/70 127/67  ?Pulse: 75 73  ?Resp: (!) 25 (!) 23  ?Temp:    ?SpO2: 94% 96%  ? ? ? ?General: Alert and oriented, no distress.  ?CV:  Good peripheral perfusion.  Normal heart sounds. ?Resp:  Normal effort.  Lungs CTAB. ?Abd:  No distention.  ?Other:  No peripheral edema. ? ? ?ED Results / Procedures / Treatments  ? ?Labs ?(all labs ordered are listed, but only abnormal results are displayed) ?Labs Reviewed  ?BASIC METABOLIC PANEL - Abnormal; Notable for the following components:  ?    Result  Value  ? Glucose, Bld 181 (*)   ? All other components within normal limits  ?D-DIMER, QUANTITATIVE - Abnormal; Notable for the following components:  ? D-Dimer, Quant 0.52 (*)   ? All other components within normal limits  ?LACTIC ACID, PLASMA - Abnormal; Notable for the following components:  ? Lactic Acid, Venous 3.0 (*)   ? All other components within normal limits  ?HEPATIC FUNCTION PANEL - Abnormal; Notable for the following components:  ? Total Bilirubin 0.2 (*)   ? All other components within normal limits  ?RESP PANEL BY RT-PCR (FLU A&B, COVID) [id number]  ?CBC  ?BRAIN NATRIURETIC PEPTIDE  ?LIPASE, BLOOD  ?LACTIC ACID, PLASMA  ?TROPONIN I (HIGH SENSITIVITY)  ?TROPONIN I (HIGH SENSITIVITY)  ? ? ? ?EKG ? ?ED ECG REPORT ?IArta Silence, the attending physician, personally viewed and interpreted this ECG. ? ?Date: 05/29/2021 ?EKG Time: 2203 ?Rate: 79 ?Rhythm: normal sinus rhythm ?QRS Axis: normal ?Intervals: LBBB ?ST/T Wave abnormalities: Nonspecific T wave abnormalities ?Narrative Interpretation: LBBB with no evidence of acute ischemia;  no significant change when compared to EKG of 05/01/2021 although LBBB was indicated as nonspecific intraventricular conduction block at that time ? ? ? ?RADIOLOGY ? ?Chest x-ray: I independently viewed and interpreted the images; there is no focal consolidation or edema ? ?PROCEDURES: ? ?Critical Care performed: No ? ?Procedures ? ? ?MEDICATIONS ORDERED IN ED: ?Medications  ?sodium chloride 0.9 % bolus 1,000 mL (1,000 mLs Intravenous New Bag/Given 05/29/21 2329)  ?iohexol (OMNIPAQUE) 350 MG/ML injection 100 mL (100 mLs Intravenous Contrast Given 05/29/21 2317)  ? ? ? ?IMPRESSION / MDM / ASSESSMENT AND PLAN / ED COURSE  ?I reviewed the triage vital signs and the nursing notes. ? ?51 year old male with PMH as noted above but no prior cardiac history presents with intermittent and atypical chest pain over the last 3 days along with some lightheadedness, shortness of breath, and  generalized weakness. ? ?I reviewed the past medical records; the patient was seen here in the ED on 2/6 with atypical chest pain and negative work-up.  His most recent primary care visit was from 8/16.  He is on metformin for diabetes and atorvastatin for hyperlipidemia. ? ?On exam the patient is relatively well-appearing.  His vital signs are normal except for borderline elevated temperature.  The physical exam is otherwise unremarkable for acute findings.  Although the patient reports some leg swelling, there is no significant palpable edema on exam. ? ?EKG is nonischemic; there is an LBBB.  Most recent EKG from a month ago shows a nonspecific intraventricular conduction block with similar morphology, so the LBBB is not new. ? ?Differential diagnosis includes, but is not limited to, musculoskeletal pain, GERD, acute bronchitis, pneumonia, COVID-19 or other viral syndrome, or less likely PE.  I have a low suspicion for aortic dissection or other vascular cause given the atypical quality and intermittent nature of pain and reassuring vital signs. ? ?We will obtain lab work-up including cardiac enzymes and D-dimer, chest x-ray, and reassess. ? ?The patient is on the cardiac monitor to evaluate for evidence of arrhythmia and/or significant heart rate changes. ? ?----------------------------------------- ?12:30 AM on 05/30/2021 ?----------------------------------------- ? ?D-dimer was slightly elevated so I obtained a CT angio of the chest which is negative for PE or pneumonia.  The patient's lactic acid is elevated.  Vital signs do not support sepsis and he has no source of infection at this time.  It could be from hypovolemia/dehydration.  I have ordered fluids.  Urinalysis is still pending.  Given the lactate however, we will plan for admission for further monitoring.  ? ?I consulted Dr. Posey Pronto from the hospitalist service; based on her discussion she agrees to admit the patient. ? ? ? ?FINAL CLINICAL IMPRESSION(S)  / ED DIAGNOSES  ? ?Final diagnoses:  ?Nonspecific chest pain  ?Elevated lactic acid level  ? ? ? ?Rx / DC Orders  ? ?ED Discharge Orders   ? ? None  ? ?  ? ? ? ?Note:  This document was prepared using Dragon voice recognition software and may include unintentional dictation errors.  ?  Arta Silence, MD ?05/30/21 0017 ? ?  ?Arta Silence, MD ?05/30/21 0030 ? ?

## 2021-05-30 ENCOUNTER — Observation Stay (HOSPITAL_COMMUNITY): Payer: Medicare HMO

## 2021-05-30 ENCOUNTER — Other Ambulatory Visit: Payer: Self-pay

## 2021-05-30 ENCOUNTER — Observation Stay (HOSPITAL_BASED_OUTPATIENT_CLINIC_OR_DEPARTMENT_OTHER)
Admit: 2021-05-30 | Discharge: 2021-05-30 | Disposition: A | Discharge status: Home / Self Care | Payer: Medicare HMO | Attending: Internal Medicine | Admitting: Internal Medicine

## 2021-05-30 ENCOUNTER — Encounter: Payer: Self-pay | Admitting: Internal Medicine

## 2021-05-30 ENCOUNTER — Observation Stay: Admit: 2021-05-30 | Payer: Medicare HMO

## 2021-05-30 DIAGNOSIS — I429 Cardiomyopathy, unspecified: Secondary | ICD-10-CM

## 2021-05-30 DIAGNOSIS — I428 Other cardiomyopathies: Secondary | ICD-10-CM

## 2021-05-30 DIAGNOSIS — I5020 Unspecified systolic (congestive) heart failure: Secondary | ICD-10-CM | POA: Diagnosis not present

## 2021-05-30 DIAGNOSIS — R079 Chest pain, unspecified: Secondary | ICD-10-CM

## 2021-05-30 DIAGNOSIS — R9431 Abnormal electrocardiogram [ECG] [EKG]: Secondary | ICD-10-CM | POA: Diagnosis not present

## 2021-05-30 DIAGNOSIS — I447 Left bundle-branch block, unspecified: Secondary | ICD-10-CM | POA: Diagnosis not present

## 2021-05-30 HISTORY — DX: Other cardiomyopathies: I42.8

## 2021-05-30 LAB — LIPID PANEL
Cholesterol: 135 mg/dL (ref 0–200)
HDL: 45 mg/dL (ref >40)
LDL Cholesterol: 49 mg/dL (ref 0–99)
Total CHOL/HDL Ratio: 3 RATIO
Triglycerides: 203 mg/dL — ABNORMAL HIGH (ref <150)
VLDL: 41 mg/dL — ABNORMAL HIGH (ref 0–40)

## 2021-05-30 LAB — HEMOGLOBIN A1C
Hgb A1c MFr Bld: 6.2 % — ABNORMAL HIGH (ref 4.8–5.6)
Mean Plasma Glucose: 131 mg/dL

## 2021-05-30 LAB — ECHOCARDIOGRAM COMPLETE
AR max vel: 1.63 cm2
AV Area VTI: 1.67 cm2
AV Area mean vel: 1.61 cm2
AV Mean grad: 8 mmHg
AV Peak grad: 12.7 mmHg
Ao pk vel: 1.78 m/s
Area-P 1/2: 4.12 cm2
Height: 71 in
MV VTI: 2.22 cm2
S' Lateral: 3.99 cm
Weight: 3918.9 oz

## 2021-05-30 LAB — CBC
HCT: 36.8 % — ABNORMAL LOW (ref 39.0–52.0)
Hemoglobin: 12.1 g/dL — ABNORMAL LOW (ref 13.0–17.0)
MCH: 29.4 pg (ref 26.0–34.0)
MCHC: 32.9 g/dL (ref 30.0–36.0)
MCV: 89.3 fL (ref 80.0–100.0)
Platelets: 205 10*3/uL (ref 150–400)
RBC: 4.12 MIL/uL — ABNORMAL LOW (ref 4.22–5.81)
RDW: 13.5 % (ref 11.5–15.5)
WBC: 5.3 10*3/uL (ref 4.0–10.5)
nRBC: 0 % (ref 0.0–0.2)

## 2021-05-30 LAB — NM MYOCAR MULTI W/SPECT W/WALL MOTION / EF
LV dias vol: 139 mL (ref 62–150)
LV sys vol: 84 mL
MPHR: 169 {beats}/min
Nuc Stress EF: 40 %
Peak HR: 98 {beats}/min
Percent HR: 57 %
Rest HR: 63 {beats}/min
Rest Nuclear Isotope Dose: 10.9 mCi
SDS: 2
SRS: 8
SSS: 9
ST Depression (mm): 0 mm
Stress Nuclear Isotope Dose: 30.7 mCi
TID: 1.03

## 2021-05-30 LAB — LACTIC ACID, PLASMA: Lactic Acid, Venous: 1.3 mmol/L (ref 0.5–1.9)

## 2021-05-30 LAB — BASIC METABOLIC PANEL
Anion gap: 6 (ref 5–15)
BUN: 15 mg/dL (ref 6–20)
CO2: 27 mmol/L (ref 22–32)
Calcium: 8.9 mg/dL (ref 8.9–10.3)
Chloride: 106 mmol/L (ref 98–111)
Creatinine, Ser: 0.9 mg/dL (ref 0.61–1.24)
GFR, Estimated: >60 mL/min (ref >60)
Glucose, Bld: 100 mg/dL — ABNORMAL HIGH (ref 70–99)
Potassium: 4.1 mmol/L (ref 3.5–5.1)
Sodium: 139 mmol/L (ref 135–145)

## 2021-05-30 LAB — CBG MONITORING, ED
Glucose-Capillary: 102 mg/dL — ABNORMAL HIGH (ref 70–99)
Glucose-Capillary: 95 mg/dL (ref 70–99)

## 2021-05-30 LAB — GLUCOSE, CAPILLARY
Glucose-Capillary: 154 mg/dL — ABNORMAL HIGH (ref 70–99)
Glucose-Capillary: 86 mg/dL (ref 70–99)

## 2021-05-30 LAB — TSH: TSH: 2.44 u[IU]/mL (ref 0.350–4.500)

## 2021-05-30 LAB — TROPONIN I (HIGH SENSITIVITY): Troponin I (High Sensitivity): 10 ng/L (ref <18)

## 2021-05-30 LAB — T4, FREE: Free T4: 0.8 ng/dL (ref 0.61–1.12)

## 2021-05-30 MED ORDER — NITROGLYCERIN 0.4 MG SL SUBL
0.4000 mg | SUBLINGUAL_TABLET | SUBLINGUAL | Status: DC | PRN
  Administered 2021-05-30: 0.4 mg via SUBLINGUAL
  Filled 2021-05-30: qty 1

## 2021-05-30 MED ORDER — SODIUM CHLORIDE 0.9 % IV SOLN: 250.0000 mL | INTRAVENOUS | Status: DC | PRN

## 2021-05-30 MED ORDER — METOPROLOL SUCCINATE ER 25 MG PO TB24
12.5000 mg | ORAL_TABLET | Freq: Every day | ORAL | Status: DC
  Administered 2021-05-30 – 2021-06-01 (×3): 12.5 mg via ORAL
  Filled 2021-05-30: qty 0.5
  Filled 2021-05-30: qty 1
  Filled 2021-05-30: qty 0.5
  Filled 2021-05-30: qty 1

## 2021-05-30 MED ORDER — REGADENOSON 0.4 MG/5ML IV SOLN
0.4000 mg | Freq: Once | INTRAVENOUS | Status: AC
  Administered 2021-05-30: 0.4 mg via INTRAVENOUS
  Filled 2021-05-30: qty 5

## 2021-05-30 MED ORDER — ONDANSETRON HCL 4 MG/2ML IJ SOLN
4.0000 mg | Freq: Four times a day (QID) | INTRAMUSCULAR | Status: DC | PRN
  Administered 2021-05-31: 4 mg via INTRAVENOUS
  Filled 2021-05-30: qty 2

## 2021-05-30 MED ORDER — SODIUM CHLORIDE 0.9% FLUSH: 3.0000 mL | INTRAVENOUS | Status: DC | PRN

## 2021-05-30 MED ORDER — ASPIRIN EC 81 MG PO TBEC: 81.0000 mg | DELAYED_RELEASE_TABLET | Freq: Every day | ORAL | Status: DC

## 2021-05-30 MED ORDER — SODIUM CHLORIDE 0.9 % IV SOLN: INTRAVENOUS | Status: DC

## 2021-05-30 MED ORDER — NICOTINE 21 MG/24HR TD PT24
21.0000 mg | MEDICATED_PATCH | Freq: Every day | TRANSDERMAL | Status: DC
  Administered 2021-05-30 – 2021-06-01 (×3): 21 mg via TRANSDERMAL
  Filled 2021-05-30 (×3): qty 1

## 2021-05-30 MED ORDER — SODIUM CHLORIDE 0.9% FLUSH
3.0000 mL | Freq: Two times a day (BID) | INTRAVENOUS | Status: DC
  Administered 2021-05-30 – 2021-05-31 (×4): 3 mL via INTRAVENOUS

## 2021-05-30 MED ORDER — TECHNETIUM TC 99M TETROFOSMIN IV KIT
30.0000 | PACK | Freq: Once | INTRAVENOUS | Status: AC | PRN
  Administered 2021-05-30: 30.7 via INTRAVENOUS
  Filled 2021-05-30: qty 30

## 2021-05-30 MED ORDER — ACETAMINOPHEN 325 MG PO TABS: 650.0000 mg | ORAL_TABLET | ORAL | Status: DC | PRN

## 2021-05-30 MED ORDER — INSULIN ASPART 100 UNIT/ML IJ SOLN: 0.0000 [IU] | Freq: Three times a day (TID) | INTRAMUSCULAR | Status: DC

## 2021-05-30 MED ORDER — TECHNETIUM TC 99M TETROFOSMIN IV KIT
10.0000 | PACK | Freq: Once | INTRAVENOUS | Status: AC | PRN
  Administered 2021-05-30: 10.91 via INTRAVENOUS
  Filled 2021-05-30: qty 10

## 2021-05-30 MED ORDER — HEPARIN SODIUM (PORCINE) 5000 UNIT/ML IJ SOLN
5000.0000 [IU] | Freq: Three times a day (TID) | INTRAMUSCULAR | Status: DC
  Administered 2021-05-30 – 2021-05-31 (×3): 5000 [IU] via SUBCUTANEOUS
  Filled 2021-05-30 (×3): qty 1

## 2021-05-30 MED ORDER — LOSARTAN POTASSIUM 25 MG PO TABS
25.0000 mg | ORAL_TABLET | Freq: Every day | ORAL | Status: DC
Start: 2021-05-30 — End: 2021-06-01
  Administered 2021-05-30 – 2021-05-31 (×2): 25 mg via ORAL
  Filled 2021-05-30 (×2): qty 1

## 2021-05-30 MED ORDER — PERFLUTREN LIPID MICROSPHERE
1.0000 mL | INTRAVENOUS | Status: AC | PRN
  Administered 2021-05-30: 2 mL via INTRAVENOUS
  Filled 2021-05-30: qty 10

## 2021-05-30 MED ORDER — ATORVASTATIN CALCIUM 20 MG PO TABS
40.0000 mg | ORAL_TABLET | Freq: Every day | ORAL | Status: DC
  Administered 2021-05-30 – 2021-06-01 (×3): 40 mg via ORAL
  Filled 2021-05-30 (×4): qty 2

## 2021-05-30 NOTE — ED Notes (Signed)
Pt returned from stress test to room ?

## 2021-05-30 NOTE — Progress Notes (Signed)
PROGRESS NOTE ? ?Brief Narrative: ?Randy [name] is a 51 y.o. male with a history of tobacco use, OSA, COPD, T2DM, hyperlipidemia, and schizophrenia who presented to the ED last night for progressive chest pain and shortness of breath. Chest pain was substernal,  began at rest the previous day without provocation, somewhat worse with exertion, and became associated with dyspnea, worsened to 8/10 prompting his wife to call EMS. NTG was given with some improvement. ECG revealed LBBB, inferolateral TWI (chronic), troponin 8 > 10, BNP wnl, d-dimer 0.52, subsequent CTA chest negative for PE. He was admitted this morning by Dr. Posey Pronto. Echocardiogram has been performed revealing global hypokinesis with LVEF (35-40%), G1DD, normal RV, question of restricted right coronary leaflet of AV. Stress test is pending, and cardiology is consulted. ? ?Subjective: ?Chest pain had improved as of this morning, but was still 6/10, middle of chest, present at rest, worse with my palpation, radiating downward, not changed by po intake, no current dyspnea.  ? ?Objective: ?BP 128/88 (BP Location: Left Arm)   Pulse 68   Temp 98.6 ?F (37 ?C) (Oral)   Resp 20   Ht '5\' 11"'$  (1.803 m)   Wt 111.1 kg   SpO2 100%   BMI 34.16 kg/m?   ?Gen: No acute distress ?Pulm: Clear and nonlabored  ?CV: RRR, II/VI systolic murmur at apex, no JVD, no edema ? ?Assessment & Plan: ?New diagnosis HFrEF:  ?- Start low dose ARB (currently normotensive), HR baseline appears to be in 60's. May be able to tolerate low dose metoprolol succinate. ?- I will not start diuretic at this time ?- Cardiology consulted. Appreciate further recommendations.  ? ?Chest pain: Though atypical and cardiac enzymes reassuring overall, LV dysfunction appears to be global without regional WMA, he does have LBBB. LDL 49, HDL 45.  ?- Stress test pending, further evaluation deferred to cardiology assessment. ?- prn NTG ? ?Patrecia Pour, MD ?Pager on amion ?05/30/2021, 4:30 PM   ?

## 2021-05-30 NOTE — Assessment & Plan Note (Signed)
N.p.o. except sips of meds. ?Continue patient on CPAP per home settings. ?

## 2021-05-30 NOTE — Progress Notes (Signed)
*  PRELIMINARY RESULTS* ?Echocardiogram ?2D Echocardiogram has been performed. ? ?Randy [name] ?05/30/2021, 1:35 PM ?

## 2021-05-30 NOTE — Assessment & Plan Note (Signed)
Stable. ?We will continue patient on his as needed MDI with Ventolin. ?

## 2021-05-30 NOTE — ED Notes (Signed)
Pt transported to complete stress test at this time ?

## 2021-05-30 NOTE — H&P (Signed)
History and Physical    Patient: Randy [name] PPI:[id number] DOB: July 13, 1970 DOA: 05/29/2021 DOS: the patient was seen and examined on 05/30/2021 PCP: Olin Hauser, DO  Patient coming from: Home  Chief Complaint:  Chief Complaint  Patient presents with   Chest Pain   HPI: Randy [name] is a 51 y.o. male with medical history significant of diabetes mellitus type 2, emphysema, schizophrenia, OSA.  Patient presenting to [place] with chest pain that is midsternal with following: Duration:3 days  Frequency:constant   Location:middle of chest.   Quality:sharp pain.   Rate:6/10  Radiation:pain is going down from esopahgus to epigastrium .   Aggravating: None/.   Alleviating:nitroglycerine.   Associated factors: SOB +, plapitation.+/.  Review of Systems:  Review of Systems  Cardiovascular:  Positive for chest pain.  Neurological:  Positive for headaches.  All other systems reviewed and are negative.  Past Medical History:  Diagnosis Date   Allergy    Bipolar 1 disorder ([place])    COPD (chronic obstructive pulmonary disease) (HCC)    GERD (gastroesophageal reflux disease)    History reviewed. No pertinent surgical history. Social History:  reports that he has quit smoking. His smoking use included cigarettes. His smokeless tobacco use includes chew. He reports that he does not drink alcohol and does not use drugs.  No Known Allergies  Family History  Problem Relation Age of Onset   Alcohol abuse Mother    Cancer Father        colon    Colon cancer Father     Prior to Admission medications   Medication Sig Start Date End Date Taking? Authorizing Provider  albuterol (PROAIR HFA) 108 (90 Base) MCG/ACT inhaler Inhale 1-2 puffs into the lungs every 6 (six) hours as needed for wheezing or shortness of breath. 11/08/20   Karamalegos, Devonne Doughty, DO  ARIPiprazole (ABILIFY) 10 MG tablet Take 20 mg by mouth daily. 05/02/20 06/29/20  [provider]   atorvastatin (LIPITOR) 20 MG tablet Take 1 tablet (20 mg total) by mouth daily. 11/08/20   Karamalegos, Devonne Doughty, DO  cetirizine (ZYRTEC) 10 MG tablet Take 1 tablet (10 mg total) by mouth daily. 11/04/17   Karamalegos, Devonne Doughty, DO  divalproex (DEPAKOTE ER) 500 MG 24 hr tablet Take 1,000 mg by mouth at bedtime. 12/23/19 03/22/20  [provider]  doxepin (SINEQUAN) 10 MG capsule Take 20 mg by mouth at bedtime. 12/23/19   [provider]  FLUoxetine (PROZAC) 20 MG capsule Take 1 capsule by mouth daily. 07/25/20 07/25/21  [provider]  metFORMIN (GLUCOPHAGE-XR) 500 MG 24 hr tablet Take 1 tablet (500 mg total) by mouth 2 (two) times daily with a meal. 11/08/20   Karamalegos, Devonne Doughty, DO  omeprazole (PRILOSEC OTC) 20 MG tablet Take 1 tablet (20 mg total) by mouth daily. 05/01/21 05/31/21  Carrie Mew, MD  ondansetron (ZOFRAN ODT) 4 MG disintegrating tablet Allow 1-2 tablets to dissolve in your mouth every 8 hours as needed for nausea/vomiting 10/14/20   Hinda Kehr, MD    Physical Exam: Vitals:   05/29/21 2158 05/29/21 2217 05/29/21 2230 05/29/21 2330  BP: 125/68  127/70 127/67  Pulse: 87  75 73  Resp: 17  (!) 25 (!) 23  Temp: 99.2 F (37.3 C)     TempSrc: Oral     SpO2: 95%  94% 96%  Weight:  111.1 kg    Height:  '5\' 11"'$  (1.803 m)    Physical Exam Vitals and nursing  note reviewed.  Constitutional:      General: He is not in acute distress.    Appearance: Normal appearance. He is not ill-appearing, toxic-appearing or diaphoretic.  HENT:     Head: Normocephalic and atraumatic.     Right Ear: Hearing and external ear normal.     Left Ear: Hearing and external ear normal.     Nose: Nose normal. No nasal deformity.     Mouth/Throat:     Lips: Pink.     Mouth: Mucous membranes are moist.     Tongue: No lesions.     Pharynx: Oropharynx is clear.  Eyes:     Extraocular Movements: Extraocular movements intact.     Pupils: Pupils are equal, round, and  reactive to light.  Neck:     Vascular: No carotid bruit.  Cardiovascular:     Rate and Rhythm: Normal rate and regular rhythm.     Pulses: Normal pulses.     Heart sounds: Normal heart sounds.  Pulmonary:     Effort: Pulmonary effort is normal.     Breath sounds: Normal breath sounds.  Chest:     Chest wall: Tenderness present.    Abdominal:     General: Bowel sounds are normal. There is no distension.     Palpations: Abdomen is soft. There is no mass.     Tenderness: There is no abdominal tenderness. There is no guarding.     Hernia: No hernia is present.  Musculoskeletal:       Arms:     Right lower leg: No edema.     Left lower leg: No edema.     Comments: Reproducible tenderness  Skin:    General: Skin is warm.  Neurological:     General: No focal deficit present.     Mental Status: He is alert and oriented to person, place, and time.     Cranial Nerves: Cranial nerves 2-12 are intact.     Motor: Motor function is intact.  Psychiatric:        Attention and Perception: Attention normal.        Mood and Affect: Mood normal.        Speech: Speech normal.        Behavior: Behavior normal. Behavior is cooperative.        Cognition and Memory: Cognition normal.    Data Reviewed: Results for orders placed or performed during the hospital encounter of 05/29/21 (from the past 24 hour(s))  Basic metabolic panel     Status: Abnormal   Collection Time: 05/29/21 10:14 PM  Result Value Ref Range   Sodium 139 135 - 145 mmol/L   Potassium 3.9 3.5 - 5.1 mmol/L   Chloride 101 98 - 111 mmol/L   CO2 26 22 - 32 mmol/L   Glucose, Bld 181 (H) 70 - 99 mg/dL   BUN 15 6 - 20 mg/dL   Creatinine, Ser 0.98 0.61 - 1.24 mg/dL   Calcium 9.5 8.9 - 10.3 mg/dL   GFR, Estimated >60 >60 mL/min   Anion gap 12 5 - 15  CBC     Status: None   Collection Time: 05/29/21 10:14 PM  Result Value Ref Range   WBC 5.8 4.0 - 10.5 K/uL   RBC 4.66 4.22 - 5.81 MIL/uL   Hemoglobin 13.6 13.0 - 17.0 g/dL    HCT 41.2 39.0 - 52.0 %   MCV 88.4 80.0 - 100.0 fL   MCH 29.2 26.0 - 34.0 pg  MCHC 33.0 30.0 - 36.0 g/dL   RDW 13.6 11.5 - 15.5 %   Platelets 233 150 - 400 K/uL   nRBC 0.0 0.0 - 0.2 %  Troponin I (High Sensitivity)     Status: None   Collection Time: 05/29/21 10:14 PM  Result Value Ref Range   Troponin I (High Sensitivity) 8 <18 ng/L  D-dimer, quantitative     Status: Abnormal   Collection Time: 05/29/21 10:14 PM  Result Value Ref Range   D-Dimer, Quant 0.52 (H) 0.00 - 0.50 ug/mL-FEU  Brain natriuretic peptide     Status: None   Collection Time: 05/29/21 10:14 PM  Result Value Ref Range   B Natriuretic Peptide 20.5 0.0 - 100.0 pg/mL  Lipase, blood     Status: None   Collection Time: 05/29/21 10:14 PM  Result Value Ref Range   Lipase 51 11 - 51 U/L  Lactic acid, plasma     Status: Abnormal   Collection Time: 05/29/21 10:14 PM  Result Value Ref Range   Lactic Acid, Venous 3.0 (HH) 0.5 - 1.9 mmol/L  Hepatic function panel     Status: Abnormal   Collection Time: 05/29/21 10:14 PM  Result Value Ref Range   Total Protein 7.9 6.5 - 8.1 g/dL   Albumin 4.0 3.5 - 5.0 g/dL   AST 21 15 - 41 U/L   ALT 23 0 - 44 U/L   Alkaline Phosphatase 56 38 - 126 U/L   Total Bilirubin 0.2 (L) 0.3 - 1.2 mg/dL   Bilirubin, Direct <0.1 0.0 - 0.2 mg/dL   Indirect Bilirubin NOT CALCULATED 0.3 - 0.9 mg/dL  Resp Panel by RT-PCR (Flu A&B, Covid) Nasopharyngeal Swab     Status: None   Collection Time: 05/29/21 10:21 PM   Specimen: Nasopharyngeal Swab; Nasopharyngeal(NP) swabs in vial transport medium  Result Value Ref Range   SARS Coronavirus 2 by RT PCR NEGATIVE NEGATIVE   Influenza A by PCR NEGATIVE NEGATIVE   Influenza B by PCR NEGATIVE NEGATIVE  Lactic acid, plasma     Status: None   Collection Time: 05/30/21 12:14 AM  Result Value Ref Range   Lactic Acid, Venous 1.3 0.5 - 1.9 mmol/L  Lab review shows that patient had an elevated glucose, elevated D-dimer, elevated lactic acid of 3.0. Total bili of  0.2. Initial EKG shows sinus rhythm 79 with a left bundle branch block normal axis and some nonspecific T wave abnormalities. > CTA chest: FINDINGS: Cardiovascular: Satisfactory opacification of the pulmonary arteries to the segmental level. No evidence of pulmonary embolism. Nor nonaneurysmal aorta. Normal cardiac size. No pericardial effusion   Mediastinum/Nodes: Midline trachea. No thyroid mass. No suspicious lymph nodes. Esophagus within normal limits   Lungs/Pleura: Ground-glass density in the right upper lobe as before with mild distortion likely due to fibrosis. No consolidative airspace disease, pleural effusion, or pneumothorax   Upper Abdomen: No acute abnormality.   Musculoskeletal: No chest wall abnormality. No acute or significant osseous findings. Chronic left-sided rib deformities.   Review of the MIP images confirms the above findings.   IMPRESSION: 1. Negative for acute pulmonary embolus. 2. No acute airspace disease. Chronic ground-glass density at the right apex, possible scarring.    Assessment and Plan: * Chest pain Patient coming to [place] with midsternal chest pain that is been going on for the past 3 days with a history of abnormal EKG.  CT angio negative for any pericardial effusion normal cardiac size, no PE. We will admit to cardiac  telemetry unit with continuous cardiac monitoring. Differentials include cardiac versus GI related chest pain. We will obtain a 2D echocardiogram and schedule patient for stress test after keeping n.p.o. for midnight. We will cycle troponin and cardiology consult as deemed appropriate per a.m. team.   GERD (gastroesophageal reflux disease) GERD and underlying PUD is also a differential. We will start patient on IV PPI therapy.   Centrilobular emphysema (HCC) Stable. We will continue patient on his as needed MDI with Ventolin.  OSA (obstructive sleep apnea) N.p.o. except sips of meds. Continue patient on CPAP per home  settings.  Schizophrenia [facility]) Patient has history of schizophrenia. patient is currently cooperative and answers questions. We will continue patient on fluoxetine and Abilify.  Type 2 diabetes mellitus with other specified complication (HCC) We will hold home regimen of regular insulin. We will hold patient's metformin. We will keep patient n.p.o. Accu-Cheks every 6 hours and sliding scale insulin per glycemic regimen.  Hyperlipidemia associated with type 2 diabetes mellitus (HCC) A.m. lipid panel, will continue patient on atorvastatin 40 mg.    Advance Care Planning:   Code Status: Full Code   Consults: None   Family Communication:   Wentling,[name] L (Spouse)  [phone] (Mobile)    Severity of Illness: The appropriate patient status for this patient is OBSERVATION. Observation status is judged to be reasonable and necessary in order to provide the required intensity of service to ensure the patient's safety. The patient's presenting symptoms, physical exam findings, and initial radiographic and laboratory data in the context of their medical condition is felt to place them at decreased risk for further clinical deterioration. Furthermore, it is anticipated that the patient will be medically stable for discharge from the hospital within 2 midnights of admission.   Author: Para Skeans, MD 05/30/2021 1:21 AM  For on call review [email or web address].

## 2021-05-30 NOTE — Assessment & Plan Note (Addendum)
Patient has history of schizophrenia. ?patient is currently cooperative and answers questions. ?We will continue patient on fluoxetine and Abilify. ?

## 2021-05-30 NOTE — Assessment & Plan Note (Signed)
GERD and underlying PUD is also a differential. ?We will start patient on IV PPI therapy. ? ?

## 2021-05-30 NOTE — Assessment & Plan Note (Signed)
We will hold home regimen of regular insulin. ?We will hold patient's metformin. ?We will keep patient n.p.o. Accu-Cheks every 6 hours and sliding scale insulin per glycemic regimen. ?

## 2021-05-30 NOTE — Assessment & Plan Note (Signed)
A.m. lipid panel, will continue patient on atorvastatin 40 mg. ?

## 2021-05-30 NOTE — Consult Note (Signed)
Cardiology Consultation:   Patient ID: NAHZIR POHLE MRN: [id number]; DOB: March 14, 1971  Admit date: 05/29/2021 Date of Consult: 05/30/2021  PCP:  Olin Hauser, DO   [place] Providers Cardiologist:New  Patient Profile:   Randy [name] is a 51 y.o. male with a hx of DM2, emphysema, schizophrenia, OSA not on CPAP, HLD who is being seen 05/30/2021 for the evaluation of chest pain at the request of Dr. Bonner Puna.  History of Present Illness:   Mr. Rago  with prior CAD history. He chews tobacco. No alcohol or drug use. He lives across the street from his wife, they are separated. She helps to take care of him. Patient does not work.   The patient presetned to the ER 3/6 for chest pain. It started Sunday, has been constant. Thought it was GERD from the [place]. On Monday chest pain became worse. He felt a throbbing pain. Reported he was pale and had chills with shortness of breath. EMS was called. He received NTG x2 and this improved the pain. No recent fever, LLE, orthopnea or pnd.   In the ER BP 146/85, pulse 88, RR 20, afebrile, 95%O2. CR negative. Labs showed K3.9, Scr 0.98, BUN 15, albumin 4.0, normal LFTs/lipase. BNP wnl. HS trop 8>10, WBC 5.8, Hgb 13.6. ddimer 0.52.Respiratory panell negative. CXR without acute process. CTA chest negative for PE. EKG with LBBB and inferolateral TWI, old. The  The patient was admitted for further work-up.   He has dull chest pain, 6/10, but appears very comfortable.   Past Medical History:  Diagnosis Date   Allergy    Bipolar 1 disorder ([place])    COPD (chronic obstructive pulmonary disease) (HCC)    GERD (gastroesophageal reflux disease)     History reviewed. No pertinent surgical history.   Home Medications:  Prior to Admission medications   Medication Sig Start Date End Date Taking? Authorizing Provider  albuterol (PROAIR HFA) 108 (90 Base) MCG/ACT inhaler Inhale 1-2 puffs into the lungs every 6 (six) hours as needed for wheezing or  shortness of breath. 11/08/20  Yes Karamalegos, Devonne Doughty, DO  ARIPiprazole (ABILIFY) 10 MG tablet Take 20 mg by mouth daily. 05/02/20 05/30/21 Yes [provider]  atorvastatin (LIPITOR) 20 MG tablet Take 1 tablet (20 mg total) by mouth daily. 11/08/20  Yes Karamalegos, Devonne Doughty, DO  cetirizine (ZYRTEC) 10 MG tablet Take 1 tablet (10 mg total) by mouth daily. 11/04/17  Yes Karamalegos, Devonne Doughty, DO  doxepin (SINEQUAN) 10 MG capsule Take 20 mg by mouth at bedtime. 12/23/19  Yes [provider]  FLUoxetine (PROZAC) 10 MG capsule Take 10 mg by mouth daily. 05/07/21  Yes [provider]  FLUoxetine (PROZAC) 40 MG capsule Take 40 mg by mouth daily. Total dose of 50 mg daily. 04/16/21  Yes [provider]  metFORMIN (GLUCOPHAGE-XR) 500 MG 24 hr tablet Take 1 tablet (500 mg total) by mouth 2 (two) times daily with a meal. 11/08/20  Yes Karamalegos, Devonne Doughty, DO  omeprazole (PRILOSEC OTC) 20 MG tablet Take 1 tablet (20 mg total) by mouth daily. 05/01/21 05/31/21 Yes Carrie Mew, MD  ondansetron (ZOFRAN ODT) 4 MG disintegrating tablet Allow 1-2 tablets to dissolve in your mouth every 8 hours as needed for nausea/vomiting Patient not taking: Reported on 05/30/2021 10/14/20   Hinda Kehr, MD    Inpatient Medications: Scheduled Meds:  [START ON 05/31/2021] aspirin EC  81 mg Oral Daily   atorvastatin  40 mg Oral Daily   heparin  5,000 Units Subcutaneous Q8H   insulin aspart  0-6 Units Subcutaneous TID WC   sodium chloride flush  3 mL Intravenous Q12H   Continuous Infusions:  sodium chloride     sodium chloride 20 mL/hr at 05/30/21 0202   PRN Meds: sodium chloride, acetaminophen, nitroGLYCERIN, ondansetron (ZOFRAN) IV, sodium chloride flush  Allergies:   No Known Allergies  Social History:   Social History   Socioeconomic History   Marital status: Married    Spouse name: Not on file   Number of children: Not on file   Years of education: Not on file    Highest education level: 11th grade  Occupational History   Occupation: disability   Tobacco Use   Smoking status: Former    Packs/day: 0.00    Years: 0.00    Pack years: 0.00    Types: Cigarettes   Smokeless tobacco: Current    Types: Chew  Vaping Use   Vaping Use: Never used  Substance and Sexual Activity   Alcohol use: No   Drug use: Never   Sexual activity: Yes  Other Topics Concern   Not on file  Social History Narrative   Ongoing financial constraints mentioned during phone call today   Social Determinants of Health   Financial Resource Strain: Low Risk    Difficulty of Paying Living Expenses: Not hard at all  Food Insecurity: No Food Insecurity   Worried About Charity fundraiser in the Last Year: Never true   Ran Out of Food in the Last Year: Never true  Transportation Needs: No Transportation Needs   Lack of Transportation (Medical): No   Lack of Transportation (Non-Medical): No  Physical Activity: Insufficiently Active   Days of Exercise per Week: 3 days   Minutes of Exercise per Session: 40 min  Stress: No Stress Concern Present   Feeling of Stress : Not at all  Social Connections: Not on file  Intimate Partner Violence: Not on file    Family History:    Family History  Problem Relation Age of Onset   Alcohol abuse Mother    Cancer Father        colon    Colon cancer Father      ROS:  Please see the history of present illness.   All other ROS reviewed and negative.     Physical Exam/Data:   Vitals:   05/29/21 2217 05/29/21 2230 05/29/21 2330 05/30/21 0400  BP:  127/70 127/67 131/77  Pulse:  75 73 61  Resp:  (!) 25 (!) 23 19  Temp:      TempSrc:      SpO2:  94% 96% 96%  Weight: 111.1 kg     Height: '5\' 11"'$  (1.803 m)       Intake/Output Summary (Last 24 hours) at 05/30/2021 0756 Last data filed at 05/30/2021 0404 Gross per 24 hour  Intake 1000 ml  Output 1000 ml  Net 0 ml   Last 3 Weights 05/29/2021 05/01/2021 11/08/2020  Weight (lbs) 244 lb  14.9 oz 245 lb 238 lb 6.4 oz  Weight (kg) 111.1 kg 111.131 kg 108.138 kg     Body mass index is 34.16 kg/m.  General:  Well nourished, well developed, in no acute distress HEENT: normal Neck: no JVD Vascular: No carotid bruits; Distal pulses 2+ bilaterally Cardiac:  normal S1, S2; RRR; no murmur  Lungs:  clear to auscultation bilaterally, no wheezing, rhonchi or rales  Abd: soft, nontender, no hepatomegaly  Ext: no  edema Musculoskeletal:  No deformities, BUE and BLE strength normal and equal Skin: warm and dry  Neuro:  CNs 2-12 intact, no focal abnormalities noted Psych:  Normal affect   EKG:  The EKG was personally reviewed and demonstrates:  NSR 79bpm, LBBB, inferolateral TWI Telemetry:  Telemetry was personally reviewed and demonstrates:  NSR, PVCs, vent bigeminy, HR 70s  Relevant CV Studies:  Echo and Myoview Lexiscan ordered  Laboratory Data:  High Sensitivity Troponin:   Recent Labs  Lab 05/01/21 2059 05/29/21 2214 05/30/21 0203  TROPONINIHS '11 8 10     '$ Chemistry Recent Labs  Lab 05/29/21 2214 05/30/21 0203  NA 139 139  K 3.9 4.1  CL 101 106  CO2 26 27  GLUCOSE 181* 100*  BUN 15 15  CREATININE 0.98 0.90  CALCIUM 9.5 8.9  GFRNONAA >60 >60  ANIONGAP 12 6    Recent Labs  Lab 05/29/21 2214  PROT 7.9  ALBUMIN 4.0  AST 21  ALT 23  ALKPHOS 56  BILITOT 0.2*   Lipids  Recent Labs  Lab 05/30/21 0203  CHOL 135  TRIG 203*  HDL 45  LDLCALC 49  CHOLHDL 3.0    Hematology Recent Labs  Lab 05/29/21 2214 05/30/21 0203  WBC 5.8 5.3  RBC 4.66 4.12*  HGB 13.6 12.1*  HCT 41.2 36.8*  MCV 88.4 89.3  MCH 29.2 29.4  MCHC 33.0 32.9  RDW 13.6 13.5  PLT 233 205   Thyroid  Recent Labs  Lab 05/30/21 0203  TSH 2.440  FREET4 0.80    BNP Recent Labs  Lab 05/29/21 2214  BNP 20.5    DDimer  Recent Labs  Lab 05/29/21 2214  DDIMER 0.52*     Radiology/Studies:  CT Angio Chest PE W and/or Wo Contrast  Result Date: 05/29/2021 CLINICAL DATA:   Intermittent chest pain EXAM: CT ANGIOGRAPHY CHEST WITH CONTRAST TECHNIQUE: Multidetector CT imaging of the chest was performed using the standard protocol during bolus administration of intravenous contrast. Multiplanar CT image reconstructions and MIPs were obtained to evaluate the vascular anatomy. RADIATION DOSE REDUCTION: This exam was performed according to the departmental dose-optimization program which includes automated exposure control, adjustment of the mA and/or kV according to patient size and/or use of iterative reconstruction technique. CONTRAST:  122m OMNIPAQUE IOHEXOL 350 MG/ML SOLN COMPARISON:  Chest x-ray 05/29/2021, CT chest 10/26/2013 FINDINGS: Cardiovascular: Satisfactory opacification of the pulmonary arteries to the segmental level. No evidence of pulmonary embolism. Nor nonaneurysmal aorta. Normal cardiac size. No pericardial effusion Mediastinum/Nodes: Midline trachea. No thyroid mass. No suspicious lymph nodes. Esophagus within normal limits Lungs/Pleura: Ground-glass density in the right upper lobe as before with mild distortion likely due to fibrosis. No consolidative airspace disease, pleural effusion, or pneumothorax Upper Abdomen: No acute abnormality. Musculoskeletal: No chest wall abnormality. No acute or significant osseous findings. Chronic left-sided rib deformities. Review of the MIP images confirms the above findings. IMPRESSION: 1. Negative for acute pulmonary embolus. 2. No acute airspace disease. Chronic ground-glass density at the right apex, possible scarring. Electronically Signed   By: KDonavan FoilM.D.   On: 05/29/2021 23:35   DG Chest Portable 1 View  Result Date: 05/29/2021 CLINICAL DATA:  Chest pain. EXAM: PORTABLE CHEST 1 VIEW COMPARISON:  05/01/2021 FINDINGS: Lower lung volumes from prior exam. Normal heart size for technique. Stable mediastinal contours. No focal airspace disease, pleural effusion, pneumothorax or pulmonary edema. No acute osseous findings.  IMPRESSION: Low lung volumes without acute abnormality. Electronically Signed   By:  Keith Rake M.D.   On: 05/29/2021 22:12     Assessment and Plan:   Chest pain - with mostly atypical features, although NTG did relieve the pain. May be GI in nature - HS trop negative x 2 - EKG with NSR an LBBB with TWI inferolteral leads - No prior h/o CAD but has RF - check A1c and lipid panel - started on Aspirin '81mg'$  daily and Lipitor '40mg'$  daily - echo ordered - Myoview lexiscan today  Murmur - echo as above   For questions or updates, please contact [place] Please consult [email or web address] for contact info under    Signed, Zaide Kardell Ninfa Meeker, PA-C  05/30/2021 7:56 AM

## 2021-05-30 NOTE — H&P (View-Only) (Signed)
Cardiology Consultation:   Patient ID: Randy [name] MRN: [id number]; DOB: 1970-08-23  Admit date: 05/29/2021 Date of Consult: 05/30/2021  PCP:  Randy [name]   Randy [name] Providers Cardiologist:New  Patient Profile:   Randy [name] is a 51 y.o. male with a hx of DM2, emphysema, schizophrenia, OSA not on CPAP, HLD who is being seen 05/30/2021 for the evaluation of chest pain at the request of Dr. Bonner [name].  History of Present Illness:   Randy [name]  with prior CAD history. He chews tobacco. No alcohol or drug use. He lives across the street from his wife, they are separated. She helps to take care of him. Patient does not work.   The patient presetned to the ER 3/6 for chest pain. It started Sunday, has been constant. Thought it was GERD from the [place]. On Monday chest pain became worse. He felt a throbbing pain. Reported he was pale and had chills with shortness of breath. EMS was called. He received NTG x2 and this improved the pain. No recent fever, LLE, orthopnea or pnd.   In the ER BP 146/85, pulse 88, RR 20, afebrile, 95%O2. CR negative. Labs showed K3.9, Scr 0.98, BUN 15, albumin 4.0, normal LFTs/lipase. BNP wnl. HS trop 8>10, WBC 5.8, Hgb 13.6. ddimer 0.52.Respiratory panell negative. CXR without acute process. CTA chest negative for PE. EKG with LBBB and inferolateral TWI, old. The  The patient was admitted for further work-up.   He has dull chest pain, 6/10, but appears very comfortable.   Past Medical History:  Diagnosis Date   Allergy    Bipolar 1 disorder ([place])    COPD (chronic obstructive pulmonary disease) (HCC)    GERD (gastroesophageal reflux disease)     History reviewed. No pertinent surgical history.   Home Medications:  Prior to Admission medications   Medication Sig Start Date End Date Taking? Authorizing Provider  albuterol (PROAIR HFA) 108 (90 Base) MCG/ACT inhaler Inhale 1-2 puffs into the lungs every 6 (six) hours as needed for wheezing or  shortness of breath. 11/08/20  Yes Randy [name]  ARIPiprazole (ABILIFY) 10 MG tablet Take 20 mg by mouth daily. 05/02/20 05/30/21 Yes Randy [name]  atorvastatin (LIPITOR) 20 MG tablet Take 1 tablet (20 mg total) by mouth daily. 11/08/20  Yes Randy [name]  cetirizine (ZYRTEC) 10 MG tablet Take 1 tablet (10 mg total) by mouth daily. 11/04/17  Yes Randy [name]  doxepin (SINEQUAN) 10 MG capsule Take 20 mg by mouth at bedtime. 12/23/19  Yes Randy [name]  FLUoxetine (PROZAC) 10 MG capsule Take 10 mg by mouth daily. 05/07/21  Yes Randy [name]  FLUoxetine (PROZAC) 40 MG capsule Take 40 mg by mouth daily. Total dose of 50 mg daily. 04/16/21  Yes Randy [name]  metFORMIN (GLUCOPHAGE-XR) 500 MG 24 hr tablet Take 1 tablet (500 mg total) by mouth 2 (two) times daily with a meal. 11/08/20  Yes Randy [name]  omeprazole (PRILOSEC OTC) 20 MG tablet Take 1 tablet (20 mg total) by mouth daily. 05/01/21 05/31/21 Yes Randy Mew, [name]  ondansetron (ZOFRAN ODT) 4 MG disintegrating tablet Allow 1-2 tablets to dissolve in your mouth every 8 hours as needed for nausea/vomiting Patient not taking: Reported on 05/30/2021 10/14/20   Randy Kehr, [name]    Inpatient Medications: Scheduled Meds:  [START ON 05/31/2021] aspirin EC  81 mg Oral Daily   atorvastatin  40 mg Oral Daily   heparin  5,000 Units Subcutaneous Q8H   insulin aspart  0-6 Units Subcutaneous TID WC   sodium chloride flush  3 mL Intravenous Q12H   Continuous Infusions:  sodium chloride     sodium chloride 20 mL/hr at 05/30/21 0202   PRN Meds: sodium chloride, acetaminophen, nitroGLYCERIN, ondansetron (ZOFRAN) IV, sodium chloride flush  Allergies:   No Known Allergies  Social History:   Social History   Socioeconomic History   Marital status: Married    Spouse name: Not on file   Number of children: Not on file   Years of education: Not on file    Highest education level: 11th grade  Occupational History   Occupation: disability   Tobacco Use   Smoking status: Former    Packs/day: 0.00    Years: 0.00    Pack years: 0.00    Types: Cigarettes   Smokeless tobacco: Current    Types: Chew  Vaping Use   Vaping Use: Never used  Substance and Sexual Activity   Alcohol use: No   Drug use: Never   Sexual activity: Yes  Other Topics Concern   Not on file  Social History Narrative   Ongoing financial constraints mentioned during phone call today   Social Determinants of Health   Financial Resource Strain: Low Risk    Difficulty of Paying Living Expenses: Not hard at all  Food Insecurity: No Food Insecurity   Worried About Charity fundraiser in the Last Year: Never true   Ran Out of Food in the Last Year: Never true  Transportation Needs: No Transportation Needs   Lack of Transportation (Medical): No   Lack of Transportation (Non-Medical): No  Physical Activity: Insufficiently Active   Days of Exercise per Week: 3 days   Minutes of Exercise per Session: 40 min  Stress: No Stress Concern Present   Feeling of Stress : Not at all  Social Connections: Not on file  Intimate Partner Violence: Not on file    Family History:    Family History  Problem Relation Age of Onset   Alcohol abuse Mother    Cancer Father        colon    Colon cancer Father      ROS:  Please see the history of present illness.   All other ROS reviewed and negative.     Physical Exam/Data:   Vitals:   05/29/21 2217 05/29/21 2230 05/29/21 2330 05/30/21 0400  BP:  127/70 127/67 131/77  Pulse:  75 73 61  Resp:  (!) 25 (!) 23 19  Temp:      TempSrc:      SpO2:  94% 96% 96%  Weight: 111.1 kg     Height: '5\' 11"'$  (1.803 m)       Intake/Output Summary (Last 24 hours) at 05/30/2021 0756 Last data filed at 05/30/2021 0404 Gross per 24 hour  Intake 1000 ml  Output 1000 ml  Net 0 ml   Last 3 Weights 05/29/2021 05/01/2021 11/08/2020  Weight (lbs) 244 lb  14.9 oz 245 lb 238 lb 6.4 oz  Weight (kg) 111.1 kg 111.131 kg 108.138 kg     Body mass index is 34.16 kg/m.  General:  Well nourished, well developed, in no acute distress HEENT: normal Neck: no JVD Vascular: No carotid bruits; Distal pulses 2+ bilaterally Cardiac:  normal S1, S2; RRR; no murmur  Lungs:  clear to auscultation bilaterally, no wheezing, rhonchi or rales  Abd: soft, nontender, no hepatomegaly  Ext: no  edema Musculoskeletal:  No deformities, BUE and BLE strength normal and equal Skin: warm and dry  Neuro:  CNs 2-12 intact, no focal abnormalities noted Psych:  Normal affect   EKG:  The EKG was personally reviewed and demonstrates:  NSR 79bpm, LBBB, inferolateral TWI Telemetry:  Telemetry was personally reviewed and demonstrates:  NSR, PVCs, vent bigeminy, HR 70s  Relevant CV Studies:  Echo and Myoview Lexiscan ordered  Laboratory Data:  High Sensitivity Troponin:   Recent Labs  Lab 05/01/21 2059 05/29/21 2214 05/30/21 0203  TROPONINIHS '11 8 10     '$ Chemistry Recent Labs  Lab 05/29/21 2214 05/30/21 0203  NA 139 139  K 3.9 4.1  CL 101 106  CO2 26 27  GLUCOSE 181* 100*  BUN 15 15  CREATININE 0.98 0.90  CALCIUM 9.5 8.9  GFRNONAA >60 >60  ANIONGAP 12 6    Recent Labs  Lab 05/29/21 2214  PROT 7.9  ALBUMIN 4.0  AST 21  ALT 23  ALKPHOS 56  BILITOT 0.2*   Lipids  Recent Labs  Lab 05/30/21 0203  CHOL 135  TRIG 203*  HDL 45  LDLCALC 49  CHOLHDL 3.0    Hematology Recent Labs  Lab 05/29/21 2214 05/30/21 0203  WBC 5.8 5.3  RBC 4.66 4.12*  HGB 13.6 12.1*  HCT 41.2 36.8*  MCV 88.4 89.3  MCH 29.2 29.4  MCHC 33.0 32.9  RDW 13.6 13.5  PLT 233 205   Thyroid  Recent Labs  Lab 05/30/21 0203  TSH 2.440  FREET4 0.80    BNP Recent Labs  Lab 05/29/21 2214  BNP 20.5    DDimer  Recent Labs  Lab 05/29/21 2214  DDIMER 0.52*     Radiology/Studies:  CT Angio Chest PE W and/or Wo Contrast  Result Date: 05/29/2021 CLINICAL DATA:   Intermittent chest pain EXAM: CT ANGIOGRAPHY CHEST WITH CONTRAST TECHNIQUE: Multidetector CT imaging of the chest was performed using the standard protocol during bolus administration of intravenous contrast. Multiplanar CT image reconstructions and MIPs were obtained to evaluate the vascular anatomy. RADIATION DOSE REDUCTION: This exam was performed according to the departmental dose-optimization program which includes automated exposure control, adjustment of the mA and/or kV according to patient size and/or use of iterative reconstruction technique. CONTRAST:  180m OMNIPAQUE IOHEXOL 350 MG/ML SOLN COMPARISON:  Chest x-ray 05/29/2021, CT chest 10/26/2013 FINDINGS: Cardiovascular: Satisfactory opacification of the pulmonary arteries to the segmental level. No evidence of pulmonary embolism. Nor nonaneurysmal aorta. Normal cardiac size. No pericardial effusion Mediastinum/Nodes: Midline trachea. No thyroid mass. No suspicious lymph nodes. Esophagus within normal limits Lungs/Pleura: Ground-glass density in the right upper lobe as before with mild distortion likely due to fibrosis. No consolidative airspace disease, pleural effusion, or pneumothorax Upper Abdomen: No acute abnormality. Musculoskeletal: No chest wall abnormality. No acute or significant osseous findings. Chronic left-sided rib deformities. Review of the MIP images confirms the above findings. IMPRESSION: 1. Negative for acute pulmonary embolus. 2. No acute airspace disease. Chronic ground-glass density at the right apex, possible scarring. Electronically Signed   By: KDonavan FoilM.D.   On: 05/29/2021 23:35   DG Chest Portable 1 View  Result Date: 05/29/2021 CLINICAL DATA:  Chest pain. EXAM: PORTABLE CHEST 1 VIEW COMPARISON:  05/01/2021 FINDINGS: Lower lung volumes from prior exam. Normal heart size for technique. Stable mediastinal contours. No focal airspace disease, pleural effusion, pneumothorax or pulmonary edema. No acute osseous findings.  IMPRESSION: Low lung volumes without acute abnormality. Electronically Signed   By:  Keith Rake M.D.   On: 05/29/2021 22:12     Assessment and Plan:   Chest pain - with mostly atypical features, although NTG did relieve the pain. May be GI in nature - HS trop negative x 2 - EKG with NSR an LBBB with TWI inferolteral leads - No prior h/o CAD but has RF - check A1c and lipid panel - started on Aspirin '81mg'$  daily and Lipitor '40mg'$  daily - echo ordered - Myoview lexiscan today  Murmur - echo as above   For questions or updates, please contact [facility] Please consult [email or web address] for contact info under    Signed, Claudie Brickhouse Ninfa Meeker, PA-C  05/30/2021 7:56 AM

## 2021-05-30 NOTE — Assessment & Plan Note (Addendum)
Patient coming to [place] with midsternal chest pain that is been going on for the past 3 days with a history of abnormal EKG.  CT angio negative for any pericardial effusion normal cardiac size, no PE. ?We will admit to cardiac telemetry unit with continuous cardiac monitoring. ?Differentials include cardiac versus GI related chest pain. ?We will obtain a 2D echocardiogram and schedule patient for stress test after keeping n.p.o. for midnight. ?We will cycle troponin and cardiology consult as deemed appropriate per a.m. team. ? ?

## 2021-05-30 NOTE — Progress Notes (Signed)
Pt up to floor via wc at approx 1945. A&O x4. RA. Denies CP overnight. Admission completed. Pt educated on POC w/ verbalized understanding. NPO @ MN for cath today 3/8. Pt oriented to room and use of call bell. Ambulating steadily around the room. Pt verbalized understanding to alert RN if he develops CP overnight. Full assessment per flowsheets. Medication administration per MAR. Comfort and safety maintained.  ?

## 2021-05-31 ENCOUNTER — Encounter
Admission: EM | Disposition: A | Discharge status: Home / Self Care | Payer: Self-pay | Source: Home / Self Care | Attending: Internal Medicine

## 2021-05-31 ENCOUNTER — Telehealth: Payer: Self-pay

## 2021-05-31 DIAGNOSIS — I429 Cardiomyopathy, unspecified: Secondary | ICD-10-CM | POA: Diagnosis not present

## 2021-05-31 DIAGNOSIS — J432 Centrilobular emphysema: Secondary | ICD-10-CM | POA: Diagnosis not present

## 2021-05-31 DIAGNOSIS — I5023 Acute on chronic systolic (congestive) heart failure: Secondary | ICD-10-CM | POA: Diagnosis not present

## 2021-05-31 DIAGNOSIS — Z7984 Long term (current) use of oral hypoglycemic drugs: Secondary | ICD-10-CM | POA: Diagnosis not present

## 2021-05-31 DIAGNOSIS — I2510 Atherosclerotic heart disease of native coronary artery without angina pectoris: Secondary | ICD-10-CM | POA: Diagnosis not present

## 2021-05-31 DIAGNOSIS — Z87891 Personal history of nicotine dependence: Secondary | ICD-10-CM | POA: Diagnosis not present

## 2021-05-31 DIAGNOSIS — I272 Pulmonary hypertension, unspecified: Secondary | ICD-10-CM | POA: Diagnosis not present

## 2021-05-31 DIAGNOSIS — G4733 Obstructive sleep apnea (adult) (pediatric): Secondary | ICD-10-CM | POA: Diagnosis not present

## 2021-05-31 DIAGNOSIS — I5020 Unspecified systolic (congestive) heart failure: Secondary | ICD-10-CM

## 2021-05-31 DIAGNOSIS — Z20822 Contact with and (suspected) exposure to covid-19: Secondary | ICD-10-CM | POA: Diagnosis not present

## 2021-05-31 DIAGNOSIS — Z794 Long term (current) use of insulin: Secondary | ICD-10-CM | POA: Diagnosis not present

## 2021-05-31 DIAGNOSIS — Z6834 Body mass index (BMI) 34.0-34.9, adult: Secondary | ICD-10-CM | POA: Diagnosis not present

## 2021-05-31 DIAGNOSIS — R9431 Abnormal electrocardiogram [ECG] [EKG]: Secondary | ICD-10-CM | POA: Diagnosis not present

## 2021-05-31 DIAGNOSIS — I447 Left bundle-branch block, unspecified: Secondary | ICD-10-CM | POA: Diagnosis not present

## 2021-05-31 DIAGNOSIS — Z79899 Other long term (current) drug therapy: Secondary | ICD-10-CM | POA: Diagnosis not present

## 2021-05-31 DIAGNOSIS — N5089 Other specified disorders of the male genital organs: Secondary | ICD-10-CM | POA: Diagnosis not present

## 2021-05-31 DIAGNOSIS — K219 Gastro-esophageal reflux disease without esophagitis: Secondary | ICD-10-CM | POA: Diagnosis not present

## 2021-05-31 DIAGNOSIS — R7989 Other specified abnormal findings of blood chemistry: Secondary | ICD-10-CM | POA: Diagnosis not present

## 2021-05-31 DIAGNOSIS — Z8572 Personal history of non-Hodgkin lymphomas: Secondary | ICD-10-CM | POA: Diagnosis not present

## 2021-05-31 DIAGNOSIS — E669 Obesity, unspecified: Secondary | ICD-10-CM | POA: Diagnosis not present

## 2021-05-31 DIAGNOSIS — R9439 Abnormal result of other cardiovascular function study: Secondary | ICD-10-CM | POA: Diagnosis not present

## 2021-05-31 DIAGNOSIS — F319 Bipolar disorder, unspecified: Secondary | ICD-10-CM | POA: Diagnosis not present

## 2021-05-31 DIAGNOSIS — R300 Dysuria: Secondary | ICD-10-CM

## 2021-05-31 DIAGNOSIS — E1169 Type 2 diabetes mellitus with other specified complication: Secondary | ICD-10-CM | POA: Diagnosis not present

## 2021-05-31 DIAGNOSIS — I428 Other cardiomyopathies: Secondary | ICD-10-CM | POA: Diagnosis not present

## 2021-05-31 DIAGNOSIS — E785 Hyperlipidemia, unspecified: Secondary | ICD-10-CM | POA: Diagnosis not present

## 2021-05-31 DIAGNOSIS — F200 Paranoid schizophrenia: Secondary | ICD-10-CM | POA: Diagnosis present

## 2021-05-31 DIAGNOSIS — R69 Illness, unspecified: Secondary | ICD-10-CM | POA: Diagnosis not present

## 2021-05-31 DIAGNOSIS — I420 Dilated cardiomyopathy: Secondary | ICD-10-CM | POA: Diagnosis not present

## 2021-05-31 DIAGNOSIS — R079 Chest pain, unspecified: Secondary | ICD-10-CM | POA: Diagnosis not present

## 2021-05-31 HISTORY — PX: RIGHT/LEFT HEART CATH AND CORONARY ANGIOGRAPHY: CATH118266

## 2021-05-31 LAB — CBC
HCT: 39.6 % (ref 39.0–52.0)
Hemoglobin: 13.1 g/dL (ref 13.0–17.0)
MCH: 28.8 pg (ref 26.0–34.0)
MCHC: 33.1 g/dL (ref 30.0–36.0)
MCV: 87 fL (ref 80.0–100.0)
Platelets: 243 10*3/uL (ref 150–400)
RBC: 4.55 MIL/uL (ref 4.22–5.81)
RDW: 13.5 % (ref 11.5–15.5)
WBC: 6.4 10*3/uL (ref 4.0–10.5)
nRBC: 0 % (ref 0.0–0.2)

## 2021-05-31 LAB — BASIC METABOLIC PANEL
Anion gap: 11 (ref 5–15)
BUN: 20 mg/dL (ref 6–20)
CO2: 26 mmol/L (ref 22–32)
Calcium: 9.7 mg/dL (ref 8.9–10.3)
Chloride: 101 mmol/L (ref 98–111)
Creatinine, Ser: 0.85 mg/dL (ref 0.61–1.24)
GFR, Estimated: >60 mL/min (ref >60)
Glucose, Bld: 105 mg/dL — ABNORMAL HIGH (ref 70–99)
Potassium: 4 mmol/L (ref 3.5–5.1)
Sodium: 138 mmol/L (ref 135–145)

## 2021-05-31 LAB — GLUCOSE, CAPILLARY
Glucose-Capillary: 122 mg/dL — ABNORMAL HIGH (ref 70–99)
Glucose-Capillary: 127 mg/dL — ABNORMAL HIGH (ref 70–99)
Glucose-Capillary: 129 mg/dL — ABNORMAL HIGH (ref 70–99)
Glucose-Capillary: 96 mg/dL (ref 70–99)
Glucose-Capillary: 97 mg/dL (ref 70–99)
Glucose-Capillary: 98 mg/dL (ref 70–99)

## 2021-05-31 LAB — HIV ANTIBODY (ROUTINE TESTING W REFLEX): HIV Screen 4th Generation wRfx: NONREACTIVE

## 2021-05-31 SURGERY — RIGHT/LEFT HEART CATH AND CORONARY ANGIOGRAPHY
Anesthesia: Moderate Sedation

## 2021-05-31 MED ORDER — MIDAZOLAM HCL 2 MG/2ML IJ SOLN
INTRAMUSCULAR | Status: DC | PRN
  Administered 2021-05-31: 1 mg via INTRAVENOUS

## 2021-05-31 MED ORDER — LIDOCAINE HCL 1 % IJ SOLN
INTRAMUSCULAR | Status: AC
  Filled 2021-05-31: qty 20

## 2021-05-31 MED ORDER — HEPARIN (PORCINE) IN NACL 1000-0.9 UT/500ML-% IV SOLN
INTRAVENOUS | Status: DC | PRN
  Administered 2021-05-31: 1000 mL

## 2021-05-31 MED ORDER — ENOXAPARIN SODIUM 40 MG/0.4ML IJ SOSY: 40.0000 mg | PREFILLED_SYRINGE | INTRAMUSCULAR | Status: DC

## 2021-05-31 MED ORDER — HEPARIN SODIUM (PORCINE) 1000 UNIT/ML IJ SOLN
INTRAMUSCULAR | Status: AC
  Filled 2021-05-31: qty 10

## 2021-05-31 MED ORDER — FENTANYL CITRATE (PF) 100 MCG/2ML IJ SOLN
INTRAMUSCULAR | Status: AC
  Filled 2021-05-31: qty 2

## 2021-05-31 MED ORDER — VERAPAMIL HCL 2.5 MG/ML IV SOLN
INTRAVENOUS | Status: AC
  Filled 2021-05-31: qty 2

## 2021-05-31 MED ORDER — HEPARIN (PORCINE) IN NACL 1000-0.9 UT/500ML-% IV SOLN
INTRAVENOUS | Status: AC
  Filled 2021-05-31: qty 1000

## 2021-05-31 MED ORDER — VERAPAMIL HCL 2.5 MG/ML IV SOLN
INTRAVENOUS | Status: DC | PRN
  Administered 2021-05-31: 2.5 mg via INTRA_ARTERIAL

## 2021-05-31 MED ORDER — ARIPIPRAZOLE 10 MG PO TABS
20.0000 mg | ORAL_TABLET | Freq: Every day | ORAL | Status: DC
  Administered 2021-05-31 – 2021-06-01 (×2): 20 mg via ORAL
  Filled 2021-05-31 (×2): qty 2

## 2021-05-31 MED ORDER — FLUOXETINE HCL 10 MG PO CAPS
10.0000 mg | ORAL_CAPSULE | Freq: Every day | ORAL | Status: DC
  Administered 2021-05-31 – 2021-06-01 (×2): 10 mg via ORAL
  Filled 2021-05-31 (×2): qty 1

## 2021-05-31 MED ORDER — HEPARIN SODIUM (PORCINE) 1000 UNIT/ML IJ SOLN
INTRAMUSCULAR | Status: DC | PRN
  Administered 2021-05-31: 5000 [IU] via INTRAVENOUS

## 2021-05-31 MED ORDER — SODIUM CHLORIDE 0.9% FLUSH: 3.0000 mL | INTRAVENOUS | Status: DC | PRN

## 2021-05-31 MED ORDER — SODIUM CHLORIDE 0.9% FLUSH
3.0000 mL | Freq: Two times a day (BID) | INTRAVENOUS | Status: DC
  Administered 2021-05-31 – 2021-06-01 (×3): 3 mL via INTRAVENOUS

## 2021-05-31 MED ORDER — ENOXAPARIN SODIUM 60 MG/0.6ML IJ SOSY
0.5000 mg/kg | PREFILLED_SYRINGE | INTRAMUSCULAR | Status: DC
  Administered 2021-06-01: 10:00:00 52.5 mg via SUBCUTANEOUS
  Filled 2021-05-31 (×2): qty 0.6

## 2021-05-31 MED ORDER — HYDRALAZINE HCL 20 MG/ML IJ SOLN: 10.0000 mg | INTRAMUSCULAR | Status: AC | PRN

## 2021-05-31 MED ORDER — LIDOCAINE HCL (PF) 1 % IJ SOLN
INTRAMUSCULAR | Status: DC | PRN
  Administered 2021-05-31 (×2): 2 mL

## 2021-05-31 MED ORDER — ASPIRIN 81 MG PO CHEW: 81.0000 mg | CHEWABLE_TABLET | ORAL | Status: DC

## 2021-05-31 MED ORDER — SODIUM CHLORIDE 0.9 % IV SOLN: INTRAVENOUS | Status: DC

## 2021-05-31 MED ORDER — IOHEXOL 300 MG/ML  SOLN
INTRAMUSCULAR | Status: DC | PRN
  Administered 2021-05-31: 28 mL

## 2021-05-31 MED ORDER — INSULIN ASPART 100 UNIT/ML IJ SOLN: 0.0000 [IU] | INTRAMUSCULAR | Status: DC

## 2021-05-31 MED ORDER — SODIUM CHLORIDE 0.9 % IV SOLN: 250.0000 mL | INTRAVENOUS | Status: DC | PRN

## 2021-05-31 MED ORDER — MIDAZOLAM HCL 2 MG/2ML IJ SOLN
INTRAMUSCULAR | Status: AC
  Filled 2021-05-31: qty 2

## 2021-05-31 MED ORDER — LABETALOL HCL 5 MG/ML IV SOLN: 10.0000 mg | INTRAVENOUS | Status: AC | PRN

## 2021-05-31 MED ORDER — SODIUM CHLORIDE 0.9% FLUSH: 3.0000 mL | Freq: Two times a day (BID) | INTRAVENOUS | Status: DC

## 2021-05-31 MED ORDER — ASPIRIN 81 MG PO CHEW
81.0000 mg | CHEWABLE_TABLET | ORAL | Status: AC
  Administered 2021-05-31: 81 mg via ORAL
  Filled 2021-05-31: qty 1

## 2021-05-31 MED ORDER — DOXEPIN HCL 10 MG PO CAPS
20.0000 mg | ORAL_CAPSULE | Freq: Every day | ORAL | Status: DC
  Administered 2021-05-31: 20 mg via ORAL
  Filled 2021-05-31 (×2): qty 2

## 2021-05-31 MED ORDER — FUROSEMIDE 40 MG PO TABS
40.0000 mg | ORAL_TABLET | Freq: Every day | ORAL | Status: DC
  Administered 2021-05-31: 40 mg via ORAL
  Filled 2021-05-31: qty 1

## 2021-05-31 MED ORDER — ASPIRIN EC 81 MG PO TBEC
81.0000 mg | DELAYED_RELEASE_TABLET | Freq: Every day | ORAL | Status: DC
  Administered 2021-06-01: 10:00:00 81 mg via ORAL
  Filled 2021-05-31: qty 1

## 2021-05-31 MED ORDER — FENTANYL CITRATE (PF) 100 MCG/2ML IJ SOLN
INTRAMUSCULAR | Status: DC | PRN
  Administered 2021-05-31: 25 ug via INTRAVENOUS

## 2021-05-31 SURGICAL SUPPLY — 13 items
CATH 5FR JL3.5 JR4 ANG PIG MP (CATHETERS) ×1 IMPLANT
CATH BALLN WEDGE 5F 110CM (CATHETERS) ×1 IMPLANT
DEVICE RAD COMP TR BAND LRG (VASCULAR PRODUCTS) ×1 IMPLANT
DRAPE BRACHIAL (DRAPES) ×2 IMPLANT
GLIDESHEATH SLEND SS 6F .021 (SHEATH) ×1 IMPLANT
GUIDEWIRE INQWIRE 1.5J.035X260 (WIRE) IMPLANT
INQWIRE 1.5J .035X260CM (WIRE) ×2
PACK CARDIAC CATH (CUSTOM PROCEDURE TRAY) ×2 IMPLANT
PAD ELECT DEFIB RADIOL ZOLL (MISCELLANEOUS) ×1 IMPLANT
PROTECTION STATION PRESSURIZED (MISCELLANEOUS) ×2
SET ATX SIMPLICITY (MISCELLANEOUS) ×1 IMPLANT
SHEATH GLIDE SLENDER 4/5FR (SHEATH) ×1 IMPLANT
STATION PROTECTION PRESSURIZED (MISCELLANEOUS) IMPLANT

## 2021-05-31 NOTE — Interval H&P Note (Signed)
History and Physical Interval Note: ? ?05/31/2021 ?10:48 AM ? ?BUCKY GRIGG  has presented today for surgery, with the diagnosis of chest pain, cardiomyopathy, and abnormal stress test.  The various methods of treatment have been discussed with the patient and family. After consideration of risks, benefits and other options for treatment, the patient has consented to  Procedure(s): ?RIGHT/LEFT HEART CATH AND CORONARY ANGIOGRAPHY (N/A) as a surgical intervention.  The patient's history has been reviewed, patient examined, no change in status, stable for surgery.  I have reviewed the patient's chart and labs.  Questions were answered to the patient's satisfaction.   ? ?Cath Lab Visit (complete for each Cath Lab visit) ? ?Clinical Evaluation Leading to the Procedure:  ? ?ACS: Yes.  (Unstable angina) ? ?Non-ACS:  N/A ? ? ?Kallan Merrick ? ? ?

## 2021-05-31 NOTE — Progress Notes (Signed)
Progress Note  Patient Name: Randy [name] Date of Encounter: 05/31/2021  [facility] Cardiologist: New - Sherrell Farish  Subjective   Patient feels well.  Chest pain resolved overnight.  No shortness of breath or palpitations.  Inpatient Medications    Scheduled Meds:  [MAR Hold] aspirin EC  81 mg Oral Daily   [MAR Hold] atorvastatin  40 mg Oral Daily   [START ON 06/01/2021] enoxaparin (LOVENOX) injection  0.5 mg/kg Subcutaneous Q24H   [MAR Hold] insulin aspart  0-6 Units Subcutaneous Q4H   [MAR Hold] losartan  25 mg Oral Daily   [MAR Hold] metoprolol succinate  12.5 mg Oral Daily   [MAR Hold] nicotine  21 mg Transdermal Daily   [MAR Hold] sodium chloride flush  3 mL Intravenous Q12H   sodium chloride flush  3 mL Intravenous Q12H   Continuous Infusions:  [MAR Hold] sodium chloride     sodium chloride     PRN Meds: [MAR Hold] sodium chloride, sodium chloride, [MAR Hold] acetaminophen, hydrALAZINE, labetalol, [MAR Hold] nitroGLYCERIN, [MAR Hold] ondansetron (ZOFRAN) IV, [MAR Hold] sodium chloride flush, sodium chloride flush   Vital Signs    Vitals:   05/31/21 1114 05/31/21 1119 05/31/21 1145 05/31/21 1200  BP: 127/76 118/69 121/75 121/73  Pulse: 64 68 70 62  Resp: '12 13 17 14  '$ Temp:      TempSrc:      SpO2: 96% 94% 94% 95%  Weight:      Height:        Intake/Output Summary (Last 24 hours) at 05/31/2021 1224 Last data filed at 05/31/2021 1025 Gross per 24 hour  Intake 1190.17 ml  Output 700 ml  Net 490.17 ml   Last 3 Weights 05/31/2021 05/31/2021 05/30/2021  Weight (lbs) 236 lb 1.6 oz 236 lb 1.6 oz 236 lb 1.6 oz  Weight (kg) 107.094 kg 107.094 kg 107.094 kg      Telemetry    Sinus rhythm during catheterization.  Overnight telemetry not available for review.- Personally Reviewed  ECG    No new tracing.  Physical Exam   GEN: No acute distress.   Neck: No JVD Cardiac: RRR, no murmurs, rubs, or gallops.  Respiratory: Clear to auscultation bilaterally. GI: Soft,  nontender, non-distended  MS: No edema; No deformity. Neuro:  Nonfocal  Psych: Normal affect   Labs    High Sensitivity Troponin:   Recent Labs  Lab 05/01/21 2059 05/29/21 2214 05/30/21 0203  TROPONINIHS '11 8 10     '$ Chemistry Recent Labs  Lab 05/29/21 2214 05/30/21 0203 05/31/21 0550  NA 139 139 138  K 3.9 4.1 4.0  CL 101 106 101  CO2 '26 27 26  '$ GLUCOSE 181* 100* 105*  BUN '15 15 20  '$ CREATININE 0.98 0.90 0.85  CALCIUM 9.5 8.9 9.7  PROT 7.9  --   --   ALBUMIN 4.0  --   --   AST 21  --   --   ALT 23  --   --   ALKPHOS 56  --   --   BILITOT 0.2*  --   --   GFRNONAA >60 >60 >60  ANIONGAP '12 6 11    '$ Lipids  Recent Labs  Lab 05/30/21 0203  CHOL 135  TRIG 203*  HDL 45  LDLCALC 49  CHOLHDL 3.0    Hematology Recent Labs  Lab 05/29/21 2214 05/30/21 0203 05/31/21 0550  WBC 5.8 5.3 6.4  RBC 4.66 4.12* 4.55  HGB 13.6 12.1* 13.1  HCT  41.2 36.8* 39.6  MCV 88.4 89.3 87.0  MCH 29.2 29.4 28.8  MCHC 33.0 32.9 33.1  RDW 13.6 13.5 13.5  PLT 233 205 243   Thyroid  Recent Labs  Lab 05/30/21 0203  TSH 2.440  FREET4 0.80    BNP Recent Labs  Lab 05/29/21 2214  BNP 20.5    DDimer  Recent Labs  Lab 05/29/21 2214  DDIMER 0.52*     Radiology    CT Angio Chest PE W and/or Wo Contrast  Result Date: 05/29/2021 CLINICAL DATA:  Intermittent chest pain EXAM: CT ANGIOGRAPHY CHEST WITH CONTRAST TECHNIQUE: Multidetector CT imaging of the chest was performed using the standard protocol during bolus administration of intravenous contrast. Multiplanar CT image reconstructions and MIPs were obtained to evaluate the vascular anatomy. RADIATION DOSE REDUCTION: This exam was performed according to the departmental dose-optimization program which includes automated exposure control, adjustment of the mA and/or kV according to patient size and/or use of iterative reconstruction technique. CONTRAST:  188m OMNIPAQUE IOHEXOL 350 MG/ML SOLN COMPARISON:  Chest x-ray 05/29/2021, CT  chest 10/26/2013 FINDINGS: Cardiovascular: Satisfactory opacification of the pulmonary arteries to the segmental level. No evidence of pulmonary embolism. Nor nonaneurysmal aorta. Normal cardiac size. No pericardial effusion Mediastinum/Nodes: Midline trachea. No thyroid mass. No suspicious lymph nodes. Esophagus within normal limits Lungs/Pleura: Ground-glass density in the right upper lobe as before with mild distortion likely due to fibrosis. No consolidative airspace disease, pleural effusion, or pneumothorax Upper Abdomen: No acute abnormality. Musculoskeletal: No chest wall abnormality. No acute or significant osseous findings. Chronic left-sided rib deformities. Review of the MIP images confirms the above findings. IMPRESSION: 1. Negative for acute pulmonary embolus. 2. No acute airspace disease. Chronic ground-glass density at the right apex, possible scarring. Electronically Signed   By: KDonavan FoilM.D.   On: 05/29/2021 23:35   CARDIAC CATHETERIZATION  Result Date: 05/31/2021 Conclusions: Mild, nonobstructive coronary artery disease with 15-20% stenosis involving the proximal LAD and ostial/proximal ramus intermedius. Upper normal to mildly elevated left heart filling pressures. Moderate-severely elevated right heart filling pressures. Mild pulmonary hypertension. Low normal to mildly reduced Fick cardiac output/index. Recommendations: Consider gentle diuresis in the setting of elevated filling pressures, as outlined above. Escalate goal-directed medical therapy for nonischemic cardiomyopathy. Encourage further work-up/management of sleep apnea, including compliance with CPAP. CNelva Bush MD [facility]  NM Myocar Multi W/Spect WTamela OddiMotion / EF  Result Date: 05/30/2021   Abnormal pharmacologic myocardial perfusion stress test.   There is a large in size, moderate in severity, fixed defect involving the entire septum as well as the apical, most likely representing LBBB artifact though scar  cannot be excluded.   There is no evidence of significant ischemia.   Left ventricular systolic function is moderately reduced (LVEF 40%) with global hypokinesis.   There is no significant coronary artery calcification.   This is an intermediate risk study.   DG Chest Portable 1 View  Result Date: 05/29/2021 CLINICAL DATA:  Chest pain. EXAM: PORTABLE CHEST 1 VIEW COMPARISON:  05/01/2021 FINDINGS: Lower lung volumes from prior exam. Normal heart size for technique. Stable mediastinal contours. No focal airspace disease, pleural effusion, pneumothorax or pulmonary edema. No acute osseous findings. IMPRESSION: Low lung volumes without acute abnormality. Electronically Signed   By: MKeith RakeM.D.   On: 05/29/2021 22:12   ECHOCARDIOGRAM COMPLETE  Result Date: 05/30/2021    ECHOCARDIOGRAM REPORT   Patient Name:   Randy HABIGDate of Exam: 05/30/2021 Medical Rec #:  [id number]      Height:       71.0 in Accession #:    [id number]     Weight:       244.9 lb Date of Birth:  11/01/1970       BSA:          2.298 m Patient Age:    64 years       BP:           130/87 mmHg Patient Gender: M              HR:           64 bpm. Exam Location:  [facility] Procedure: 2D Echo, Color Doppler, Cardiac Doppler and Intracardiac            Opacification Agent Indications:    R07.9 Chest Pain  History:        Patient has no prior history of Echocardiogram examinations.                 COPD.  Sonographer:    Charmayne Sheer Referring Phys: [place] Diagnosing      Nelva Bush MD Phys:  Sonographer Comments: Suboptimal apical window and no subcostal window. IMPRESSIONS  1. Left ventricular ejection fraction, by estimation, is 35 to 40%. The left ventricle has moderately decreased function. The left ventricle demonstrates global hypokinesis. Left ventricular diastolic parameters are consistent with Grade I diastolic dysfunction (impaired relaxation).  2. Right ventricular systolic function is normal. The right ventricular size  is normal. Tricuspid regurgitation signal is inadequate for assessing PA pressure.  3. The mitral valve is normal in structure. No evidence of mitral valve regurgitation.  4. Question restricted movement of the right coronary leaflet. The aortic valve is tricuspid. Aortic valve regurgitation is mild. No aortic stenosis is present.  5. The inferior vena cava is normal in size with greater than 50% respiratory variability, suggesting right atrial pressure of 3 mmHg. FINDINGS  Left Ventricle: Left ventricular ejection fraction, by estimation, is 35 to 40%. The left ventricle has moderately decreased function. The left ventricle demonstrates global hypokinesis. Definity contrast agent was given IV to delineate the left ventricular endocardial borders. The left ventricular internal cavity size was normal in size. There is borderline left ventricular hypertrophy. Abnormal (paradoxical) septal motion, consistent with left bundle branch block. Left ventricular diastolic parameters are consistent with Grade I diastolic dysfunction (impaired relaxation). Right Ventricle: The right ventricular size is normal. No increase in right ventricular wall thickness. Right ventricular systolic function is normal. Tricuspid regurgitation signal is inadequate for assessing PA pressure. Left Atrium: Left atrial size was normal in size. Right Atrium: Right atrial size was normal in size. Pericardium: There is no evidence of pericardial effusion. Mitral Valve: The mitral valve is normal in structure. There is mild thickening of the mitral valve leaflet(s). No evidence of mitral valve regurgitation. MV peak gradient, 3.0 mmHg. The mean mitral valve gradient is 2.0 mmHg. Tricuspid Valve: The tricuspid valve is grossly normal. Tricuspid valve regurgitation is not demonstrated. Aortic Valve: Question restricted movement of the right coronary leaflet. The aortic valve is tricuspid. Aortic valve regurgitation is mild. No aortic stenosis is  present. Aortic valve mean gradient measures 8.0 mmHg. Aortic valve peak gradient measures 12.7 mmHg. Aortic valve area, by VTI measures 1.67 cm. Pulmonic Valve: The pulmonic valve was grossly normal. Pulmonic valve regurgitation is not visualized. No evidence of pulmonic stenosis. Aorta: The aortic root and ascending aorta are structurally  normal, with no evidence of dilitation. Venous: The inferior vena cava is normal in size with greater than 50% respiratory variability, suggesting right atrial pressure of 3 mmHg. IAS/Shunts: The interatrial septum was not well visualized.  LEFT VENTRICLE PLAX 2D LVIDd:         5.31 cm   Diastology LVIDs:         3.99 cm   LV e' medial:    6.31 cm/s LV PW:         1.17 cm   LV E/e' medial:  11.9 LV IVS:        0.96 cm   LV e' lateral:   6.74 cm/s LVOT diam:     2.20 cm   LV E/e' lateral: 11.2 LV SV:         52 LV SV Index:   23 LVOT Area:     3.80 cm  RIGHT VENTRICLE RV Basal diam:  3.18 cm LEFT ATRIUM             Index        RIGHT ATRIUM           Index LA diam:        4.00 cm 1.74 cm/m   RA Area:     14.30 cm LA Vol (A2C):   46.9 ml 20.41 ml/m  RA Volume:   38.20 ml  16.62 ml/m LA Vol (A4C):   33.3 ml 14.49 ml/m LA Biplane Vol: 40.5 ml 17.62 ml/m  AORTIC VALVE                     PULMONIC VALVE AV Area (Vmax):    1.63 cm      PV Vmax:       1.38 m/s AV Area (Vmean):   1.61 cm      PV Vmean:      86.300 cm/s AV Area (VTI):     1.67 cm      PV VTI:        0.242 m AV Vmax:           178.00 cm/s   PV Peak grad:  7.6 mmHg AV Vmean:          130.000 cm/s  PV Mean grad:  3.0 mmHg AV VTI:            0.315 m AV Peak Grad:      12.7 mmHg AV Mean Grad:      8.0 mmHg LVOT Vmax:         76.40 cm/s LVOT Vmean:        55.100 cm/s LVOT VTI:          0.138 m LVOT/AV VTI ratio: 0.44  AORTA Ao Root diam: 3.20 cm MITRAL VALVE MV Area (PHT): 4.12 cm    SHUNTS MV Area VTI:   2.22 cm    Systemic VTI:  0.14 m MV Peak grad:  3.0 mmHg    Systemic Diam: 2.20 cm MV Mean grad:  2.0 mmHg MV  Vmax:       0.86 m/s MV Vmean:      60.6 cm/s MV Decel Time: 184 msec MV E velocity: 75.40 cm/s MV A velocity: 82.70 cm/s MV E/A ratio:  0.91 Harrell Gave Randy Guerrier MD Electronically signed by Nelva Bush MD Signature Date/Time: 05/30/2021/4:05:52 PM    Final     Cardiac Studies   See echo, stress test, and catheterization findings above.  Patient Profile     51  y.o. male with history of hyperlipidemia, type 2 diabetes mellitus, COPD, schizophrenia, and obstructive sleep apnea, admitted with atypical chest pain and found to have new cardiomyopathy and abnormal stress test in the setting of chronic LBBB.  Assessment & Plan    Chest pain: Chest pain has resolved.  Catheterization today showed nonobstructive CAD.  I suspect fixed defect on yesterday stress test is related to LBBB. -Medical therapy and risk factor modification to prevent progression of mild CAD noted on catheterization. -Continue metoprolol for potential component of microvascular dysfunction.  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  Catheterization today demonstrates nonobstructive CAD, consistent with nonischemic cardiomyopathy.  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  Left heart filling pressures were mildly elevated today.  Right heart filling pressures were moderately high. -Continue losartan and metoprolol succinate. -Add furosemide 40 mg p.o. daily. -Consider adding SGLT2 inhibitor and spironolactone at follow-up. -If LVEF declines further in spite of optimized GDMT, referral to EP for consideration of CRT will need to be pursued. -Needs to be compliant with CPAP; might need repeat sleep evaluation.  Hyperlipidemia: -Continue atorvastatin.  [facility] will sign off.   Medication Recommendations: Continue current doses of aspirin, atorvastatin, losartan, and metoprolol.  We will add furosemide 40  mg p.o. daily. Other recommendations (labs, testing, etc): Follow-up BMP in 1-2 weeks. Follow up as an outpatient: Follow-up with me or APP in the office in 1-2 weeks.  For questions or updates, please contact [place] Please consult [email or web address] for contact info under [facility].     Signed, Nelva Bush, MD  05/31/2021, 12:24 PM

## 2021-05-31 NOTE — Progress Notes (Addendum)
PROGRESS NOTE    Randy [name]  RJJ:[id number] DOB: [birth year] DOA: 05/29/2021 PCP: Olin Hauser, DO   Assessment & Plan:   Principal Problem:   Chest pain Active Problems:   GERD (gastroesophageal reflux disease)   Centrilobular emphysema (HCC)   OSA (obstructive sleep apnea)   Schizophrenia ([place])   Type 2 diabetes mellitus with other specified complication ([place])   Hyperlipidemia associated with type 2 diabetes mellitus (HCC)   Chest pain: troponins neg x 2. CTA chest was neg for PE. Continue on tele. Echo ordered. S/p cardiac stress test which showed LVEF 40% w/ fixed septal defect. S/p cardiac cath which non-obstructive disease as per cardio   Dysuria: UA and urine cx ordered.   Acute on chronic systolic CHF: & nonischemic cardiomyopathy w/ edematous scrotum. Started on lasix as per cardio. Continue on metoprolol, losartan. Monitor I/Os.   GERD: continue on PPI   Centrilobular emphysema: continue on bronchodilators.    OSA: continue on CPAP   Schizophrenia: paranoid as per pt's wife. Continue on home dose of fluoxetine, abilify    DM2: likely poorly controlled. Continue on SSI w/ accuchecks    HLD: continue on statin   Obesity: BMI 32.9. Would benefit from weight loss    DVT prophylaxis: lovenox Code Status: full  Family Communication: discussed pt's care w/ pt's family at bedside and answered their questions  Disposition Plan: likely d/c back home   Level of care: Telemetry Cardiac  Status is: Inpatient Remains inpatient appropriate because: c/o dysuria & swollen scrotum     Consultants:  Cardio   Procedures:  Antimicrobials:   Subjective: Pt c/o dysuria & swollen scrotum   Objective: Vitals:   05/31/21 0255 05/31/21 0420 05/31/21 0422 05/31/21 0720  BP: 107/66 (!) 95/55 111/68 104/64  Pulse: 62 (!) 59 (!) 58 62  Resp:  20 20   Temp:  97.7 F (36.5 C) 97.7 F (36.5 C) (!) 97.5 F (36.4 C)  TempSrc:  Oral    SpO2:  97% 97% 98%   Weight:   107.1 kg   Height:        Intake/Output Summary (Last 24 hours) at 05/31/2021 0733 Last data filed at 05/31/2021 0644 Gross per 24 hour  Intake 710.17 ml  Output 200 ml  Net 510.17 ml   Filed Weights   05/29/21 2217 05/30/21 1950 05/31/21 0422  Weight: 111.1 kg 107.1 kg 107.1 kg    Examination:  General exam: Appears calm and comfortable  Respiratory system: Clear to auscultation. Respiratory effort normal. Cardiovascular system: S1 & S2 +. No  rubs, gallops or clicks.  Gastrointestinal system: Abdomen is nondistended, soft and nontender.  Normal bowel sounds heard. Central nervous system: Alert and oriented. Moves all extremities Psychiatry: Judgement and insight appear poor. Flat mood and affect.     Data Reviewed: I have personally reviewed following labs and imaging studies  CBC: Recent Labs  Lab 05/29/21 2214 05/30/21 0203 05/31/21 0550  WBC 5.8 5.3 6.4  HGB 13.6 12.1* 13.1  HCT 41.2 36.8* 39.6  MCV 88.4 89.3 87.0  PLT 233 205 016   Basic Metabolic Panel: Recent Labs  Lab 05/29/21 2214 05/30/21 0203 05/31/21 0550  NA 139 139 138  K 3.9 4.1 4.0  CL 101 106 101  CO2 '26 27 26  '$ GLUCOSE 181* 100* 105*  BUN '15 15 20  '$ CREATININE 0.98 0.90 0.85  CALCIUM 9.5 8.9 9.7   GFR: Estimated Creatinine Clearance: 128 mL/min (by C-G formula based on  SCr of 0.85 mg/dL). Liver Function Tests: Recent Labs  Lab 05/29/21 2214  AST 21  ALT 23  ALKPHOS 56  BILITOT 0.2*  PROT 7.9  ALBUMIN 4.0   Recent Labs  Lab 05/29/21 2214  LIPASE 51   No results for input(s): AMMONIA in the last 168 hours. Coagulation Profile: No results for input(s): INR, PROTIME in the last 168 hours. Cardiac Enzymes: No results for input(s): CKTOTAL, CKMB, CKMBINDEX, TROPONINI in the last 168 hours. BNP (last 3 results) No results for input(s): PROBNP in the last 8760 hours. HbA1C: Recent Labs    05/30/21 0203  HGBA1C 6.2*   CBG: Recent Labs  Lab 05/30/21 0742  05/30/21 1228 05/30/21 1953 05/30/21 2355 05/31/21 0509  GLUCAP 102* 95 154* 86 98   Lipid Profile: Recent Labs    05/30/21 0203  CHOL 135  HDL 45  LDLCALC 49  TRIG 203*  CHOLHDL 3.0   Thyroid Function Tests: Recent Labs    05/30/21 0203  TSH 2.440  FREET4 0.80   Anemia Panel: No results for input(s): VITAMINB12, FOLATE, FERRITIN, TIBC, IRON, RETICCTPCT in the last 72 hours. Sepsis Labs: Recent Labs  Lab 05/29/21 2214 05/30/21 0014  LATICACIDVEN 3.0* 1.3    Recent Results (from the past 240 hour(s))  Resp Panel by RT-PCR (Flu A&B, Covid) Nasopharyngeal Swab     Status: None   Collection Time: 05/29/21 10:21 PM   Specimen: Nasopharyngeal Swab; Nasopharyngeal(NP) swabs in vial transport medium  Result Value Ref Range Status   SARS Coronavirus 2 by RT PCR NEGATIVE NEGATIVE Final    Comment: (NOTE) SARS-CoV-2 target nucleic acids are NOT DETECTED.  The SARS-CoV-2 RNA is generally detectable in upper respiratory specimens during the acute phase of infection. The lowest concentration of SARS-CoV-2 viral copies this assay can detect is 138 copies/mL. A negative result does not preclude SARS-Cov-2 infection and should not be used as the sole basis for treatment or other patient management decisions. A negative result may occur with  improper specimen collection/handling, submission of specimen other than nasopharyngeal swab, presence of viral mutation(s) within the areas targeted by this assay, and inadequate number of viral copies(<138 copies/mL). A negative result must be combined with clinical observations, patient history, and epidemiological information. The expected result is Negative.  Fact Sheet for Patients:  EntrepreneurPulse.com.au  Fact Sheet for Healthcare Providers:  IncredibleEmployment.be  This test is no t yet approved or cleared by the [place] FDA and  has been authorized for detection and/or diagnosis  of SARS-CoV-2 by FDA under an Emergency Use Authorization (EUA). This EUA will remain  in effect (meaning this test can be used) for the duration of the COVID-19 declaration under Section 564(b)(1) of the Act, 21 U.S.C.section 360bbb-3(b)(1), unless the authorization is terminated  or revoked sooner.       Influenza A by PCR NEGATIVE NEGATIVE Final   Influenza B by PCR NEGATIVE NEGATIVE Final    Comment: (NOTE) The Xpert Xpress SARS-CoV-2/FLU/RSV plus assay is intended as an aid in the diagnosis of influenza from Nasopharyngeal swab specimens and should not be used as a sole basis for treatment. Nasal washings and aspirates are unacceptable for Xpert Xpress SARS-CoV-2/FLU/RSV testing.  Fact Sheet for Patients: EntrepreneurPulse.com.au  Fact Sheet for Healthcare Providers: IncredibleEmployment.be  This test is not yet approved or cleared by the [place] FDA and has been authorized for detection and/or diagnosis of SARS-CoV-2 by FDA under an Emergency Use Authorization (EUA). This EUA will remain in effect (meaning  this test can be used) for the duration of the COVID-19 declaration under Section 564(b)(1) of the Act, 21 U.S.C. section 360bbb-3(b)(1), unless the authorization is terminated or revoked.  Performed at [facility], [address] [place], [place] [address]          Radiology Studies: CT Angio Chest PE W and/or Wo Contrast  Result Date: 05/29/2021 CLINICAL DATA:  Intermittent chest pain EXAM: CT ANGIOGRAPHY CHEST WITH CONTRAST TECHNIQUE: Multidetector CT imaging of the chest was performed using the standard protocol during bolus administration of intravenous contrast. Multiplanar CT image reconstructions and MIPs were obtained to evaluate the vascular anatomy. RADIATION DOSE REDUCTION: This exam was performed according to the departmental dose-optimization program which includes automated exposure control,  adjustment of the mA and/or kV according to patient size and/or use of iterative reconstruction technique. CONTRAST:  138m OMNIPAQUE IOHEXOL 350 MG/ML SOLN COMPARISON:  Chest x-ray 05/29/2021, CT chest 10/26/2013 FINDINGS: Cardiovascular: Satisfactory opacification of the pulmonary arteries to the segmental level. No evidence of pulmonary embolism. Nor nonaneurysmal aorta. Normal cardiac size. No pericardial effusion Mediastinum/Nodes: Midline trachea. No thyroid mass. No suspicious lymph nodes. Esophagus within normal limits Lungs/Pleura: Ground-glass density in the right upper lobe as before with mild distortion likely due to fibrosis. No consolidative airspace disease, pleural effusion, or pneumothorax Upper Abdomen: No acute abnormality. Musculoskeletal: No chest wall abnormality. No acute or significant osseous findings. Chronic left-sided rib deformities. Review of the MIP images confirms the above findings. IMPRESSION: 1. Negative for acute pulmonary embolus. 2. No acute airspace disease. Chronic ground-glass density at the right apex, possible scarring. Electronically Signed   By: KDonavan FoilM.D.   On: 05/29/2021 23:35   NM Myocar Multi W/Spect W/Wall Motion / EF  Result Date: 05/30/2021   Abnormal pharmacologic myocardial perfusion stress test.   There is a large in size, moderate in severity, fixed defect involving the entire septum as well as the apical, most likely representing LBBB artifact though scar cannot be excluded.   There is no evidence of significant ischemia.   Left ventricular systolic function is moderately reduced (LVEF 40%) with global hypokinesis.   There is no significant coronary artery calcification.   This is an intermediate risk study.   DG Chest Portable 1 View  Result Date: 05/29/2021 CLINICAL DATA:  Chest pain. EXAM: PORTABLE CHEST 1 VIEW COMPARISON:  05/01/2021 FINDINGS: Lower lung volumes from prior exam. Normal heart size for technique. Stable mediastinal contours. No  focal airspace disease, pleural effusion, pneumothorax or pulmonary edema. No acute osseous findings. IMPRESSION: Low lung volumes without acute abnormality. Electronically Signed   By: MKeith RakeM.D.   On: 05/29/2021 22:12   ECHOCARDIOGRAM COMPLETE  Result Date: 05/30/2021    ECHOCARDIOGRAM REPORT   Patient Name:   KABEM SHADDIXDate of Exam: 05/30/2021 Medical Rec #:  [id number]     Height:       71.0 in Accession #:    [id number]    Weight:       244.9 lb Date of Birth:  7May 04, 1972      BSA:          2.298 m Patient Age:    89years       BP:           130/87 mmHg Patient Gender: M              HR:           64 bpm.  Exam Location:  [facility] Procedure: 2D Echo, Color Doppler, Cardiac Doppler and Intracardiac            Opacification Agent Indications:    R07.9 Chest Pain  History:        Patient has no prior history of Echocardiogram examinations.                 COPD.  Sonographer:    Charmayne Sheer Referring Phys: [place] Diagnosing      Nelva Bush MD Phys:  Sonographer Comments: Suboptimal apical window and no subcostal window. IMPRESSIONS  1. Left ventricular ejection fraction, by estimation, is 35 to 40%. The left ventricle has moderately decreased function. The left ventricle demonstrates global hypokinesis. Left ventricular diastolic parameters are consistent with Grade I diastolic dysfunction (impaired relaxation).  2. Right ventricular systolic function is normal. The right ventricular size is normal. Tricuspid regurgitation signal is inadequate for assessing PA pressure.  3. The mitral valve is normal in structure. No evidence of mitral valve regurgitation.  4. Question restricted movement of the right coronary leaflet. The aortic valve is tricuspid. Aortic valve regurgitation is mild. No aortic stenosis is present.  5. The inferior vena cava is normal in size with greater than 50% respiratory variability, suggesting right atrial pressure of 3 mmHg. FINDINGS  Left Ventricle: Left  ventricular ejection fraction, by estimation, is 35 to 40%. The left ventricle has moderately decreased function. The left ventricle demonstrates global hypokinesis. Definity contrast agent was given IV to delineate the left ventricular endocardial borders. The left ventricular internal cavity size was normal in size. There is borderline left ventricular hypertrophy. Abnormal (paradoxical) septal motion, consistent with left bundle branch block. Left ventricular diastolic parameters are consistent with Grade I diastolic dysfunction (impaired relaxation). Right Ventricle: The right ventricular size is normal. No increase in right ventricular wall thickness. Right ventricular systolic function is normal. Tricuspid regurgitation signal is inadequate for assessing PA pressure. Left Atrium: Left atrial size was normal in size. Right Atrium: Right atrial size was normal in size. Pericardium: There is no evidence of pericardial effusion. Mitral Valve: The mitral valve is normal in structure. There is mild thickening of the mitral valve leaflet(s). No evidence of mitral valve regurgitation. MV peak gradient, 3.0 mmHg. The mean mitral valve gradient is 2.0 mmHg. Tricuspid Valve: The tricuspid valve is grossly normal. Tricuspid valve regurgitation is not demonstrated. Aortic Valve: Question restricted movement of the right coronary leaflet. The aortic valve is tricuspid. Aortic valve regurgitation is mild. No aortic stenosis is present. Aortic valve mean gradient measures 8.0 mmHg. Aortic valve peak gradient measures 12.7 mmHg. Aortic valve area, by VTI measures 1.67 cm. Pulmonic Valve: The pulmonic valve was grossly normal. Pulmonic valve regurgitation is not visualized. No evidence of pulmonic stenosis. Aorta: The aortic root and ascending aorta are structurally normal, with no evidence of dilitation. Venous: The inferior vena cava is normal in size with greater than 50% respiratory variability, suggesting right atrial  pressure of 3 mmHg. IAS/Shunts: The interatrial septum was not well visualized.  LEFT VENTRICLE PLAX 2D LVIDd:         5.31 cm   Diastology LVIDs:         3.99 cm   LV e' medial:    6.31 cm/s LV PW:         1.17 cm   LV E/e' medial:  11.9 LV IVS:        0.96 cm   LV e' lateral:  6.74 cm/s LVOT diam:     2.20 cm   LV E/e' lateral: 11.2 LV SV:         52 LV SV Index:   23 LVOT Area:     3.80 cm  RIGHT VENTRICLE RV Basal diam:  3.18 cm LEFT ATRIUM             Index        RIGHT ATRIUM           Index LA diam:        4.00 cm 1.74 cm/m   RA Area:     14.30 cm LA Vol (A2C):   46.9 ml 20.41 ml/m  RA Volume:   38.20 ml  16.62 ml/m LA Vol (A4C):   33.3 ml 14.49 ml/m LA Biplane Vol: 40.5 ml 17.62 ml/m  AORTIC VALVE                     PULMONIC VALVE AV Area (Vmax):    1.63 cm      PV Vmax:       1.38 m/s AV Area (Vmean):   1.61 cm      PV Vmean:      86.300 cm/s AV Area (VTI):     1.67 cm      PV VTI:        0.242 m AV Vmax:           178.00 cm/s   PV Peak grad:  7.6 mmHg AV Vmean:          130.000 cm/s  PV Mean grad:  3.0 mmHg AV VTI:            0.315 m AV Peak Grad:      12.7 mmHg AV Mean Grad:      8.0 mmHg LVOT Vmax:         76.40 cm/s LVOT Vmean:        55.100 cm/s LVOT VTI:          0.138 m LVOT/AV VTI ratio: 0.44  AORTA Ao Root diam: 3.20 cm MITRAL VALVE MV Area (PHT): 4.12 cm    SHUNTS MV Area VTI:   2.22 cm    Systemic VTI:  0.14 m MV Peak grad:  3.0 mmHg    Systemic Diam: 2.20 cm MV Mean grad:  2.0 mmHg MV Vmax:       0.86 m/s MV Vmean:      60.6 cm/s MV Decel Time: 184 msec MV E velocity: 75.40 cm/s MV A velocity: 82.70 cm/s MV E/A ratio:  0.91 Christopher End MD Electronically signed by Nelva Bush MD Signature Date/Time: 05/30/2021/4:05:52 PM    Final         Scheduled Meds:  aspirin  81 mg Oral Pre-Cath   [START ON 06/01/2021] aspirin EC  81 mg Oral Daily   atorvastatin  40 mg Oral Daily   heparin  5,000 Units Subcutaneous Q8H   insulin aspart  0-6 Units Subcutaneous TID WC   losartan   25 mg Oral Daily   metoprolol succinate  12.5 mg Oral Daily   nicotine  21 mg Transdermal Daily   sodium chloride flush  3 mL Intravenous Q12H   sodium chloride flush  3 mL Intravenous Q12H   Continuous Infusions:  sodium chloride     sodium chloride     sodium chloride 50 mL/hr at 05/31/21 0559     LOS: 0 days    Time spent: 61  mins     Wyvonnia Dusky, MD [facility] Pager [phone]  If 7PM-7AM, please contact night-coverage 05/31/2021, 7:33 AM

## 2021-05-31 NOTE — Care Management Obs Status (Signed)
MEDICARE OBSERVATION STATUS NOTIFICATION ? ? ?Patient Details  ?Name: Randy [name] ?MRN: [id number] ?Date of Birth: [birth year] ? ? ?Medicare Observation Status Notification Given:  Yes Reviewed with patient, wife, and daughter at bedside. Daughter signed. ? ? ? ?Candie Chroman, LCSW ?05/31/2021, 2:35 PM ?

## 2021-05-31 NOTE — Telephone Encounter (Signed)
Per Dr. Saunders Revel ok to use a DOD slot or a 48 hr. Thank you. ?

## 2021-05-31 NOTE — TOC CM/SW Note (Signed)
Went by room to give observation notice but patient is off unit. Will try again later. ? ?Dayton Scrape, [place] ?[phone] ? ?

## 2021-05-31 NOTE — TOC Initial Note (Signed)
Transition of Care (TOC) - Initial/Assessment Note  ? ? ?Patient Details  ?Name: Randy [name] ?MRN: [id number] ?Date of Birth: 1970-06-28 ? ?Transition of Care (TOC) CM/SW Contact:    ?Candie Chroman, LCSW ?Phone Number: ?05/31/2021, 2:38 PM ? ?Clinical Narrative:  CSW met with patient. Wife and daughter at bedside. Wife said he was recently discharged from his psychiatrist office for 8 no-show appointments. Wife said they tried to do telemedicine visit but found phone session was better for him and they counted those at no-shows. Provided wife and daughter with list of local psychiatry/counseling offices. No further concerns. CSW encouraged patient and his family to contact CSW as needed. CSW will continue to follow patient and his family for support and facilitate return home when stable.               ? ?Expected Discharge Plan: Home/Self Care ?Barriers to Discharge: Continued Medical Work up ? ? ?Patient Goals and CMS Choice ?  ?  ?  ? ?Expected Discharge Plan and Services ?Expected Discharge Plan: Home/Self Care ?  ?  ?Post Acute Care Choice: NA ?Living arrangements for the past 2 months: [place] ?                ?  ?  ?  ?  ?  ?  ?  ?  ?  ?  ? ?Prior Living Arrangements/Services ?Living arrangements for the past 2 months: [place] ?Lives with:: Spouse ?Patient language and need for interpreter reviewed:: Yes ?Do you feel safe going back to the place where you live?: Yes      ?Need for Family Participation in Patient Care: Yes (Comment) ?Care giver support system in place?: Yes (comment) ?  ?Criminal Activity/Legal Involvement Pertinent to Current Situation/Hospitalization: No - Comment as needed ? ?Activities of Daily Living ?Home Assistive Devices/Equipment: None ?ADL Screening (condition at time of admission) ?Patient's cognitive ability adequate to safely complete daily activities?: Yes ?Is the patient deaf or have difficulty hearing?: No ?Does the patient have difficulty seeing, even  when wearing glasses/contacts?: No ?Does the patient have difficulty concentrating, remembering, or making decisions?: No ?Patient able to express need for assistance with ADLs?: No ?Does the patient have difficulty dressing or bathing?: No ?Independently performs ADLs?: Yes (appropriate for developmental age) ?Does the patient have difficulty walking or climbing stairs?: No ?Weakness of Legs: None ?Weakness of Arms/Hands: None ? ?Permission Sought/Granted ?Permission sought to share information with : Family Supports ?Permission granted to share information with : Yes, Verbal Permission Granted ? Share Information with NAME: [name] and Rithvik Orcutt ?   ? Permission granted to share info w Relationship: Wife and daughter ? Permission granted to share info w Contact Information: [name]: [phone]: [phone] ? ?Emotional Assessment ?Appearance:: Appears stated age ?Attitude/Demeanor/Rapport: Engaged, Gracious ?Affect (typically observed): Accepting, Appropriate, Calm, Pleasant ?Orientation: : Oriented to Self, Oriented to Place, Oriented to  Time, Oriented to Situation ?Alcohol / Substance Use: Not Applicable ?Psych Involvement: Outpatient Provider ? ?Admission diagnosis:  Elevated lactic acid level [R79.89] ?Chest pain [R07.9] ?Nonspecific chest pain [R07.9] ?Patient Active Problem List  ? Diagnosis Date Noted  ? Cardiomyopathy ([place])   ? Abnormal stress test   ? Chest pain 05/30/2021  ? Closed nondisplaced fracture of head of left radius 10/14/2018  ? OSA (obstructive sleep apnea) 05/20/2018  ? Morbid obesity ([place]) 11/14/2017  ? Chronic recurrent major depressive disorder ([place]) 10/14/2017  ? Schizophrenia ([place]) 10/14/2017  ? GERD (gastroesophageal reflux  disease) 10/14/2017  ? Centrilobular emphysema ([place]) 10/14/2017  ? Type 2 diabetes mellitus with other specified complication ([place]) 63/78/5885  ? Hyperlipidemia associated with type 2 diabetes mellitus ([place]) 10/14/2017  ? ?PCP:  Olin Hauser,  DO ?Pharmacy:   ?[id number] [place] - [address] ?[address] ?[id number]?Phone: [phone]: [phone]? ?[id number]?Phone: [phone]: [phone]? ? ? ? ?Social Determinants of Health (SDOH) Interventions ?  ? ?Readmission Risk Interventions ?No flowsheet data found. ? ? ?

## 2021-05-31 NOTE — Telephone Encounter (Signed)
-----   Message from Nelva Bush, MD sent at 05/31/2021 12:29 PM EST ----- ?Regarding: Hospital follow-up ?Hello, ? ?Mr. Marano will likely be discharged home today.  Could you arrange for follow-up with me or an APP in 1-2 weeks with BMP at that time?  Let me know if any questions or concerns come up.  Thanks. ? ?Gerald Stabs ? ?

## 2021-05-31 NOTE — Plan of Care (Signed)
  Problem: Education: Goal: Knowledge of General Education information will improve Description Including pain rating scale, medication(s)/side effects and non-pharmacologic comfort measures Outcome: Progressing   Problem: Health Behavior/Discharge Planning: Goal: Ability to manage health-related needs will improve Outcome: Progressing   

## 2021-05-31 NOTE — Plan of Care (Signed)
?  Problem: Education: ?Goal: Knowledge of General Education information will improve ?Description: Including pain rating scale, medication(s)/side effects and non-pharmacologic comfort measures ?05/31/2021 1844 by Emmaline Life, RN ?Outcome: Progressing ?05/31/2021 1843 by Emmaline Life, RN ?Outcome: Progressing ?  ?Problem: Health Behavior/Discharge Planning: ?Goal: Ability to manage health-related needs will improve ?05/31/2021 1844 by Emmaline Life, RN ?Outcome: Progressing ?05/31/2021 1843 by Emmaline Life, RN ?Outcome: Progressing ?  ?

## 2021-06-01 ENCOUNTER — Encounter: Payer: Self-pay | Admitting: Internal Medicine

## 2021-06-01 ENCOUNTER — Telehealth: Payer: Self-pay

## 2021-06-01 ENCOUNTER — Ambulatory Visit: Payer: Medicare HMO | Admitting: Family Medicine

## 2021-06-01 ENCOUNTER — Telehealth: Payer: Self-pay | Admitting: Internal Medicine

## 2021-06-01 DIAGNOSIS — E785 Hyperlipidemia, unspecified: Secondary | ICD-10-CM | POA: Diagnosis not present

## 2021-06-01 DIAGNOSIS — R9439 Abnormal result of other cardiovascular function study: Secondary | ICD-10-CM | POA: Diagnosis not present

## 2021-06-01 DIAGNOSIS — I5023 Acute on chronic systolic (congestive) heart failure: Secondary | ICD-10-CM | POA: Diagnosis not present

## 2021-06-01 DIAGNOSIS — J432 Centrilobular emphysema: Secondary | ICD-10-CM | POA: Diagnosis not present

## 2021-06-01 DIAGNOSIS — I420 Dilated cardiomyopathy: Secondary | ICD-10-CM | POA: Diagnosis not present

## 2021-06-01 DIAGNOSIS — F200 Paranoid schizophrenia: Secondary | ICD-10-CM

## 2021-06-01 DIAGNOSIS — R079 Chest pain, unspecified: Secondary | ICD-10-CM | POA: Diagnosis not present

## 2021-06-01 DIAGNOSIS — R69 Illness, unspecified: Secondary | ICD-10-CM | POA: Diagnosis not present

## 2021-06-01 DIAGNOSIS — E1169 Type 2 diabetes mellitus with other specified complication: Secondary | ICD-10-CM | POA: Diagnosis not present

## 2021-06-01 DIAGNOSIS — G4733 Obstructive sleep apnea (adult) (pediatric): Secondary | ICD-10-CM | POA: Diagnosis not present

## 2021-06-01 LAB — URINALYSIS, COMPLETE (UACMP) WITH MICROSCOPIC
Bacteria, UA: NONE SEEN
Bilirubin Urine: NEGATIVE
Glucose, UA: NEGATIVE mg/dL
Hgb urine dipstick: NEGATIVE
Ketones, ur: NEGATIVE mg/dL
Leukocytes,Ua: NEGATIVE
Nitrite: NEGATIVE
Protein, ur: NEGATIVE mg/dL
Specific Gravity, Urine: 1.014 (ref 1.005–1.030)
Squamous Epithelial / HPF: NONE SEEN (ref 0–5)
WBC, UA: NONE SEEN WBC/hpf (ref 0–5)
pH: 6 (ref 5.0–8.0)

## 2021-06-01 LAB — BASIC METABOLIC PANEL
Anion gap: 11 (ref 5–15)
BUN: 22 mg/dL — ABNORMAL HIGH (ref 6–20)
CO2: 23 mmol/L (ref 22–32)
Calcium: 9.2 mg/dL (ref 8.9–10.3)
Chloride: 101 mmol/L (ref 98–111)
Creatinine, Ser: 0.91 mg/dL (ref 0.61–1.24)
GFR, Estimated: >60 mL/min (ref >60)
Glucose, Bld: 110 mg/dL — ABNORMAL HIGH (ref 70–99)
Potassium: 4.3 mmol/L (ref 3.5–5.1)
Sodium: 135 mmol/L (ref 135–145)

## 2021-06-01 LAB — CBC
HCT: 41.7 % (ref 39.0–52.0)
Hemoglobin: 13.8 g/dL (ref 13.0–17.0)
MCH: 28.9 pg (ref 26.0–34.0)
MCHC: 33.1 g/dL (ref 30.0–36.0)
MCV: 87.4 fL (ref 80.0–100.0)
Platelets: 242 10*3/uL (ref 150–400)
RBC: 4.77 MIL/uL (ref 4.22–5.81)
RDW: 13.5 % (ref 11.5–15.5)
WBC: 6.9 10*3/uL (ref 4.0–10.5)
nRBC: 0 % (ref 0.0–0.2)

## 2021-06-01 LAB — GLUCOSE, CAPILLARY
Glucose-Capillary: 116 mg/dL — ABNORMAL HIGH (ref 70–99)
Glucose-Capillary: 107 mg/dL — ABNORMAL HIGH (ref 70–99)
Glucose-Capillary: 143 mg/dL — ABNORMAL HIGH (ref 70–99)

## 2021-06-01 MED ORDER — DOXEPIN HCL 10 MG PO CAPS: 20.0000 mg | ORAL_CAPSULE | Freq: Every evening | ORAL | 0 refills | Status: DC

## 2021-06-01 MED ORDER — LOSARTAN POTASSIUM 25 MG PO TABS: 12.5000 mg | ORAL_TABLET | Freq: Every day | ORAL | 0 refills | Status: DC

## 2021-06-01 MED ORDER — NICOTINE 21 MG/24HR TD PT24: 21.0000 mg | MEDICATED_PATCH | Freq: Every day | TRANSDERMAL | 0 refills | Status: AC

## 2021-06-01 MED ORDER — ATORVASTATIN CALCIUM 40 MG PO TABS: 40.0000 mg | ORAL_TABLET | Freq: Every day | ORAL | 0 refills | Status: DC

## 2021-06-01 MED ORDER — ASPIRIN 81 MG PO TBEC: 81.0000 mg | DELAYED_RELEASE_TABLET | Freq: Every day | ORAL | 0 refills | Status: AC

## 2021-06-01 MED ORDER — LOSARTAN POTASSIUM 25 MG PO TABS
12.5000 mg | ORAL_TABLET | Freq: Every day | ORAL | Status: DC
  Administered 2021-06-01: 10:00:00 12.5 mg via ORAL
  Filled 2021-06-01: qty 1

## 2021-06-01 MED ORDER — FUROSEMIDE 20 MG PO TABS
20.0000 mg | ORAL_TABLET | Freq: Every day | ORAL | Status: DC
  Administered 2021-06-01: 10:00:00 20 mg via ORAL
  Filled 2021-06-01: qty 1

## 2021-06-01 MED ORDER — METOPROLOL SUCCINATE ER 25 MG PO TB24: 12.5000 mg | ORAL_TABLET | Freq: Every day | ORAL | 0 refills | Status: DC

## 2021-06-01 MED ORDER — FUROSEMIDE 20 MG PO TABS: 20.0000 mg | ORAL_TABLET | Freq: Every day | ORAL | 0 refills | Status: DC

## 2021-06-01 MED ORDER — DIVALPROEX SODIUM 500 MG PO DR TAB: 500.0000 mg | DELAYED_RELEASE_TABLET | Freq: Two times a day (BID) | ORAL | 0 refills | Status: DC

## 2021-06-01 MED ORDER — FLUOXETINE HCL 10 MG PO CAPS
10.0000 mg | ORAL_CAPSULE | Freq: Every day | ORAL | 0 refills | Status: AC
Start: 2021-06-01 — End: 2023-11-19

## 2021-06-01 MED ORDER — ARIPIPRAZOLE 10 MG PO TABS: 20.0000 mg | ORAL_TABLET | Freq: Every day | ORAL | 0 refills | Status: DC

## 2021-06-01 NOTE — Telephone Encounter (Signed)
LVM to schedule

## 2021-06-01 NOTE — Telephone Encounter (Signed)
LVM to call back, left info re:dr's appointment on 3/15 @ 11am w/ Dr.K ?

## 2021-06-01 NOTE — TOC Transition Note (Signed)
Transition of Care (TOC) - CM/SW Discharge Note ? ? ?Patient Details  ?Name: Randy [name] ?MRN: [id number] ?Date of Birth: Dec 23, 1970 ? ?Transition of Care (TOC) CM/SW Contact:  ?Candie Chroman, LCSW ?Phone Number: ?06/01/2021, 11:44 AM ? ? ?Clinical Narrative:  Patient has orders to discharge home today. No further concerns. CSW signing off.  ? ?Final next level of care: Home/Self Care ?Barriers to Discharge: Barriers Resolved ? ? ?Patient Goals and CMS Choice ?  ?  ?  ? ?Discharge Placement ?  ?           ?  ?  ?  ?Patient and family notified of of transfer: 06/01/21 ? ?Discharge Plan and Services ?  ?  ?Post Acute Care Choice: NA          ?  ?  ?  ?  ?  ?  ?  ?  ?  ?  ? ?Social Determinants of Health (SDOH) Interventions ?  ? ? ?Readmission Risk Interventions ?No flowsheet data found. ? ? ? ? ?

## 2021-06-01 NOTE — Progress Notes (Signed)
Discharge instructions, RX's and follow up appts explain and provided to patient and c/g verbalized understanding. Patient left floor via wheelchair accompanied by staff. No c/o pain or shortness of breath at d/c. ? ?Oluwatosin Higginson, Tivis Ringer, RN ? ?

## 2021-06-01 NOTE — Telephone Encounter (Signed)
Spoke with patient who then handed phone to his wife. Scheduled him to come in for hospital follow up. ? ?Patient contacted regarding discharge from [facility] on 06/01/21. ? ?Patient understands to follow up with provider Dr. Saunders Revel on 06/07/21 at 3:20 pm at [facility]. ?Patient understands discharge instructions? Yes ?Patient understands medications and regiment? Yes ?Patient understands to bring all medications to this visit? Yes ? ?Both patient and wife verbalized understanding of conversation, confirmed appointment, and had no further questions at this time.  ?

## 2021-06-01 NOTE — Discharge Summary (Addendum)
Physician Discharge Summary  Randy [name] AGT:[id number] DOB: January 11, 1971 DOA: 05/29/2021  PCP: Randy Hauser, DO  Admit date: 05/29/2021 Discharge date: 06/01/2021  Admitted From: home  Disposition:  home   Recommendations for Outpatient Follow-up:  Follow up with PCP in 1-2 weeks F/u w/ cardio, Randy [name], 1-2 weeks  Home Health:  Equipment/Devices:  Discharge Condition: stable  CODE STATUS: full  Diet recommendation: Heart Healthy / Carb Modified / Regular / Dysphagia   Brief/Interim Summary: HPI was taken from Randy [name]: Randy [name] is a 51 y.o. male with medical history significant of diabetes mellitus type 2, emphysema, schizophrenia, OSA.  Patient presenting to [place] with chest pain that is midsternal with following: Duration:3 days Frequency:constant  Location:middle of chest.  Quality:sharp pain.  Rate:6/10 Radiation:pain is going down from esopahgus to epigastrium . Aggravating: None/.  Alleviating:nitroglycerine.  Associated factors: SOB +, plapitation.+/.  As per Randy [name]: Randy [name] is a 51 y.o. male with a history of tobacco use, OSA, COPD, T2DM, hyperlipidemia, and schizophrenia who presented to the ED last night for progressive chest pain and shortness of breath. Chest pain was substernal,  began at rest the previous day without provocation, somewhat worse with exertion, and became associated with dyspnea, worsened to 8/10 prompting his wife to call EMS. NTG was given with some improvement. ECG revealed LBBB, inferolateral TWI (chronic), troponin 8 > 10, BNP wnl, d-dimer 0.52, subsequent CTA chest negative for PE. He was admitted this morning by Randy [name]. Echocardiogram has been performed revealing global hypokinesis with LVEF (35-40%), G1DD, normal RV, question of restricted right coronary leaflet of AV. Stress test is pending, and cardiology is consulted.   As per Randy [name] 3/8-06/01/21: Pt is s/p cardiac cath which showed non-obstructive disease  as per cardio. Pt will be d/c home w/ losartan 12.'5mg'$  daily, metoprolol succinate 12.'5mg'$  daily, lasix '20mg'$  daily w/ extra '20mg'$  for 3 lbs weight gain or shortness of breath as per cardio.  Discharge Diagnoses:  Principal Problem:   Chest pain Active Problems:   GERD (gastroesophageal reflux disease)   Centrilobular emphysema (HCC)   OSA (obstructive sleep apnea)   Schizophrenia (HCC)   Type 2 diabetes mellitus with other specified complication (HCC)   Hyperlipidemia associated with type 2 diabetes mellitus (HCC)   Cardiomyopathy (HCC)   Abnormal stress test  Chest pain: troponins neg x 2. CTA chest was neg for PE. Continue on tele. Echo ordered. S/p cardiac stress test which showed LVEF 40% w/ fixed septal defect. S/p cardiac cath which non-obstructive disease as per cardio   UTI: urine cx grew enterococcus faecalis. A script for nitrofurantion was called into his pharmacy on file and pt's wife, Randy [name], was called & this was discussed with her, pt was evidently sitting beside his wife on 06/03/21 around 2:30 pm  Acute on chronic systolic CHF: & nonischemic cardiomyopathy w/ edematous scrotum. Started on lasix as per cardio. Continue on metoprolol, losartan. Monitor I/Os.   GERD: continue on PPI   Centrilobular emphysema: continue on bronchodilators.    OSA: continue on CPAP   Schizophrenia: paranoid as per pt's wife. Continue on home dose of fluoxetine, abilify, doxepin & depakote    DM2: likely poorly controlled. Continue on SSI w/ accuchecks    HLD: continue on statin   Obesity: BMI 32.9. Would benefit from weight loss   Discharge Instructions  Discharge Instructions     Diet - low sodium heart healthy   Complete by: As directed  Diet Carb Modified   Complete by: As directed    Discharge instructions   Complete by: As directed    F/u w/ cardio, Randy [name], in 1-2 weeks. F/u w/ PCP in 1-2 weeks   Increase activity slowly   Complete by: As directed       Allergies as of  06/01/2021   No Known Allergies      Medication List     TAKE these medications    albuterol 108 (90 Base) MCG/ACT inhaler Commonly known as: ProAir HFA Inhale 1-2 puffs into the lungs every 6 (six) hours as needed for wheezing or shortness of breath.   ARIPiprazole 10 MG tablet Commonly known as: ABILIFY Take 2 tablets (20 mg total) by mouth daily.   aspirin 81 MG EC tablet Take 1 tablet (81 mg total) by mouth daily. Swallow whole. Start taking on: June 02, 2021   atorvastatin 40 MG tablet Commonly known as: LIPITOR Take 1 tablet (40 mg total) by mouth daily. Start taking on: June 02, 2021 What changed:  medication strength how much to take   cetirizine 10 MG tablet Commonly known as: ZYRTEC Take 1 tablet (10 mg total) by mouth daily.   divalproex 500 MG DR tablet Commonly known as: DEPAKOTE Take 1 tablet (500 mg total) by mouth 2 (two) times daily.   doxepin 10 MG capsule Commonly known as: SINEQUAN Take 2 capsules (20 mg total) by mouth at bedtime.   FLUoxetine 10 MG capsule Commonly known as: PROZAC Take 1 capsule (10 mg total) by mouth daily.   furosemide 20 MG tablet Commonly known as: LASIX Take 1 tablet (20 mg total) by mouth daily. With extra '20mg'$  for 3 lbs weight gain or shortness of breath Start taking on: June 02, 2021   losartan 25 MG tablet Commonly known as: COZAAR Take 0.5 tablets (12.5 mg total) by mouth daily. Start taking on: June 02, 2021   metFORMIN 500 MG 24 hr tablet Commonly known as: GLUCOPHAGE-XR Take 1 tablet (500 mg total) by mouth 2 (two) times daily with a meal.   metoprolol succinate 25 MG 24 hr tablet Commonly known as: TOPROL-XL Take 0.5 tablets (12.5 mg total) by mouth daily. Start taking on: June 02, 2021   omeprazole 20 MG tablet Commonly known as: PriLOSEC OTC Take 1 tablet (20 mg total) by mouth daily.   ondansetron 4 MG disintegrating tablet Commonly known as: Zofran ODT Allow 1-2 tablets to dissolve in  your mouth every 8 hours as needed for nausea/vomiting        No Known Allergies  Consultations: Cardio    Procedures/Studies: CT Angio Chest PE W and/or Wo Contrast  Result Date: 05/29/2021 CLINICAL DATA:  Intermittent chest pain EXAM: CT ANGIOGRAPHY CHEST WITH CONTRAST TECHNIQUE: Multidetector CT imaging of the chest was performed using the standard protocol during bolus administration of intravenous contrast. Multiplanar CT image reconstructions and MIPs were obtained to evaluate the vascular anatomy. RADIATION DOSE REDUCTION: This exam was performed according to the departmental dose-optimization program which includes automated exposure control, adjustment of the mA and/or kV according to patient size and/or use of iterative reconstruction technique. CONTRAST:  162m OMNIPAQUE IOHEXOL 350 MG/ML SOLN COMPARISON:  Chest x-ray 05/29/2021, CT chest 10/26/2013 FINDINGS: Cardiovascular: Satisfactory opacification of the pulmonary arteries to the segmental level. No evidence of pulmonary embolism. Nor nonaneurysmal aorta. Normal cardiac size. No pericardial effusion Mediastinum/Nodes: Midline trachea. No thyroid mass. No suspicious lymph nodes. Esophagus within normal limits Lungs/Pleura: Ground-glass density in  the right upper lobe as before with mild distortion likely due to fibrosis. No consolidative airspace disease, pleural effusion, or pneumothorax Upper Abdomen: No acute abnormality. Musculoskeletal: No chest wall abnormality. No acute or significant osseous findings. Chronic left-sided rib deformities. Review of the MIP images confirms the above findings. IMPRESSION: 1. Negative for acute pulmonary embolus. 2. No acute airspace disease. Chronic ground-glass density at the right apex, possible scarring. Electronically Signed   By: Donavan Foil M.D.   On: 05/29/2021 23:35   CARDIAC CATHETERIZATION  Result Date: 05/31/2021 Conclusions: Mild, nonobstructive coronary artery disease with 15-20%  stenosis involving the proximal LAD and ostial/proximal ramus intermedius. Upper normal to mildly elevated left heart filling pressures. Moderate-severely elevated right heart filling pressures. Mild pulmonary hypertension. Low normal to mildly reduced Fick cardiac output/index. Recommendations: Consider gentle diuresis in the setting of elevated filling pressures, as outlined above. Escalate goal-directed medical therapy for nonischemic cardiomyopathy. Encourage further work-up/management of sleep apnea, including compliance with CPAP. Nelva Bush, MD [facility]  NM Myocar Multi W/Spect Tamela Oddi Motion / EF  Result Date: 05/30/2021   Abnormal pharmacologic myocardial perfusion stress test.   There is a large in size, moderate in severity, fixed defect involving the entire septum as well as the apical, most likely representing LBBB artifact though scar cannot be excluded.   There is no evidence of significant ischemia.   Left ventricular systolic function is moderately reduced (LVEF 40%) with global hypokinesis.   There is no significant coronary artery calcification.   This is an intermediate risk study.   DG Chest Portable 1 View  Result Date: 05/29/2021 CLINICAL DATA:  Chest pain. EXAM: PORTABLE CHEST 1 VIEW COMPARISON:  05/01/2021 FINDINGS: Lower lung volumes from prior exam. Normal heart size for technique. Stable mediastinal contours. No focal airspace disease, pleural effusion, pneumothorax or pulmonary edema. No acute osseous findings. IMPRESSION: Low lung volumes without acute abnormality. Electronically Signed   By: Keith Rake M.D.   On: 05/29/2021 22:12   ECHOCARDIOGRAM COMPLETE  Result Date: 05/30/2021    ECHOCARDIOGRAM REPORT   Patient Name:   Randy [name] Date of Exam: 05/30/2021 Medical Rec #:  [id number]      Height:       71.0 in Accession #:    [id number]     Weight:       244.9 lb Date of Birth:  [birth year]       BSA:          2.298 m Patient Age:    68 years       BP:            130/87 mmHg Patient Gender: M              HR:           64 bpm. Exam Location:  [facility] Procedure: 2D Echo, Color Doppler, Cardiac Doppler and Intracardiac            Opacification Agent Indications:    R07.9 Chest Pain  History:        Patient has no prior history of Echocardiogram examinations.                 COPD.  Sonographer:    Charmayne Sheer Referring Phys: [place] Diagnosing      Nelva Bush MD Phys:  Sonographer Comments: Suboptimal apical window and no subcostal window. IMPRESSIONS  1. Left ventricular ejection fraction, by estimation, is 35 to 40%. The left ventricle has  moderately decreased function. The left ventricle demonstrates global hypokinesis. Left ventricular diastolic parameters are consistent with Grade I diastolic dysfunction (impaired relaxation).  2. Right ventricular systolic function is normal. The right ventricular size is normal. Tricuspid regurgitation signal is inadequate for assessing PA pressure.  3. The mitral valve is normal in structure. No evidence of mitral valve regurgitation.  4. Question restricted movement of the right coronary leaflet. The aortic valve is tricuspid. Aortic valve regurgitation is mild. No aortic stenosis is present.  5. The inferior vena cava is normal in size with greater than 50% respiratory variability, suggesting right atrial pressure of 3 mmHg. FINDINGS  Left Ventricle: Left ventricular ejection fraction, by estimation, is 35 to 40%. The left ventricle has moderately decreased function. The left ventricle demonstrates global hypokinesis. Definity contrast agent was given IV to delineate the left ventricular endocardial borders. The left ventricular internal cavity size was normal in size. There is borderline left ventricular hypertrophy. Abnormal (paradoxical) septal motion, consistent with left bundle branch block. Left ventricular diastolic parameters are consistent with Grade I diastolic dysfunction (impaired relaxation). Right Ventricle:  The right ventricular size is normal. No increase in right ventricular wall thickness. Right ventricular systolic function is normal. Tricuspid regurgitation signal is inadequate for assessing PA pressure. Left Atrium: Left atrial size was normal in size. Right Atrium: Right atrial size was normal in size. Pericardium: There is no evidence of pericardial effusion. Mitral Valve: The mitral valve is normal in structure. There is mild thickening of the mitral valve leaflet(s). No evidence of mitral valve regurgitation. MV peak gradient, 3.0 mmHg. The mean mitral valve gradient is 2.0 mmHg. Tricuspid Valve: The tricuspid valve is grossly normal. Tricuspid valve regurgitation is not demonstrated. Aortic Valve: Question restricted movement of the right coronary leaflet. The aortic valve is tricuspid. Aortic valve regurgitation is mild. No aortic stenosis is present. Aortic valve mean gradient measures 8.0 mmHg. Aortic valve peak gradient measures 12.7 mmHg. Aortic valve area, by VTI measures 1.67 cm. Pulmonic Valve: The pulmonic valve was grossly normal. Pulmonic valve regurgitation is not visualized. No evidence of pulmonic stenosis. Aorta: The aortic root and ascending aorta are structurally normal, with no evidence of dilitation. Venous: The inferior vena cava is normal in size with greater than 50% respiratory variability, suggesting right atrial pressure of 3 mmHg. IAS/Shunts: The interatrial septum was not well visualized.  LEFT VENTRICLE PLAX 2D LVIDd:         5.31 cm   Diastology LVIDs:         3.99 cm   LV e' medial:    6.31 cm/s LV PW:         1.17 cm   LV E/e' medial:  11.9 LV IVS:        0.96 cm   LV e' lateral:   6.74 cm/s LVOT diam:     2.20 cm   LV E/e' lateral: 11.2 LV SV:         52 LV SV Index:   23 LVOT Area:     3.80 cm  RIGHT VENTRICLE RV Basal diam:  3.18 cm LEFT ATRIUM             Index        RIGHT ATRIUM           Index LA diam:        4.00 cm 1.74 cm/m   RA Area:     14.30 cm LA Vol (A2C):    46.9 ml 20.41  ml/m  RA Volume:   38.20 ml  16.62 ml/m LA Vol (A4C):   33.3 ml 14.49 ml/m LA Biplane Vol: 40.5 ml 17.62 ml/m  AORTIC VALVE                     PULMONIC VALVE AV Area (Vmax):    1.63 cm      PV Vmax:       1.38 m/s AV Area (Vmean):   1.61 cm      PV Vmean:      86.300 cm/s AV Area (VTI):     1.67 cm      PV VTI:        0.242 m AV Vmax:           178.00 cm/s   PV Peak grad:  7.6 mmHg AV Vmean:          130.000 cm/s  PV Mean grad:  3.0 mmHg AV VTI:            0.315 m AV Peak Grad:      12.7 mmHg AV Mean Grad:      8.0 mmHg LVOT Vmax:         76.40 cm/s LVOT Vmean:        55.100 cm/s LVOT VTI:          0.138 m LVOT/AV VTI ratio: 0.44  AORTA Ao Root diam: 3.20 cm MITRAL VALVE MV Area (PHT): 4.12 cm    SHUNTS MV Area VTI:   2.22 cm    Systemic VTI:  0.14 m MV Peak grad:  3.0 mmHg    Systemic Diam: 2.20 cm MV Mean grad:  2.0 mmHg MV Vmax:       0.86 m/s MV Vmean:      60.6 cm/s MV Decel Time: 184 msec MV E velocity: 75.40 cm/s MV A velocity: 82.70 cm/s MV E/A ratio:  0.91 Christopher End MD Electronically signed by Nelva Bush MD Signature Date/Time: 05/30/2021/4:05:52 PM    Final    (Echo, Carotid, EGD, Colonoscopy, ERCP)    Subjective: Pt denies any complaints    Discharge Exam: Vitals:   06/01/21 0430 06/01/21 0806  BP: 104/64 111/72  Pulse: 60 (!) 56  Resp: 20 18  Temp: (!) 97.5 F (36.4 C) 97.8 F (36.6 C)  SpO2: 96% 96%   Vitals:   05/31/21 1927 05/31/21 2340 06/01/21 0430 06/01/21 0806  BP: 106/66 (!) 135/98 104/64 111/72  Pulse: 75 70 60 (!) 56  Resp: '20 20 20 18  '$ Temp: 98.3 F (36.8 C) 97.8 F (36.6 C) (!) 97.5 F (36.4 C) 97.8 F (36.6 C)  TempSrc: Oral Oral Oral   SpO2: 97% 99% 96% 96%  Weight:   107 kg   Height:        General: Pt is alert, awake, not in acute distress Cardiovascular: S1/S2 +, no rubs, no gallops Respiratory: decreased breath sounds b/l  Abdominal: Soft, NT, obese, bowel sounds + Extremities:no cyanosis    The results of  significant diagnostics from this hospitalization (including imaging, microbiology, ancillary and laboratory) are listed below for reference.     Microbiology: Recent Results (from the past 240 hour(s))  Resp Panel by RT-PCR (Flu A&B, Covid) Nasopharyngeal Swab     Status: None   Collection Time: 05/29/21 10:21 PM   Specimen: Nasopharyngeal Swab; Nasopharyngeal(NP) swabs in vial transport medium  Result Value Ref Range Status   SARS Coronavirus 2 by RT PCR NEGATIVE NEGATIVE Final  Comment: (NOTE) SARS-CoV-2 target nucleic acids are NOT DETECTED.  The SARS-CoV-2 RNA is generally detectable in upper respiratory specimens during the acute phase of infection. The lowest concentration of SARS-CoV-2 viral copies this assay can detect is 138 copies/mL. A negative result does not preclude SARS-Cov-2 infection and should not be used as the sole basis for treatment or other patient management decisions. A negative result may occur with  improper specimen collection/handling, submission of specimen other than nasopharyngeal swab, presence of viral mutation(s) within the areas targeted by this assay, and inadequate number of viral copies(<138 copies/mL). A negative result must be combined with clinical observations, patient history, and epidemiological information. The expected result is Negative.  Fact Sheet for Patients:  EntrepreneurPulse.com.au  Fact Sheet for Healthcare Providers:  IncredibleEmployment.be  This test is no t yet approved or cleared by the [place] FDA and  has been authorized for detection and/or diagnosis of SARS-CoV-2 by FDA under an Emergency Use Authorization (EUA). This EUA will remain  in effect (meaning this test can be used) for the duration of the COVID-19 declaration under Section 564(b)(1) of the Act, 21 U.S.C.section 360bbb-3(b)(1), unless the authorization is terminated  or revoked sooner.       Influenza A by  PCR NEGATIVE NEGATIVE Final   Influenza B by PCR NEGATIVE NEGATIVE Final    Comment: (NOTE) The Xpert Xpress SARS-CoV-2/FLU/RSV plus assay is intended as an aid in the diagnosis of influenza from Nasopharyngeal swab specimens and should not be used as a sole basis for treatment. Nasal washings and aspirates are unacceptable for Xpert Xpress SARS-CoV-2/FLU/RSV testing.  Fact Sheet for Patients: EntrepreneurPulse.com.au  Fact Sheet for Healthcare Providers: IncredibleEmployment.be  This test is not yet approved or cleared by the [place] FDA and has been authorized for detection and/or diagnosis of SARS-CoV-2 by FDA under an Emergency Use Authorization (EUA). This EUA will remain in effect (meaning this test can be used) for the duration of the COVID-19 declaration under Section 564(b)(1) of the Act, 21 U.S.C. section 360bbb-3(b)(1), unless the authorization is terminated or revoked.  Performed at [facility] [address] [place], [place] [address]      Labs: BNP (last 3 results) Recent Labs    05/29/21 2214  BNP 97.0   Basic Metabolic Panel: Recent Labs  Lab 05/29/21 2214 05/30/21 0203 05/31/21 0550 06/01/21 0753  NA 139 139 138 135  K 3.9 4.1 4.0 4.3  CL 101 106 101 101  CO2 '26 27 26 23  '$ GLUCOSE 181* 100* 105* 110*  BUN '15 15 20 '$ 22*  CREATININE 0.98 0.90 0.85 0.91  CALCIUM 9.5 8.9 9.7 9.2   Liver Function Tests: Recent Labs  Lab 05/29/21 2214  AST 21  ALT 23  ALKPHOS 56  BILITOT 0.2*  PROT 7.9  ALBUMIN 4.0   Recent Labs  Lab 05/29/21 2214  LIPASE 51   No results for input(s): AMMONIA in the last 168 hours. CBC: Recent Labs  Lab 05/29/21 2214 05/30/21 0203 05/31/21 0550 06/01/21 0753  WBC 5.8 5.3 6.4 6.9  HGB 13.6 12.1* 13.1 13.8  HCT 41.2 36.8* 39.6 41.7  MCV 88.4 89.3 87.0 87.4  PLT 233 205 243 242   Cardiac Enzymes: No results for input(s): CKTOTAL, CKMB, CKMBINDEX, TROPONINI in  the last 168 hours. BNP: Invalid input(s): POCBNP CBG: Recent Labs  Lab 05/31/21 1607 05/31/21 1927 05/31/21 2340 06/01/21 0430 06/01/21 0807  GLUCAP 122* 127* 129* 116* 107*   D-Dimer Recent Labs  05/29/21 2214  DDIMER 0.52*   Hgb A1c Recent Labs    05/30/21 0203  HGBA1C 6.2*   Lipid Profile Recent Labs    05/30/21 0203  CHOL 135  HDL 45  LDLCALC 49  TRIG 203*  CHOLHDL 3.0   Thyroid function studies Recent Labs    05/30/21 0203  TSH 2.440   Anemia work up No results for input(s): VITAMINB12, FOLATE, FERRITIN, TIBC, IRON, RETICCTPCT in the last 72 hours. Urinalysis    Component Value Date/Time   COLORURINE STRAW (A) 06/01/2021 0814   APPEARANCEUR CLEAR (A) 06/01/2021 0814   APPEARANCEUR Turbid 11/30/2013 1548   LABSPEC 1.014 06/01/2021 0814   LABSPEC 1.017 11/30/2013 1548   PHURINE 6.0 06/01/2021 0814   GLUCOSEU NEGATIVE 06/01/2021 0814   GLUCOSEU Negative 11/30/2013 1548   HGBUR NEGATIVE 06/01/2021 0814   BILIRUBINUR NEGATIVE 06/01/2021 0814   BILIRUBINUR Negative 11/30/2013 [place] 06/01/2021 0814   PROTEINUR NEGATIVE 06/01/2021 0814   NITRITE NEGATIVE 06/01/2021 0814   LEUKOCYTESUR NEGATIVE 06/01/2021 0814   LEUKOCYTESUR Negative 11/30/2013 1548   Sepsis Labs Invalid input(s): PROCALCITONIN,  WBC,  LACTICIDVEN Microbiology Recent Results (from the past 240 hour(s))  Resp Panel by RT-PCR (Flu A&B, Covid) Nasopharyngeal Swab     Status: None   Collection Time: 05/29/21 10:21 PM   Specimen: Nasopharyngeal Swab; Nasopharyngeal(NP) swabs in vial transport medium  Result Value Ref Range Status   SARS Coronavirus 2 by RT PCR NEGATIVE NEGATIVE Final    Comment: (NOTE) SARS-CoV-2 target nucleic acids are NOT DETECTED.  The SARS-CoV-2 RNA is generally detectable in upper respiratory specimens during the acute phase of infection. The lowest concentration of SARS-CoV-2 viral copies this assay can detect is 138 copies/mL. A  negative result does not preclude SARS-Cov-2 infection and should not be used as the sole basis for treatment or other patient management decisions. A negative result may occur with  improper specimen collection/handling, submission of specimen other than nasopharyngeal swab, presence of viral mutation(s) within the areas targeted by this assay, and inadequate number of viral copies(<138 copies/mL). A negative result must be combined with clinical observations, patient history, and epidemiological information. The expected result is Negative.  Fact Sheet for Patients:  EntrepreneurPulse.com.au  Fact Sheet for Healthcare Providers:  IncredibleEmployment.be  This test is no t yet approved or cleared by the [place] FDA and  has been authorized for detection and/or diagnosis of SARS-CoV-2 by FDA under an Emergency Use Authorization (EUA). This EUA will remain  in effect (meaning this test can be used) for the duration of the COVID-19 declaration under Section 564(b)(1) of the Act, 21 U.S.C.section 360bbb-3(b)(1), unless the authorization is terminated  or revoked sooner.       Influenza A by PCR NEGATIVE NEGATIVE Final   Influenza B by PCR NEGATIVE NEGATIVE Final    Comment: (NOTE) The Xpert Xpress SARS-CoV-2/FLU/RSV plus assay is intended as an aid in the diagnosis of influenza from Nasopharyngeal swab specimens and should not be used as a sole basis for treatment. Nasal washings and aspirates are unacceptable for Xpert Xpress SARS-CoV-2/FLU/RSV testing.  Fact Sheet for Patients: EntrepreneurPulse.com.au  Fact Sheet for Healthcare Providers: IncredibleEmployment.be  This test is not yet approved or cleared by the [place] FDA and has been authorized for detection and/or diagnosis of SARS-CoV-2 by FDA under an Emergency Use Authorization (EUA). This EUA will remain in effect (meaning this test can  be used) for the duration of the COVID-19 declaration under Section  564(b)(1) of the Act, 21 U.S.C. section 360bbb-3(b)(1), unless the authorization is terminated or revoked.  Performed at [facility], [address], [place], [place] [address]      Time coordinating discharge: Over 30 minutes  SIGNED:   Wyvonnia Dusky, MD  [facility] 06/01/2021, 11:28 AM Pager   If 7PM-7AM, please contact night-coverage

## 2021-06-01 NOTE — Telephone Encounter (Signed)
-----   Message from Nelva Bush, MD sent at 05/31/2021 12:29 PM EST ----- ?Regarding: Hospital follow-up ?Hello, ? ?Mr. Dripps will likely be discharged home today.  Could you arrange for follow-up with me or an APP in 1-2 weeks with BMP at that time?  Let me know if any questions or concerns come up.  Thanks. ? ?Gerald Stabs ? ?

## 2021-06-01 NOTE — Progress Notes (Signed)
Progress Note  Patient Name: Randy [name] Date of Encounter: 06/01/2021  [facility] Cardiologist: [facility]  Subjective   Reports doing well this morning, No chest pain, catheterization site healing well. Denies significant shortness of breath Blood pressure running low  Inpatient Medications    Scheduled Meds:  ARIPiprazole  20 mg Oral Daily   aspirin EC  81 mg Oral Daily   atorvastatin  40 mg Oral Daily   doxepin  20 mg Oral QHS   enoxaparin (LOVENOX) injection  0.5 mg/kg Subcutaneous Q24H   FLUoxetine  10 mg Oral Daily   furosemide  40 mg Oral Daily   insulin aspart  0-6 Units Subcutaneous Q4H   losartan  12.5 mg Oral Daily   metoprolol succinate  12.5 mg Oral Daily   nicotine  21 mg Transdermal Daily   sodium chloride flush  3 mL Intravenous Q12H   sodium chloride flush  3 mL Intravenous Q12H   Continuous Infusions:  sodium chloride     sodium chloride     PRN Meds: sodium chloride, sodium chloride, acetaminophen, nitroGLYCERIN, ondansetron (ZOFRAN) IV, sodium chloride flush, sodium chloride flush   Vital Signs    Vitals:   05/31/21 1927 05/31/21 2340 06/01/21 0430 06/01/21 0806  BP: 106/66 (!) 135/98 104/64 111/72  Pulse: 75 70 60 (!) 56  Resp: '20 20 20 18  '$ Temp: 98.3 F (36.8 C) 97.8 F (36.6 C) (!) 97.5 F (36.4 C) 97.8 F (36.6 C)  TempSrc: Oral Oral Oral   SpO2: 97% 99% 96% 96%  Weight:   107 kg   Height:        Intake/Output Summary (Last 24 hours) at 06/01/2021 0819 Last data filed at 06/01/2021 0809 Gross per 24 hour  Intake 1086 ml  Output 650 ml  Net 436 ml   Last 3 Weights 06/01/2021 05/31/2021 05/31/2021  Weight (lbs) 235 lb 14.3 oz 236 lb 1.6 oz 236 lb 1.6 oz  Weight (kg) 107 kg 107.094 kg 107.094 kg      Telemetry    Normal sinus rhythm- Personally Reviewed  ECG     - Personally Reviewed  Physical Exam   GEN: No acute distress.   Neck: No JVD Cardiac: RRR, no murmurs, rubs, or gallops.  Respiratory: Clear to auscultation  bilaterally. GI: Soft, nontender, non-distended  MS: No edema; No deformity. Neuro:  Nonfocal  Psych: Normal affect   Labs    High Sensitivity Troponin:   Recent Labs  Lab 05/29/21 2214 05/30/21 0203  TROPONINIHS 8 10     Chemistry Recent Labs  Lab 05/29/21 2214 05/30/21 0203 05/31/21 0550  NA 139 139 138  K 3.9 4.1 4.0  CL 101 106 101  CO2 '26 27 26  '$ GLUCOSE 181* 100* 105*  BUN '15 15 20  '$ CREATININE 0.98 0.90 0.85  CALCIUM 9.5 8.9 9.7  PROT 7.9  --   --   ALBUMIN 4.0  --   --   AST 21  --   --   ALT 23  --   --   ALKPHOS 56  --   --   BILITOT 0.2*  --   --   GFRNONAA >60 >60 >60  ANIONGAP '12 6 11    '$ Lipids  Recent Labs  Lab 05/30/21 0203  CHOL 135  TRIG 203*  HDL 45  LDLCALC 49  CHOLHDL 3.0    Hematology Recent Labs  Lab 05/29/21 2214 05/30/21 0203 05/31/21 0550  WBC 5.8 5.3 6.4  RBC 4.66 4.12* 4.55  HGB 13.6 12.1* 13.1  HCT 41.2 36.8* 39.6  MCV 88.4 89.3 87.0  MCH 29.2 29.4 28.8  MCHC 33.0 32.9 33.1  RDW 13.6 13.5 13.5  PLT 233 205 243   Thyroid  Recent Labs  Lab 05/30/21 0203  TSH 2.440  FREET4 0.80    BNP Recent Labs  Lab 05/29/21 2214  BNP 20.5    DDimer  Recent Labs  Lab 05/29/21 2214  DDIMER 0.52*     Radiology    CARDIAC CATHETERIZATION  Result Date: 05/31/2021 Conclusions: Mild, nonobstructive coronary artery disease with 15-20% stenosis involving the proximal LAD and ostial/proximal ramus intermedius. Upper normal to mildly elevated left heart filling pressures. Moderate-severely elevated right heart filling pressures. Mild pulmonary hypertension. Low normal to mildly reduced Fick cardiac output/index. Recommendations: Consider gentle diuresis in the setting of elevated filling pressures, as outlined above. Escalate goal-directed medical therapy for nonischemic cardiomyopathy. Encourage further work-up/management of sleep apnea, including compliance with CPAP. Nelva Bush, MD [facility]  NM Myocar Multi W/Spect  Tamela Oddi Motion / EF  Result Date: 05/30/2021   Abnormal pharmacologic myocardial perfusion stress test.   There is a large in size, moderate in severity, fixed defect involving the entire septum as well as the apical, most likely representing LBBB artifact though scar cannot be excluded.   There is no evidence of significant ischemia.   Left ventricular systolic function is moderately reduced (LVEF 40%) with global hypokinesis.   There is no significant coronary artery calcification.   This is an intermediate risk study.   ECHOCARDIOGRAM COMPLETE  Result Date: 05/30/2021    ECHOCARDIOGRAM REPORT   Patient Name:   Randy [name] Date of Exam: 05/30/2021 Medical Rec #:  [id number]      Height:       71.0 in Accession #:    [id number]     Weight:       244.9 lb Date of Birth:  04-14-1970       BSA:          2.298 m Patient Age:    51 years       BP:           130/87 mmHg Patient Gender: M              HR:           64 bpm. Exam Location:  [facility] Procedure: 2D Echo, Color Doppler, Cardiac Doppler and Intracardiac            Opacification Agent Indications:    R07.9 Chest Pain  History:        Patient has no prior history of Echocardiogram examinations.                 COPD.  Sonographer:    Charmayne Sheer Referring Phys: [place] Diagnosing      Nelva Bush MD Phys:  Sonographer Comments: Suboptimal apical window and no subcostal window. IMPRESSIONS  1. Left ventricular ejection fraction, by estimation, is 35 to 40%. The left ventricle has moderately decreased function. The left ventricle demonstrates global hypokinesis. Left ventricular diastolic parameters are consistent with Grade I diastolic dysfunction (impaired relaxation).  2. Right ventricular systolic function is normal. The right ventricular size is normal. Tricuspid regurgitation signal is inadequate for assessing PA pressure.  3. The mitral valve is normal in structure. No evidence of mitral valve regurgitation.  4. Question restricted movement of  the right coronary leaflet. The  aortic valve is tricuspid. Aortic valve regurgitation is mild. No aortic stenosis is present.  5. The inferior vena cava is normal in size with greater than 50% respiratory variability, suggesting right atrial pressure of 3 mmHg. FINDINGS  Left Ventricle: Left ventricular ejection fraction, by estimation, is 35 to 40%. The left ventricle has moderately decreased function. The left ventricle demonstrates global hypokinesis. Definity contrast agent was given IV to delineate the left ventricular endocardial borders. The left ventricular internal cavity size was normal in size. There is borderline left ventricular hypertrophy. Abnormal (paradoxical) septal motion, consistent with left bundle branch block. Left ventricular diastolic parameters are consistent with Grade I diastolic dysfunction (impaired relaxation). Right Ventricle: The right ventricular size is normal. No increase in right ventricular wall thickness. Right ventricular systolic function is normal. Tricuspid regurgitation signal is inadequate for assessing PA pressure. Left Atrium: Left atrial size was normal in size. Right Atrium: Right atrial size was normal in size. Pericardium: There is no evidence of pericardial effusion. Mitral Valve: The mitral valve is normal in structure. There is mild thickening of the mitral valve leaflet(s). No evidence of mitral valve regurgitation. MV peak gradient, 3.0 mmHg. The mean mitral valve gradient is 2.0 mmHg. Tricuspid Valve: The tricuspid valve is grossly normal. Tricuspid valve regurgitation is not demonstrated. Aortic Valve: Question restricted movement of the right coronary leaflet. The aortic valve is tricuspid. Aortic valve regurgitation is mild. No aortic stenosis is present. Aortic valve mean gradient measures 8.0 mmHg. Aortic valve peak gradient measures 12.7 mmHg. Aortic valve area, by VTI measures 1.67 cm. Pulmonic Valve: The pulmonic valve was grossly normal. Pulmonic  valve regurgitation is not visualized. No evidence of pulmonic stenosis. Aorta: The aortic root and ascending aorta are structurally normal, with no evidence of dilitation. Venous: The inferior vena cava is normal in size with greater than 50% respiratory variability, suggesting right atrial pressure of 3 mmHg. IAS/Shunts: The interatrial septum was not well visualized.  LEFT VENTRICLE PLAX 2D LVIDd:         5.31 cm   Diastology LVIDs:         3.99 cm   LV e' medial:    6.31 cm/s LV PW:         1.17 cm   LV E/e' medial:  11.9 LV IVS:        0.96 cm   LV e' lateral:   6.74 cm/s LVOT diam:     2.20 cm   LV E/e' lateral: 11.2 LV SV:         52 LV SV Index:   23 LVOT Area:     3.80 cm  RIGHT VENTRICLE RV Basal diam:  3.18 cm LEFT ATRIUM             Index        RIGHT ATRIUM           Index LA diam:        4.00 cm 1.74 cm/m   RA Area:     14.30 cm LA Vol (A2C):   46.9 ml 20.41 ml/m  RA Volume:   38.20 ml  16.62 ml/m LA Vol (A4C):   33.3 ml 14.49 ml/m LA Biplane Vol: 40.5 ml 17.62 ml/m  AORTIC VALVE                     PULMONIC VALVE AV Area (Vmax):    1.63 cm      PV Vmax:  1.38 m/s AV Area (Vmean):   1.61 cm      PV Vmean:      86.300 cm/s AV Area (VTI):     1.67 cm      PV VTI:        0.242 m AV Vmax:           178.00 cm/s   PV Peak grad:  7.6 mmHg AV Vmean:          130.000 cm/s  PV Mean grad:  3.0 mmHg AV VTI:            0.315 m AV Peak Grad:      12.7 mmHg AV Mean Grad:      8.0 mmHg LVOT Vmax:         76.40 cm/s LVOT Vmean:        55.100 cm/s LVOT VTI:          0.138 m LVOT/AV VTI ratio: 0.44  AORTA Ao Root diam: 3.20 cm MITRAL VALVE MV Area (PHT): 4.12 cm    SHUNTS MV Area VTI:   2.22 cm    Systemic VTI:  0.14 m MV Peak grad:  3.0 mmHg    Systemic Diam: 2.20 cm MV Mean grad:  2.0 mmHg MV Vmax:       0.86 m/s MV Vmean:      60.6 cm/s MV Decel Time: 184 msec MV E velocity: 75.40 cm/s MV A velocity: 82.70 cm/s MV E/A ratio:  0.91 Harrell Gave End MD Electronically signed by Nelva Bush MD  Signature Date/Time: 05/30/2021/4:05:52 PM    Final     Cardiac Studies    Echocardiogram  1. Left ventricular ejection fraction, by estimation, is 35 to 40%. The  left ventricle has moderately decreased function. The left ventricle  demonstrates global hypokinesis. Left ventricular diastolic parameters are  consistent with Grade I diastolic  dysfunction (impaired relaxation).   2. Right ventricular systolic function is normal. The right ventricular  size is normal. Tricuspid regurgitation signal is inadequate for assessing  PA pressure.   3. The mitral valve is normal in structure. No evidence of mitral valve  regurgitation.   4. Question restricted movement of the right coronary leaflet. The aortic  valve is tricuspid. Aortic valve regurgitation is mild. No aortic stenosis  is present.   5. The inferior vena cava is normal in size with greater than 50%  respiratory variability, suggesting right atrial pressure of 3 mmHg.    Patient Profile     51 y.o. male with history of hyperlipidemia, type 2 diabetes mellitus, COPD, schizophrenia, and obstructive sleep apnea, admitted with atypical chest pain and found to have new cardiomyopathy and abnormal stress test in the setting of chronic LBBB.   Assessment & Plan    Chest pain Atypical in nature, cardiac catheterization by Dr. Saunders Revel with nonobstructive disease False positive stress test secondary to left bundle branch block Low blood pressures, will decrease losartan down to 12.5 daily, metoprolol down to 12.5 daily given bradycardia Lasix to 20 daily  Nonischemic cardiomyopathy Started on metoprolol losartan Lasix 40 this admission Blood pressure low, bradycardic Left ventricular end-diastolic pressure mildly elevated Was not on Lasix as outpatient --We will recommend we decrease dosing: Losartan 12.5 daily, metoprolol succinate 12.5 daily, Lasix 20 daily with extra 20 mg for 3 pound weight gain or shortness of breath    Total  encounter time more than 40 minutes  Greater than 50% was spent in counseling and coordination of care  with the patient  [facility] will sign off.   Medication Recommendations: Medication changes as above Other recommendations (labs, testing, etc): No further testing Follow up as an outpatient: Follow-up with Dr. Saunders Revel, primary cardiologist  For questions or updates, please contact [place] Please consult [email or web address] for contact info under        Signed, Ida Rogue, MD  06/01/2021, 8:19 AM

## 2021-06-01 NOTE — Plan of Care (Signed)
?  Problem: Education: ?Goal: Knowledge of General Education information will improve ?Description: Including pain rating scale, medication(s)/side effects and non-pharmacologic comfort measures ?Outcome: Adequate for Discharge ?  ?Problem: Health Behavior/Discharge Planning: ?Goal: Ability to manage health-related needs will improve ?Outcome: Adequate for Discharge ?  ?Problem: Clinical Measurements: ?Goal: Ability to maintain clinical measurements within normal limits will improve ?Outcome: Adequate for Discharge ?Goal: Will remain free from infection ?Outcome: Adequate for Discharge ?Goal: Diagnostic test results will improve ?Outcome: Adequate for Discharge ?Goal: Respiratory complications will improve ?Outcome: Adequate for Discharge ?Goal: Cardiovascular complication will be avoided ?Outcome: Adequate for Discharge ?  ?Problem: Cardiac: ?Goal: Ability to achieve and maintain adequate cardiopulmonary perfusion will improve ?Outcome: Adequate for Discharge ?  ?

## 2021-06-02 MED ORDER — FLUOXETINE 10 MG CAPSULE
ORAL_CAPSULE | 0 refills | 0 days
Start: 2021-06-02 — End: ?

## 2021-06-03 LAB — URINE CULTURE: Culture: 60000 — AB

## 2021-06-07 ENCOUNTER — Other Ambulatory Visit: Payer: Self-pay

## 2021-06-07 ENCOUNTER — Ambulatory Visit: Payer: Medicare HMO | Admitting: Internal Medicine

## 2021-06-07 ENCOUNTER — Inpatient Hospital Stay: Payer: Medicare HMO | Admitting: Family Medicine

## 2021-06-07 ENCOUNTER — Other Ambulatory Visit
Admission: RE | Admit: 2021-06-07 | Discharge: 2021-06-07 | Disposition: A | Discharge status: Home / Self Care | Payer: Medicare HMO | Attending: Internal Medicine | Admitting: Internal Medicine

## 2021-06-07 ENCOUNTER — Encounter: Payer: Self-pay | Admitting: Internal Medicine

## 2021-06-07 VITALS — BP 116/74 | HR 91 | Ht 73.0 in | Wt 243.0 lb

## 2021-06-07 DIAGNOSIS — R1011 Right upper quadrant pain: Secondary | ICD-10-CM

## 2021-06-07 DIAGNOSIS — I5021 Acute systolic (congestive) heart failure: Secondary | ICD-10-CM

## 2021-06-07 DIAGNOSIS — I2510 Atherosclerotic heart disease of native coronary artery without angina pectoris: Secondary | ICD-10-CM | POA: Diagnosis not present

## 2021-06-07 DIAGNOSIS — E1169 Type 2 diabetes mellitus with other specified complication: Secondary | ICD-10-CM

## 2021-06-07 DIAGNOSIS — R0789 Other chest pain: Secondary | ICD-10-CM | POA: Diagnosis not present

## 2021-06-07 DIAGNOSIS — I420 Dilated cardiomyopathy: Secondary | ICD-10-CM

## 2021-06-07 DIAGNOSIS — I428 Other cardiomyopathies: Secondary | ICD-10-CM

## 2021-06-07 LAB — COMPREHENSIVE METABOLIC PANEL
ALT: 23 U/L (ref 0–44)
AST: 32 U/L (ref 15–41)
Albumin: 4.2 g/dL (ref 3.5–5.0)
Alkaline Phosphatase: 54 U/L (ref 38–126)
Anion gap: 11 (ref 5–15)
BUN: 15 mg/dL (ref 6–20)
CO2: 26 mmol/L (ref 22–32)
Calcium: 9.6 mg/dL (ref 8.9–10.3)
Chloride: 99 mmol/L (ref 98–111)
Creatinine, Ser: 1.07 mg/dL (ref 0.61–1.24)
GFR, Estimated: >60 mL/min (ref >60)
Glucose, Bld: 126 mg/dL — ABNORMAL HIGH (ref 70–99)
Potassium: 3.6 mmol/L (ref 3.5–5.1)
Sodium: 136 mmol/L (ref 135–145)
Total Bilirubin: 0.5 mg/dL (ref 0.3–1.2)
Total Protein: 8 g/dL (ref 6.5–8.1)

## 2021-06-07 MED ORDER — FUROSEMIDE 40 MG PO TABS: 40.0000 mg | ORAL_TABLET | Freq: Every day | ORAL | 0 refills | Status: DC

## 2021-06-07 NOTE — Progress Notes (Signed)
? ?Follow-up Outpatient Visit ?Date: 06/07/2021 ? ?Primary Care Provider: ?Olin Hauser, DO ?[address] ?[place] [address] ? ?Chief Complaint: Follow-up chest pain and newly diagnosed cardiomyopathy ? ?HPI:  Randy [name] is a 51 y.o. male with history of recently diagnosed HFrEF due to nonischemic cardiomyopathy, chronic LBBB, hyperlipidemia, type 2 diabetes mellitus, COPD, schizophrenia, and obstructive sleep apnea not on CPAP, who presents for follow-up of recent hospitalization with chest pain, found to have newly reduced LVEF.  Mr. Haub presented to the [facility] emergency department on 05/29/2021 complaining of a 2-day history of chest pain.  He underwent stress testing and echocardiography, which showed moderately reduced LVEF and a fixed septal defect on perfusion imaging.  He subsequently underwent cardiac catheterization that showed mild, nonobstructive CAD, upper normal to mildly elevated left heart filling pressures, and moderately-severely elevated right heart filling pressures.  Was discharged on low-dose furosemide as well as goal-directed medical therapy with metoprolol succinate 12.5 mg daily and losartan 12.5 mg daily. ? ?Today, Mr. Narine reports that he feels about the same as when he left he hospital.  He continues to have frequent chest pain that comes and goes throughout the day.  It is not related to exertion, specific movements/positions, or eating.  He also has constant shortness of breath without orthopnea.  He denies leg edema.  He has felt lightheaded at times but denies syncope and palpitations.  He has tried taking an antacid without relief of his chest pain.  He denies medication side effects. ? ?-------------------------------------------------------------------------------------------------- ? ?Past Medical History:  ?Diagnosis Date  ? Allergy   ? Bipolar 1 disorder ([place])   ? COPD (chronic obstructive pulmonary disease) ([place])   ? GERD (gastroesophageal reflux disease)   ? LBBB (left  bundle branch block)   ? Nonischemic cardiomyopathy ([place]) 05/30/2021  ? LVEF 35-40%; mild, non-obstructive CAD by LHC.  ? ?Past Surgical History:  ?Procedure Laterality Date  ? RIGHT/LEFT HEART CATH AND CORONARY ANGIOGRAPHY N/A 05/31/2021  ? Procedure: RIGHT/LEFT HEART CATH AND CORONARY ANGIOGRAPHY;  Surgeon: Nelva Bush, MD;  Location: [facility];  Service: Cardiovascular;  Laterality: N/A;  ? ? ? ?Recent CV Pertinent Labs: ?Lab Results  ?Component Value Date  ? CHOL 135 05/30/2021  ? HDL 45 05/30/2021  ? LDLCALC 49 05/30/2021  ? [place] 52 11/08/2020  ? TRIG 203 (H) 05/30/2021  ? CHOLHDL 3.0 05/30/2021  ? INR 1.1 01/02/2012  ? BNP 20.5 05/29/2021  ? K 4.3 06/01/2021  ? K 3.6 04/15/2014  ? BUN 22 (H) 06/01/2021  ? BUN 9 04/15/2014  ? CREATININE 0.91 06/01/2021  ? CREATININE 0.94 11/08/2020  ? ? ?Past medical and surgical history were reviewed and updated in EPIC. ? ?Current Meds  ?Medication Sig  ? albuterol (PROAIR HFA) 108 (90 Base) MCG/ACT inhaler Inhale 1-2 puffs into the lungs every 6 (six) hours as needed for wheezing or shortness of breath.  ? ARIPiprazole (ABILIFY) 10 MG tablet Take 2 tablets (20 mg total) by mouth daily.  ? aspirin EC 81 MG EC tablet Take 1 tablet (81 mg total) by mouth daily. Swallow whole.  ? atorvastatin (LIPITOR) 40 MG tablet Take 1 tablet (40 mg total) by mouth daily.  ? cetirizine (ZYRTEC) 10 MG tablet Take 10 mg by mouth daily as needed for allergies.  ? divalproex (DEPAKOTE) 500 MG DR tablet Take 2 tablets nightly  ? doxepin (SINEQUAN) 10 MG capsule Take 2 capsules (20 mg total) by mouth at bedtime.  ? FLUoxetine (  PROZAC) 10 MG capsule Take 1 capsule (10 mg total) by mouth daily.  ? FLUoxetine (PROZAC) 40 MG capsule Take 40 mg by mouth daily.  ? furosemide (LASIX) 20 MG tablet Take 1 tablet (20 mg total) by mouth daily. With extra '20mg'$  for 3 lbs weight gain or shortness of breath  ? losartan (COZAAR) 25 MG tablet Take 0.5 tablets (12.5 mg total) by mouth daily.  ?  metFORMIN (GLUCOPHAGE-XR) 500 MG 24 hr tablet Take 1 tablet (500 mg total) by mouth 2 (two) times daily with a meal.  ? metoprolol succinate (TOPROL-XL) 25 MG 24 hr tablet Take 0.5 tablets (12.5 mg total) by mouth daily.  ? nicotine (NICODERM CQ - DOSED IN MG/24 HOURS) 21 mg/24hr patch Place 1 patch (21 mg total) onto the skin daily.  ? ? ?Allergies: Patient has no known allergies. ? ?Social History  ? ?Tobacco Use  ? Smoking status: Former  ?  Packs/day: 0.00  ?  Years: 0.00  ?  Pack years: 0.00  ?  Types: Cigarettes  ? Smokeless tobacco: Current  ?  Types: Chew  ?Vaping Use  ? Vaping Use: Never used  ?Substance Use Topics  ? Alcohol use: No  ? Drug use: Never  ? ? ?Family History  ?Problem Relation Age of Onset  ? Alcohol abuse Mother   ? Cancer Father   ?     colon   ? Colon cancer Father   ? ? ?Review of Systems: ?A 12-system review of systems was performed and was negative except as noted in the HPI. ? ?-------------------------------------------------------------------------------------------------- ? ?Physical Exam: ?BP 116/74 (BP Location: Left Arm, Patient Position: Sitting, Cuff Size: Large)   Pulse 91   Ht '6\' 1"'$  (1.854 m)   Wt 243 lb (110.2 kg)   SpO2 97%   BMI 32.06 kg/m?  ? ?General:  NAD. ?Neck: JVP ~8 cm. ?Lungs: Clear to auscultation bilaterally without wheezes or crackles. ?Heart: Regular rate and rhythm without murmurs, rubs, or gallops. ?Abdomen: Soft with RUQ tenderness that reproduces chest pain. ?Extremities: Trace pretibial edema bilaterally.  Right radial and brachial catheterization sites well-healed without hematoma.  Right radial pulse is 2+. ? ?EKG:  Normal sinus rhythm with LBBB.  Heart rate has increased since 05/30/2021.  Otherwise, no significant interval change. ? ?Lab Results  ?Component Value Date  ? WBC 6.9 06/01/2021  ? HGB 13.8 06/01/2021  ? HCT 41.7 06/01/2021  ? MCV 87.4 06/01/2021  ? PLT 242 06/01/2021  ? ? ?Lab Results  ?Component Value Date  ? NA 135 06/01/2021  ? K 4.3  06/01/2021  ? CL 101 06/01/2021  ? CO2 23 06/01/2021  ? BUN 22 (H) 06/01/2021  ? CREATININE 0.91 06/01/2021  ? GLUCOSE 110 (H) 06/01/2021  ? ALT 23 05/29/2021  ? ? ?Lab Results  ?Component Value Date  ? CHOL 135 05/30/2021  ? HDL 45 05/30/2021  ? LDLCALC 49 05/30/2021  ? TRIG 203 (H) 05/30/2021  ? CHOLHDL 3.0 05/30/2021  ? ? ?-------------------------------------------------------------------------------------------------- ? ?ASSESSMENT AND PLAN: ?Acute HFrEF: ?Mr. Maker reports continued atypical chest pain and shortness of breath since leaving the hospital last week, when he was diagnosed with NICM with LVEF 35-40%.  He still appears mildly volume overloaded on exam and has also gained 8 pounds since 06/01/2021.  We will increase furosemide to 40 mg daily and continue current doses of metoprolol succinate and losartan, as low normal BP and reports of lightheadedness limit escalation of GDMT today.  I will check  a CMP to ensure stable renal function and electrolytes.  I encouraged Mr. Sarafian to minimize his sodium intake as well as to limit his fluid intake to 2L/day.  We will attempt to optimize his HF regimen over the next 2-3 months and then repeat an echocardiogram.  If his LVEF declines further, referral to EP will need to be pursued to discuss ICD/CRT. ? ?Atypical chest pain and right upper quadrant pain: ?Pain initially began ~2 days prior to his hospitalization after eating pizza.  Workup in the hospital, including CTA, stress test, and cardiac catheterization was notable for fixed septal wall defect on MPI consistent with LBBB-artifact, as cath did not show significant CAD.  RUQ tenderness noted on exam today.  I will check a CMP today and refer Mr. Goodrich for RUQ ultrasound to exclude hepatobiliary disease that may be contributing to his chest pain. ? ?Coronary artery disease: ?Mild coronary plaquing noted on cath.  Continue aspirin and statin therapy. ? ?Follow-up: Return to clinic in 2 weeks. ? ?Nelva Bush, MD ?06/07/2021 ?3:57 PM ? ?

## 2021-06-07 NOTE — Patient Instructions (Signed)
Medication Instructions:  ? ?Your physician has recommended you make the following change in your medication:  ? ?INCREASE Furosemide (Lasix) 40 mg daily  ? ?*If you need a refill on your cardiac medications before your next appointment, please call your pharmacy* ? ? ?Lab Work: ? ?Today at the medical mall: [facility] ? ?If you have labs (blood work) drawn today and your tests are completely normal, you will receive your results only by: ?MyChart Message (if you have MyChart) OR ?A paper copy in the mail ?If you have any lab test that is abnormal or we need to change your treatment, we will call you to review the results. ? ? ?Testing/Procedures: ? ?Your provider recommends that you have an abdominal ultrasound of your right upper quadrant due to your pain.  ? ?Please call [phone] to schedule this at [facility] ? ? ?Follow-Up: ?At [facility], you and your health needs are our priority.  As part of our continuing mission to provide you with exceptional heart care, we have created designated Provider Care Teams.  These Care Teams include your primary Cardiologist (physician) and Advanced Practice Providers (APPs -  Physician Assistants and Nurse Practitioners) who all work together to provide you with the care you need, when you need it. ? ?We recommend signing up for the patient portal called "MyChart".  Sign up information is provided on this After Visit Summary.  MyChart is used to connect with patients for Virtual Visits (Telemedicine).  Patients are able to view lab/test results, encounter notes, upcoming appointments, etc.  Non-urgent messages can be sent to your provider as well.   ?To learn more about what you can do with MyChart, go to [email or web address].   ? ?Your next appointment:   ?2 week(s) ? ?The format for your next appointment:   ?In Person ? ?Provider:   ?You may see Dr. Harrell Gave End or one of the following Advanced Practice Providers on your designated Care Team:   ?Murray Hodgkins,  NP ?Christell Faith, PA-C ?Cadence Kathlen Mody, PA-C ?

## 2021-06-08 ENCOUNTER — Encounter: Payer: Self-pay | Admitting: Internal Medicine

## 2021-06-09 ENCOUNTER — Other Ambulatory Visit: Payer: Self-pay

## 2021-06-09 ENCOUNTER — Ambulatory Visit (INDEPENDENT_AMBULATORY_CARE_PROVIDER_SITE_OTHER): Payer: Medicare HMO | Admitting: Family Medicine

## 2021-06-09 ENCOUNTER — Encounter: Payer: Self-pay | Admitting: Family Medicine

## 2021-06-09 ENCOUNTER — Inpatient Hospital Stay: Payer: Medicare HMO | Admitting: Family Medicine

## 2021-06-09 VITALS — BP 130/80 | HR 82 | Ht 73.0 in | Wt 247.2 lb

## 2021-06-09 DIAGNOSIS — I428 Other cardiomyopathies: Secondary | ICD-10-CM

## 2021-06-09 DIAGNOSIS — I5021 Acute systolic (congestive) heart failure: Secondary | ICD-10-CM | POA: Diagnosis not present

## 2021-06-09 DIAGNOSIS — R1011 Right upper quadrant pain: Secondary | ICD-10-CM

## 2021-06-09 DIAGNOSIS — I2510 Atherosclerotic heart disease of native coronary artery without angina pectoris: Secondary | ICD-10-CM

## 2021-06-09 NOTE — Progress Notes (Signed)
? ?Subjective:  ? ? Patient ID: Randy [name], male    DOB: Mar 18, 1971, 51 y.o.   MRN: [id number] ? ?Randy [name] is a 51 y.o. male presenting on 06/09/2021 for Hospitalization Follow-up ? ? ?HPI ? ?HOSPITAL FOLLOW-UP VISIT ? ?Hospital/Location: [facility] ?Date of Admission: 05/29/21 ?Date of Discharge: 06/01/21 ?Transitions of care telephone call: 06/01/21 attempted by Kirke Shaggy LPN ? ?Reason for Admission: Chest Pain  ? ?- Hospital H&P and Discharge Summary have been reviewed ?- Patient presents today 8 days  after recent hospitalization. Brief summary of recent course, patient had symptoms of chest pain and dyspnea, hospitalized, treated with cardiology management, cardiac cath with non obstructive disease and ECHO identified CHF with reduced LVEF 35-40% and fluid. He was given diuresis and outpatient follow up ? ?He has seen Dr End CVD on 06/07/21, he was having some inc edema, and volume load, and he was increased on Furosemide to $RemoveBefor'40mg'IJRZCCgQbkdg$  daily. Continued on other medications BB ARB. He will repeat ECHO in few months with diuresis. If not improving he would be referred to EP Cardiology ? ?Also had some upper R upper abdominal symptoms, and RUQ [place] was ordered to evaluate for possible biliary disease. Not having pain with meals in particular. Seems more persistent ?. ?- Today reports overall has done well after discharge. Symptoms of chest pain and dyspnea have improved ?- New medications on discharge: Lasix Furosemide $RemoveBeforeDE'40mg'lKdmJDPFBJzGpFO$  daily ? ? ?I have reviewed the discharge medication list, and have reconciled the current and discharge medications today. ? ? ?Current Outpatient Medications:  ?  albuterol (PROAIR HFA) 108 (90 Base) MCG/ACT inhaler, Inhale 1-2 puffs into the lungs every 6 (six) hours as needed for wheezing or shortness of breath., Disp: 1 g, Rfl: 3 ?  ARIPiprazole (ABILIFY) 10 MG tablet, Take 2 tablets (20 mg total) by mouth daily., Disp: 60 tablet, Rfl: 0 ?  aspirin EC 81 MG EC tablet, Take 1 tablet (81 mg total) by  mouth daily. Swallow whole., Disp: 30 tablet, Rfl: 0 ?  atorvastatin (LIPITOR) 40 MG tablet, Take 1 tablet (40 mg total) by mouth daily., Disp: 30 tablet, Rfl: 0 ?  cetirizine (ZYRTEC) 10 MG tablet, Take 10 mg by mouth daily as needed for allergies., Disp: , Rfl:  ?  divalproex (DEPAKOTE) 500 MG DR tablet, Take 2 tablets nightly, Disp: , Rfl:  ?  doxepin (SINEQUAN) 10 MG capsule, Take 2 capsules (20 mg total) by mouth at bedtime., Disp: 60 capsule, Rfl: 0 ?  FLUoxetine (PROZAC) 10 MG capsule, Take 1 capsule (10 mg total) by mouth daily., Disp: 30 capsule, Rfl: 0 ?  FLUoxetine (PROZAC) 40 MG capsule, Take 40 mg by mouth daily., Disp: , Rfl:  ?  furosemide (LASIX) 40 MG tablet, Take 1 tablet (40 mg total) by mouth daily., Disp: 30 tablet, Rfl: 0 ?  losartan (COZAAR) 25 MG tablet, Take 0.5 tablets (12.5 mg total) by mouth daily., Disp: 15 tablet, Rfl: 0 ?  metFORMIN (GLUCOPHAGE-XR) 500 MG 24 hr tablet, Take 1 tablet (500 mg total) by mouth 2 (two) times daily with a meal., Disp: 180 tablet, Rfl: 3 ?  metoprolol succinate (TOPROL-XL) 25 MG 24 hr tablet, Take 0.5 tablets (12.5 mg total) by mouth daily., Disp: 15 tablet, Rfl: 0 ?  nicotine (NICODERM CQ - DOSED IN MG/24 HOURS) 21 mg/24hr patch, Place 1 patch (21 mg total) onto the skin daily., Disp: 30 patch, Rfl: 0 ? ?------------------------------------------------------------------------- ?Social History  ? ?Tobacco Use  ? Smoking status:  Former  ?  Packs/day: 0.00  ?  Years: 0.00  ?  Pack years: 0.00  ?  Types: Cigarettes  ? Smokeless tobacco: Current  ?  Types: Chew  ?Vaping Use  ? Vaping Use: Never used  ?Substance Use Topics  ? Alcohol use: No  ? Drug use: Never  ? ? ?Review of Systems ?Per HPI unless specifically indicated above ? ?   ?Objective:  ?  ?BP 130/80   Pulse 82   Ht _0  (1.854 m)   Wt 247 lb 3.2 oz (112.1 kg)   SpO2 99%   BMI 32.61 kg/m?   ?Wt Readings from Last 3 Encounters:  ?06/09/21 247 lb 3.2 oz (112.1 kg)  ?06/07/21 243 lb (110.2 kg)   ?06/01/21 235 lb 14.3 oz (107 kg)  ?  ?Physical Exam ?Vitals and nursing note reviewed.  ?Constitutional:   ?   General: He is not in acute distress. ?   Appearance: He is well-developed. He is not diaphoretic.  ?   Comments: Well-appearing, comfortable, cooperative  ?HENT:  ?   Head: Normocephalic and atraumatic.  ?Eyes:  ?   General:     ?   Right eye: No discharge.     ?   Left eye: No discharge.  ?   Conjunctiva/sclera: Conjunctivae normal.  ?Neck:  ?   Thyroid: No thyromegaly.  ?Cardiovascular:  ?   Rate and Rhythm: Normal rate and regular rhythm.  ?   Pulses: Normal pulses.  ?   Heart sounds: Normal heart sounds. No murmur heard. ?Pulmonary:  ?   Effort: Pulmonary effort is normal. No respiratory distress.  ?   Breath sounds: Normal breath sounds. No wheezing or rales.  ?Musculoskeletal:     ?   General: Normal range of motion.  ?   Cervical back: Normal range of motion and neck supple.  ?   Right lower leg: No edema.  ?   Left lower leg: No edema.  ?Lymphadenopathy:  ?   Cervical: No cervical adenopathy.  ?Skin: ?   General: Skin is warm and dry.  ?   Findings: No erythema or rash.  ?Neurological:  ?   Mental Status: He is alert and oriented to person, place, and time. Mental status is at baseline.  ?Psychiatric:     ?   Behavior: Behavior normal.  ?   Comments: Well groomed, good eye contact, normal speech and thoughts  ? ? ? ? ?Results for orders placed or performed during the hospital encounter of 06/07/21  ?Comp Met (CMET)  ?Result Value Ref Range  ? Sodium 136 135 - 145 mmol/L  ? Potassium 3.6 3.5 - 5.1 mmol/L  ? Chloride 99 98 - 111 mmol/L  ? CO2 26 22 - 32 mmol/L  ? Glucose, Bld 126 (H) 70 - 99 mg/dL  ? BUN 15 6 - 20 mg/dL  ? Creatinine, Ser 1.07 0.61 - 1.24 mg/dL  ? Calcium 9.6 8.9 - 10.3 mg/dL  ? Total Protein 8.0 6.5 - 8.1 g/dL  ? Albumin 4.2 3.5 - 5.0 g/dL  ? AST 32 15 - 41 U/L  ? ALT 23 0 - 44 U/L  ? Alkaline Phosphatase 54 38 - 126 U/L  ? Total Bilirubin 0.5 0.3 - 1.2 mg/dL  ? GFR, Estimated >60  >60 mL/min  ? Anion gap 11 5 - 15  ? ?   ?Assessment & Plan:  ? ?Problem List Items Addressed This Visit   ? ? RUQ pain  ?  Nonischemic cardiomyopathy ([place])  ? CAD in native artery  ? Acute HFrEF (heart failure with reduced ejection fraction) ([place]) - Primary  ?  ?Improved volume status today ?However weight is up, no edema on exam ?Lungs clear ?BP in normal range ?Hemodynamic stable ? ?[facility] record and last Cardiology note 2 days ago ?Agree with diuresis and management to improve LVEF, goal upcoming Repeat ECHO ?History is not entirely suggestive of biliary etiology today, not triggered by meals, but certainly agree with investigating / US imaging ? ?Will follow results ? ?No orders of the defined types were placed in this encounter. ? ? ?Follow up plan: ?Return if symptoms worsen or fail to improve. ? ? ?[name], DO ?[facility] ?[facility] ?06/09/2021, 9:40 AM ?

## 2021-06-09 NOTE — Patient Instructions (Addendum)
Thank you for coming to the office today. ? ?Keep on current dose Furosemide Lasix '40mg'$  daily ? ?Continue to follow with Cardiology for repeat ECHO and abdominal ultrasound. ? ?Please schedule a Follow-up Appointment to: Return if symptoms worsen or fail to improve. ? ?If you have any other questions or concerns, please feel free to call the office or send a message through [place]. You may also schedule an earlier appointment if necessary. ? ?Additionally, you may be receiving a survey about your experience at our office within a few days to 1 week by e-mail or mail. We value your feedback. ? ?[name], DO ?[place] ?

## 2021-06-14 ENCOUNTER — Telehealth: Payer: Self-pay

## 2021-06-14 DIAGNOSIS — F200 Paranoid schizophrenia: Secondary | ICD-10-CM

## 2021-06-14 NOTE — Telephone Encounter (Signed)
Please review for Dr. Raliegh Ip. ? ?Thanks,  ? ?-Mickel Baas  ?

## 2021-06-14 NOTE — Telephone Encounter (Signed)
Copied from [place] [id number]. Topic: Referral - Request for Referral ?>> Jun 12, 2021  2:27 PM Alanda Slim E wrote: ?Has patient seen PCP for this complaint? Yes  ?*If NO, is insurance requiring patient see PCP for this issue before PCP can refer them? ?Referral for which specialty: psychiatric  ?Preferred provider/office: [facility] / phone# [phone]/ [address] ?Reason for referral: Was going to [facility] and could do virtual appts so he was dismissed due to not understanding how to do virtual visits ?

## 2021-06-15 NOTE — Addendum Note (Signed)
Addended by: Jearld Fenton on: 06/15/2021 05:43 AM ? ? Modules accepted: Orders ? ?

## 2021-06-15 NOTE — Telephone Encounter (Signed)
Referral placed.

## 2021-06-16 ENCOUNTER — Telehealth: Payer: Self-pay | Admitting: *Deleted

## 2021-06-16 NOTE — Telephone Encounter (Signed)
Attempted to call pt. No answer. Lmtcb.  ? ?Calling pt to ask if he needs assistance scheduling abdominal ultrasound.  ?Central scheduling number given to pt at office visit 06/07/21 to call to schedule for RUQ pain.  ?Pt voiced he would call to schedule.  ?

## 2021-06-22 ENCOUNTER — Ambulatory Visit: Payer: Medicare HMO | Admitting: Medical

## 2021-06-27 ENCOUNTER — Telehealth: Payer: Self-pay | Admitting: Internal Medicine

## 2021-06-27 NOTE — Telephone Encounter (Signed)
Patient cancelled fu appts and never scheduled RUQ u/s .  States he is seeing new cardiologist at [facility] .  ? ?U/s order cancelled ?

## 2021-06-30 ENCOUNTER — Other Ambulatory Visit: Payer: Self-pay | Admitting: Family Medicine

## 2021-06-30 ENCOUNTER — Other Ambulatory Visit: Payer: Self-pay | Admitting: Internal Medicine

## 2021-06-30 ENCOUNTER — Encounter: Payer: Self-pay | Admitting: Family Medicine

## 2021-06-30 DIAGNOSIS — I2510 Atherosclerotic heart disease of native coronary artery without angina pectoris: Secondary | ICD-10-CM

## 2021-06-30 DIAGNOSIS — F200 Paranoid schizophrenia: Secondary | ICD-10-CM

## 2021-06-30 DIAGNOSIS — E1169 Type 2 diabetes mellitus with other specified complication: Secondary | ICD-10-CM

## 2021-06-30 MED ORDER — METOPROLOL SUCCINATE ER 25 MG PO TB24: 12.5000 mg | ORAL_TABLET | Freq: Every day | ORAL | 1 refills | Status: DC

## 2021-06-30 MED ORDER — ARIPIPRAZOLE 10 MG PO TABS: 20.0000 mg | ORAL_TABLET | Freq: Every day | ORAL | 1 refills | Status: AC

## 2021-06-30 MED ORDER — ATORVASTATIN CALCIUM 40 MG PO TABS: 40.0000 mg | ORAL_TABLET | Freq: Every day | ORAL | 1 refills | Status: DC

## 2021-06-30 MED ORDER — LOSARTAN POTASSIUM 25 MG PO TABS: 12.5000 mg | ORAL_TABLET | Freq: Every day | ORAL | 1 refills | Status: DC

## 2021-07-03 MED ORDER — FLUOXETINE 40 MG CAPSULE
ORAL_CAPSULE | Freq: Every day | ORAL | 2 refills | 30 days | Status: CP
Start: 2021-07-03 — End: 2021-10-01

## 2021-07-03 MED ORDER — ATORVASTATIN 20 MG TABLET
ORAL_TABLET | ORAL | 1 refills | 0 days | Status: CP
Start: 2021-07-03 — End: 2022-07-03

## 2021-07-12 ENCOUNTER — Other Ambulatory Visit: Payer: Self-pay

## 2021-07-13 DIAGNOSIS — R69 Illness, unspecified: Secondary | ICD-10-CM | POA: Diagnosis not present

## 2021-07-13 DIAGNOSIS — F259 Schizoaffective disorder, unspecified: Secondary | ICD-10-CM | POA: Diagnosis not present

## 2021-07-13 DIAGNOSIS — Z79899 Other long term (current) drug therapy: Secondary | ICD-10-CM | POA: Diagnosis not present

## 2021-07-13 DIAGNOSIS — Z5181 Encounter for therapeutic drug level monitoring: Secondary | ICD-10-CM | POA: Diagnosis not present

## 2021-07-14 ENCOUNTER — Ambulatory Visit: Payer: Medicare HMO | Admitting: Nurse Practitioner

## 2021-07-21 ENCOUNTER — Emergency Department: Payer: Medicare HMO

## 2021-07-21 ENCOUNTER — Emergency Department
Admission: EM | Admit: 2021-07-21 | Discharge: 2021-07-22 | Disposition: A | Discharge status: Home / Self Care | Payer: Medicare HMO | Attending: Emergency Medicine | Admitting: Emergency Medicine

## 2021-07-21 ENCOUNTER — Other Ambulatory Visit: Payer: Self-pay

## 2021-07-21 DIAGNOSIS — R079 Chest pain, unspecified: Secondary | ICD-10-CM | POA: Diagnosis not present

## 2021-07-21 DIAGNOSIS — R110 Nausea: Secondary | ICD-10-CM | POA: Diagnosis not present

## 2021-07-21 DIAGNOSIS — R0602 Shortness of breath: Secondary | ICD-10-CM | POA: Diagnosis not present

## 2021-07-21 DIAGNOSIS — R0789 Other chest pain: Secondary | ICD-10-CM | POA: Diagnosis not present

## 2021-07-21 DIAGNOSIS — Z20822 Contact with and (suspected) exposure to covid-19: Secondary | ICD-10-CM | POA: Insufficient documentation

## 2021-07-21 DIAGNOSIS — J449 Chronic obstructive pulmonary disease, unspecified: Secondary | ICD-10-CM | POA: Diagnosis not present

## 2021-07-21 DIAGNOSIS — E119 Type 2 diabetes mellitus without complications: Secondary | ICD-10-CM | POA: Insufficient documentation

## 2021-07-21 LAB — BASIC METABOLIC PANEL
Anion gap: 10 (ref 5–15)
BUN: 25 mg/dL — ABNORMAL HIGH (ref 6–20)
CO2: 22 mmol/L (ref 22–32)
Calcium: 8.7 mg/dL — ABNORMAL LOW (ref 8.9–10.3)
Chloride: 104 mmol/L (ref 98–111)
Creatinine, Ser: 1.07 mg/dL (ref 0.61–1.24)
GFR, Estimated: >60 mL/min (ref >60)
Glucose, Bld: 173 mg/dL — ABNORMAL HIGH (ref 70–99)
Potassium: 3.8 mmol/L (ref 3.5–5.1)
Sodium: 136 mmol/L (ref 135–145)

## 2021-07-21 LAB — TROPONIN I (HIGH SENSITIVITY)
Troponin I (High Sensitivity): 7 ng/L (ref <18)
Troponin I (High Sensitivity): 7 ng/L (ref <18)

## 2021-07-21 LAB — CBC
HCT: 39.4 % (ref 39.0–52.0)
Hemoglobin: 12.9 g/dL — ABNORMAL LOW (ref 13.0–17.0)
MCH: 28.9 pg (ref 26.0–34.0)
MCHC: 32.7 g/dL (ref 30.0–36.0)
MCV: 88.1 fL (ref 80.0–100.0)
Platelets: 169 10*3/uL (ref 150–400)
RBC: 4.47 MIL/uL (ref 4.22–5.81)
RDW: 14.2 % (ref 11.5–15.5)
WBC: 5.9 10*3/uL (ref 4.0–10.5)
nRBC: 0 % (ref 0.0–0.2)

## 2021-07-21 LAB — RESP PANEL BY RT-PCR (FLU A&B, COVID) ARPGX2
Influenza A by PCR: NEGATIVE
Influenza B by PCR: NEGATIVE
SARS Coronavirus 2 by RT PCR: NEGATIVE

## 2021-07-21 NOTE — ED Provider Notes (Signed)
? ?[facility] ?Provider Note ? ? ? Event Date/Time  ? First MD Initiated Contact with Patient 07/21/21 2118   ?  (approximate) ? ? ?History  ? ?Chest Pain ? ? ?HPI ? ?Randy [name] is a 51 y.o. male with history of OSA, COPD, type 2 diabetes, hyperlipidemia, schizophrenia who presents with chest pain since last night.  He describes it as pressure-like, but also worse with movements or if he touches the chest.  He has some associated shortness of breath and nausea.  He states it is similar to the pain he had when he was in the ED last month, and he has had it several times in between. ? ? ? ?Physical Exam  ? ?Triage Vital Signs: ?ED Triage Vitals  ?Enc Vitals Group  ?   BP 07/21/21 1954 136/81  ?   Pulse Rate 07/21/21 1954 79  ?   Resp 07/21/21 1954 20  ?   Temp 07/21/21 1954 99.7 ?F (37.6 ?C)  ?   Temp Source 07/21/21 1954 Oral  ?   SpO2 07/21/21 1954 95 %  ?   Weight 07/21/21 1751 255 lb (115.7 kg)  ?   Height 07/21/21 1751 '5\' 11"'$  (1.803 m)  ?   Head Circumference --   ?   Peak Flow --   ?   Pain Score 07/21/21 1751 9  ?   Pain Loc --   ?   Pain Edu? --   ?   Excl. in [place]? --   ? ? ?Most recent vital signs: ?Vitals:  ? 07/21/21 2114 07/21/21 2228  ?BP: 125/72 120/70  ?Pulse: 72 68  ?Resp: 17 18  ?Temp:    ?SpO2: 97% 96%  ? ? ? ?General: Alert and oriented, comfortable appearing. ?CV:  Good peripheral perfusion.  Normal heart sounds. ?Resp:  Normal effort.  Lungs CTAB. ?Abd:  No distention.  ?Other:  No calf or popliteal swelling or tenderness.  Anterior chest wall with mild tenderness to palpation, reproducing the pain. ? ?ED Results / Procedures / Treatments  ? ?Labs ?(all labs ordered are listed, but only abnormal results are displayed) ?Labs Reviewed  ?BASIC METABOLIC PANEL - Abnormal; Notable for the following components:  ?    Result Value  ? Glucose, Bld 173 (*)   ? BUN 25 (*)   ? Calcium 8.7 (*)   ? All other components within normal limits  ?CBC - Abnormal; Notable for the following  components:  ? Hemoglobin 12.9 (*)   ? All other components within normal limits  ?RESP PANEL BY RT-PCR (FLU A&B, COVID) [id number]  ?TROPONIN I (HIGH SENSITIVITY)  ?TROPONIN I (HIGH SENSITIVITY)  ? ? ? ?EKG ? ?ED ECG REPORT ?IArta Silence, the attending physician, personally viewed and interpreted this ECG. ? ?Date: 07/21/2021 ?EKG Time: 1753 ?Rate: 95 ?Rhythm: normal sinus rhythm ?QRS Axis: normal ?Intervals: LBBB ?ST/T Wave abnormalities: normal ?Narrative Interpretation: no evidence of acute ischemia; no significant change when compared to EKG of 06/07/2021 ? ? ? ?RADIOLOGY ? ?Chest x-ray: I independently viewed and interpreted the images; there is no focal consolidation or edema ? ?PROCEDURES: ? ?Critical Care performed: No ? ?Procedures ? ? ?MEDICATIONS ORDERED IN ED: ?Medications - No data to display ? ? ?IMPRESSION / MDM / ASSESSMENT AND PLAN / ED COURSE  ?I reviewed the triage vital signs and the nursing notes. ? ?51 year old male with PMH as noted above presents with chest pain since last night which has also  been intermittent over the last few weeks. ? ?I reviewed the past medical records.  The patient was seen in the ED with a similar presentation last month, however he also had an elevated lactate and was admitted.  CTA was negative for PE.  Per the hospitalist discharge summary from 3/9, the patient was diagnosed with UTI as well as acute on chronic CHF and got diuresis.  He sees Dr. Saunders Revel from cardiology.  Per the cardiology note from 3/15, he also had a cardiac catheterization on 3/8 which showed nonobstructive coronary disease.  He also reported chronic ongoing chest pain at that time. ? ?Given the reproducible nature of the pain, overall presentation is most consistent with musculoskeletal pain, GERD, or other benign etiology.  I have a low suspicion for ACS given the negative recent cardiac catheterization and the somewhat chronic nature of the symptoms.  I also do not suspect PE, aortic  dissection, or other vascular etiology given the lack of tachycardia or hypoxia and the intermittent and relatively mild nature of the pain. ? ?EKG is nonischemic. ? ?We will obtain basic labs, cardiac enzymes x2, respiratory panel, and reassess. ? ?The patient is on the cardiac monitor to evaluate for evidence of arrhythmia and/or significant heart rate changes. ? ?----------------------------------------- ?11:54 PM on 07/21/2021 ?----------------------------------------- ? ?CBC and CMP are unremarkable.  Troponins are negative x2.  Respiratory panel is negative.  Chest x-ray shows no acute abnormality. ? ?On reassessment, the patient continues to appear comfortable.  He has not required any pain medication in the ED.  At this time he is stable for discharge home.  I counseled him on the results of the work-up.  He feels comfortable going home.  He will follow-up with Dr. Saunders Revel.  Return precautions given, and he expresses understanding. ? ? ?FINAL CLINICAL IMPRESSION(S) / ED DIAGNOSES  ? ?Final diagnoses:  ?Chest pain, unspecified type  ? ? ? ?Rx / DC Orders  ? ?ED Discharge Orders   ? ? None  ? ?  ? ? ? ?Note:  This document was prepared using Dragon voice recognition software and may include unintentional dictation errors.  ?  Arta Silence, MD ?07/21/21 2355 ? ?

## 2021-07-21 NOTE — Discharge Instructions (Addendum)
Your tests today are not showing any signs of strain on your heart.  Your chest x-ray is clear and the COVID and flu tests are negative.  You may take ibuprofen or Tylenol as needed for the pain.  Follow-up with your primary care doctor and with Dr. Saunders Revel.  Return to the ER for new, worsening, or persistent severe pain, difficulty breathing, weakness or lightheadedness, or any other new or worsening symptoms that concern you. ?

## 2021-07-21 NOTE — ED Triage Notes (Signed)
Pt arrives with c/o chest pain that started last night. Pt endorses SOB, nausea, and lightheadedness.  ?

## 2021-07-27 ENCOUNTER — Ambulatory Visit: Payer: Medicare HMO | Admitting: Medical

## 2021-07-27 NOTE — Progress Notes (Deleted)
Cardiology Office Note:    Date:  07/27/2021   ID:  Randy [name], DOB 12-Nov-1970, MRN [id number]  PCP:  Olin Hauser, DO  [facility] Cardiologist:  Nelva Bush, MD  [facility] Electrophysiologist:  None   Referring MD: [name] *   Chief Complaint: ER follow-up  History of Present Illness:    Randy [name] is a 51 y.o. male with a hx of recently diagnosed HFrEF due to nonischemic cardiomyopathy, chronic LBBB, hyperlipidemia, type 2 diabetes mellitus, COPD, schizophrenia, and obstructive sleep apnea not on CPAP, who presents for ER follow-up.  Mr. Deakin presented to the [facility] emergency department on 05/29/2021 complaining of a 2-day history of chest pain.  He underwent stress testing and echocardiography, which showed moderately reduced LVEF and a fixed septal defect on perfusion imaging.  He subsequently underwent cardiac catheterization that showed mild, nonobstructive CAD, upper normal to mildly elevated left heart filling pressures, and moderately-severely elevated right heart filling pressures.  Was discharged on low-dose furosemide as well as goal-directed medical therapy with metoprolol succinate 12.5 mg daily and losartan 12.5 mg daily.  Last seen 06/07/21 and was volume overloaded and lasix was increased to '40mg'$  daily. He had atypical chest pain and was referred for RUQ to exclude hepatobiliary disease.   ER visit 07/21/21 for chest pain. Vitals wnl. CXR was normal. Pain was reproducible on exam.HS troponin was negative.   Today,   Past Medical History:  Diagnosis Date   Allergy    Bipolar 1 disorder (HCC)    COPD (chronic obstructive pulmonary disease) (HCC)    GERD (gastroesophageal reflux disease)    LBBB (left bundle branch block)    Nonischemic cardiomyopathy ([place]) 05/30/2021   LVEF 35-40%; mild, non-obstructive CAD by LHC.    Past Surgical History:  Procedure Laterality Date   RIGHT/LEFT HEART CATH AND CORONARY ANGIOGRAPHY N/A  05/31/2021   Procedure: RIGHT/LEFT HEART CATH AND CORONARY ANGIOGRAPHY;  Surgeon: Nelva Bush, MD;  Location: [facility];  Service: Cardiovascular;  Laterality: N/A;    Current Medications: No outpatient medications have been marked as taking for the 07/27/21 encounter (Appointment) with Kathlen Mody, Tahira Olivarez H, PA-C.     Allergies:   Patient has no known allergies.   Social History   Socioeconomic History   Marital status: Married    Spouse name: Not on file   Number of children: Not on file   Years of education: Not on file   Highest education level: 11th grade  Occupational History   Occupation: disability   Tobacco Use   Smoking status: Former    Packs/day: 0.00    Years: 0.00    Pack years: 0.00    Types: Cigarettes   Smokeless tobacco: Current    Types: Chew  Vaping Use   Vaping Use: Never used  Substance and Sexual Activity   Alcohol use: No   Drug use: Never   Sexual activity: Yes  Other Topics Concern   Not on file  Social History Narrative   Ongoing financial constraints mentioned during phone call today   Social Determinants of Health   Financial Resource Strain: Low Risk    Difficulty of Paying Living Expenses: Not hard at all  Food Insecurity: No Food Insecurity   Worried About Charity fundraiser in the Last Year: Never true   [place] in the Last Year: Never true  Transportation Needs: No Transportation Needs   Lack of Transportation (Medical): No   Lack  of Transportation (Non-Medical): No  Physical Activity: Insufficiently Active   Days of Exercise per Week: 3 days   Minutes of Exercise per Session: 40 min  Stress: No Stress Concern Present   Feeling of Stress : Not at all  Social Connections: Not on file     Family History: The patient's family history includes Alcohol abuse in his mother; Cancer in his father; Colon cancer in his father.  ROS:   Please see the history of present illness.     All other systems reviewed and are  negative.  EKGs/Labs/Other Studies Reviewed:    The following studies were reviewed today:  R/L Cardiac cath 05/2021 Conclusions: Mild, nonobstructive coronary artery disease with 15-20% stenosis involving the proximal LAD and ostial/proximal ramus intermedius. Upper normal to mildly elevated left heart filling pressures. Moderate-severely elevated right heart filling pressures. Mild pulmonary hypertension. Low normal to mildly reduced Fick cardiac output/index.   Recommendations: Consider gentle diuresis in the setting of elevated filling pressures, as outlined above. Escalate goal-directed medical therapy for nonischemic cardiomyopathy. Encourage further work-up/management of sleep apnea, including compliance with CPAP.   Nelva Bush, MD [facility]  Echo 05/2021  1. Left ventricular ejection fraction, by estimation, is 35 to 40%. The  left ventricle has moderately decreased function. The left ventricle  demonstrates global hypokinesis. Left ventricular diastolic parameters are  consistent with Grade I diastolic  dysfunction (impaired relaxation).   2. Right ventricular systolic function is normal. The right ventricular  size is normal. Tricuspid regurgitation signal is inadequate for assessing  PA pressure.   3. The mitral valve is normal in structure. No evidence of mitral valve  regurgitation.   4. Question restricted movement of the right coronary leaflet. The aortic  valve is tricuspid. Aortic valve regurgitation is mild. No aortic stenosis  is present.   5. The inferior vena cava is normal in size with greater than 50%  respiratory variability, suggesting right atrial pressure of 3 mmHg.   EKG:  EKG is *** ordered today.  The ekg ordered today demonstrates ***  Recent Labs: 05/29/2021: B Natriuretic Peptide 20.5 05/30/2021: TSH 2.440 06/07/2021: ALT 23 07/21/2021: BUN 25; Creatinine, Ser 1.07; Hemoglobin 12.9; Platelets 169; Potassium 3.8; Sodium 136  Recent Lipid  Panel    Component Value Date/Time   CHOL 135 05/30/2021 0203   TRIG 203 (H) 05/30/2021 0203   HDL 45 05/30/2021 0203   CHOLHDL 3.0 05/30/2021 0203   VLDL 41 (H) 05/30/2021 0203   LDLCALC 49 05/30/2021 0203   LDLCALC 52 11/08/2020 0923     Risk Assessment/Calculations:   {Does this patient have ATRIAL FIBRILLATION?[phone]}   Physical Exam:    VS:  There were no vitals taken for this visit.    Wt Readings from Last 3 Encounters:  07/21/21 255 lb (115.7 kg)  06/09/21 247 lb 3.2 oz (112.1 kg)  06/07/21 243 lb (110.2 kg)     GEN: *** Well nourished, well developed in no acute distress HEENT: Normal NECK: No JVD; No carotid bruits LYMPHATICS: No lymphadenopathy CARDIAC: ***RRR, no murmurs, rubs, gallops RESPIRATORY:  Clear to auscultation without rales, wheezing or rhonchi  ABDOMEN: Soft, non-tender, non-distended MUSCULOSKELETAL:  No edema; No deformity  SKIN: Warm and dry NEUROLOGIC:  Alert and oriented x 3 PSYCHIATRIC:  Normal affect   ASSESSMENT:    No diagnosis found. PLAN:    In order of problems listed above:  Chest pain  HFrEF  Nonobstructive CAD  Disposition: Follow up {follow up:15908} with ***  Shared Decision Making/Informed Consent   {Are you ordering a CV Procedure (e.g. stress test, cath, DCCV, TEE, etc)?   Press F2        :[id number]}    Signed, Shahin Knierim Ninfa Meeker, PA-C  07/27/2021 1:10 PM    [facility]

## 2021-08-09 ENCOUNTER — Ambulatory Visit: Admit: 2021-08-09 | Discharge: 2021-08-10 | Payer: MEDICARE | Attending: Adult Health | Primary: Adult Health

## 2021-08-09 DIAGNOSIS — R0602 Shortness of breath: Secondary | ICD-10-CM | POA: Diagnosis not present

## 2021-08-09 DIAGNOSIS — I2510 Atherosclerotic heart disease of native coronary artery without angina pectoris: Secondary | ICD-10-CM | POA: Diagnosis not present

## 2021-08-09 DIAGNOSIS — I5022 Chronic systolic (congestive) heart failure: Secondary | ICD-10-CM | POA: Diagnosis not present

## 2021-08-09 MED ORDER — SPIRONOLACTONE 25 MG TABLET
ORAL_TABLET | Freq: Every day | ORAL | 6 refills | 30 days | Status: CP
Start: 2021-08-09 — End: ?

## 2021-08-09 MED ORDER — METOPROLOL SUCCINATE ER 25 MG TABLET,EXTENDED RELEASE 24 HR
ORAL_TABLET | Freq: Every day | ORAL | 11 refills | 30 days | Status: CP
Start: 2021-08-09 — End: ?

## 2021-08-30 DIAGNOSIS — Z79899 Other long term (current) drug therapy: Secondary | ICD-10-CM | POA: Diagnosis not present

## 2021-08-30 DIAGNOSIS — F251 Schizoaffective disorder, depressive type: Secondary | ICD-10-CM | POA: Diagnosis not present

## 2021-08-30 DIAGNOSIS — R69 Illness, unspecified: Secondary | ICD-10-CM | POA: Diagnosis not present

## 2021-08-30 DIAGNOSIS — F250 Schizoaffective disorder, bipolar type: Secondary | ICD-10-CM | POA: Diagnosis not present

## 2021-08-30 DIAGNOSIS — Z5181 Encounter for therapeutic drug level monitoring: Secondary | ICD-10-CM | POA: Diagnosis not present

## 2021-09-06 ENCOUNTER — Telehealth: Payer: Self-pay

## 2021-09-06 NOTE — Telephone Encounter (Signed)
Copied from [place] [id number]. Topic: General - Other >> Sep 06, 2021  8:43 AM [place] B wrote: Reason for CRM:pt called in to have orders put in to have blood drawn.

## 2021-09-06 NOTE — Telephone Encounter (Signed)
I don't see any upcoming apts with me scheduled and I see last blood was done in March 2023.  What labs is he requesting and when?  [name], [facility] 09/06/2021, 10:01 AM

## 2021-09-06 NOTE — Telephone Encounter (Signed)
Pts wife called in stating [facility] in [place] did his labs and saw some alarming results, and wanted pts PCP do a redo on the labs to check on that, please advise.

## 2021-09-07 ENCOUNTER — Encounter: Payer: Self-pay | Admitting: Emergency Medicine

## 2021-09-07 ENCOUNTER — Emergency Department: Payer: Medicare HMO

## 2021-09-07 ENCOUNTER — Emergency Department
Admission: EM | Admit: 2021-09-07 | Discharge: 2021-09-08 | Disposition: A | Discharge status: Home / Self Care | Payer: Medicare HMO | Attending: Student in an Organized Health Care Education/Training Program | Admitting: Student in an Organized Health Care Education/Training Program

## 2021-09-07 DIAGNOSIS — I428 Other cardiomyopathies: Secondary | ICD-10-CM | POA: Insufficient documentation

## 2021-09-07 DIAGNOSIS — R0789 Other chest pain: Secondary | ICD-10-CM | POA: Diagnosis not present

## 2021-09-07 DIAGNOSIS — R002 Palpitations: Secondary | ICD-10-CM

## 2021-09-07 DIAGNOSIS — R079 Chest pain, unspecified: Secondary | ICD-10-CM | POA: Diagnosis not present

## 2021-09-07 LAB — BASIC METABOLIC PANEL
Anion gap: 9 (ref 5–15)
BUN: 17 mg/dL (ref 6–20)
CO2: 25 mmol/L (ref 22–32)
Calcium: 9.6 mg/dL (ref 8.9–10.3)
Chloride: 104 mmol/L (ref 98–111)
Creatinine, Ser: 0.79 mg/dL (ref 0.61–1.24)
GFR, Estimated: >60 mL/min (ref >60)
Glucose, Bld: 136 mg/dL — ABNORMAL HIGH (ref 70–99)
Potassium: 4 mmol/L (ref 3.5–5.1)
Sodium: 138 mmol/L (ref 135–145)

## 2021-09-07 LAB — CBC
HCT: 34.4 % — ABNORMAL LOW (ref 39.0–52.0)
Hemoglobin: 11.7 g/dL — ABNORMAL LOW (ref 13.0–17.0)
MCH: 29.7 pg (ref 26.0–34.0)
MCHC: 34 g/dL (ref 30.0–36.0)
MCV: 87.3 fL (ref 80.0–100.0)
Platelets: 212 10*3/uL (ref 150–400)
RBC: 3.94 MIL/uL — ABNORMAL LOW (ref 4.22–5.81)
RDW: 14 % (ref 11.5–15.5)
WBC: 5.1 10*3/uL (ref 4.0–10.5)
nRBC: 0 % (ref 0.0–0.2)

## 2021-09-07 LAB — PROTIME-INR
INR: 1 (ref 0.8–1.2)
Prothrombin Time: 12.9 seconds (ref 11.4–15.2)

## 2021-09-07 LAB — BRAIN NATRIURETIC PEPTIDE: B Natriuretic Peptide: 20.6 pg/mL (ref 0.0–100.0)

## 2021-09-07 LAB — TROPONIN I (HIGH SENSITIVITY): Troponin I (High Sensitivity): 9 ng/L (ref <18)

## 2021-09-07 MED ORDER — ONDANSETRON HCL 4 MG/2ML IJ SOLN
4.0000 mg | Freq: Once | INTRAMUSCULAR | Status: AC
  Administered 2021-09-07: 4 mg via INTRAVENOUS
  Filled 2021-09-07: qty 2

## 2021-09-07 MED ORDER — MORPHINE SULFATE (PF) 4 MG/ML IV SOLN
4.0000 mg | INTRAVENOUS | Status: DC | PRN
  Administered 2021-09-07: 4 mg via INTRAVENOUS
  Filled 2021-09-07: qty 1

## 2021-09-07 NOTE — Discharge Instructions (Addendum)
Please seek medical attention for any high fevers, chest pain, shortness of breath, change in behavior, persistent vomiting, bloody stool or any other new or concerning symptoms.  

## 2021-09-07 NOTE — ED Triage Notes (Signed)
Pt presents via POV with complaints of left sided CP that started earlier today and has gotten worse over the last several  hours. He states its feels like "my heart is racing" - endorses a cardiac hx and has been compliant with his medications. Denies N/V or dizziness.

## 2021-09-07 NOTE — ED Provider Notes (Signed)
[facility] Provider Note    Event Date/Time   First MD Initiated Contact with Patient 09/07/21 2043     (approximate)   History   Chest Pain   HPI  Randy [name] is a 51 y.o. male with a history of nonischemic cardiomyopathy presents to the ER for evaluation of chest discomfort as well as some palpitations.  Symptoms been going on for few days.  Became worse this evening.  Denies any nausea or vomiting no abdominal pain.  No lower extremity swelling.  Denies any cough or shortness of breath.  He is on anticoagulation and has been compliant with his medications.     Physical Exam   Triage Vital Signs: ED Triage Vitals  Enc Vitals Group     BP 09/07/21 2035 137/89     Pulse Rate 09/07/21 2035 (!) 101     Resp 09/07/21 2035 17     Temp 09/07/21 2035 98.4 F (36.9 C)     Temp Source 09/07/21 2035 Oral     SpO2 09/07/21 2035 95 %     Weight 09/07/21 2033 246 lb (111.6 kg)     Height 09/07/21 2033 '5\' 11"'$  (1.803 m)     Head Circumference --      Peak Flow --      Pain Score 09/07/21 2033 8     Pain Loc --      Pain Edu? --      Excl. in [place]? --     Most recent vital signs: Vitals:   09/07/21 2035 09/07/21 2135  BP: 137/89 131/74  Pulse: (!) 101 85  Resp: 17 20  Temp: 98.4 F (36.9 C)   SpO2: 95% 97%     Constitutional: Alert  Eyes: Conjunctivae are normal.  Head: Atraumatic. Nose: No congestion/rhinnorhea. Mouth/Throat: Mucous membranes are moist.   Neck: Painless ROM.  Cardiovascular:   Good peripheral circulation.no m/g/r Respiratory: Normal respiratory effort.  No retractions.  Gastrointestinal: Soft and nontender.  Musculoskeletal:  no deformity Neurologic:  MAE spontaneously. No gross focal neurologic deficits are appreciated.  Skin:  Skin is warm, dry and intact. No rash noted. Psychiatric: Mood and affect are normal. Speech and behavior are normal.    ED Results / Procedures / Treatments   Labs (all labs ordered are  listed, but only abnormal results are displayed) Labs Reviewed  BASIC METABOLIC PANEL - Abnormal; Notable for the following components:      Result Value   Glucose, Bld 136 (*)    All other components within normal limits  CBC - Abnormal; Notable for the following components:   RBC 3.94 (*)    Hemoglobin 11.7 (*)    HCT 34.4 (*)    All other components within normal limits  PROTIME-INR  BRAIN NATRIURETIC PEPTIDE  TROPONIN I (HIGH SENSITIVITY)  TROPONIN I (HIGH SENSITIVITY)     EKG  ED ECG REPORT I, Merlyn Lot, the attending physician, personally viewed and interpreted this ECG.   Date: 09/07/2021  EKG Time: 20:35  Rate: 100  Rhythm: sinus  Axis: normal  Intervals: lbbb  ST&T Change: no stemi,, no sgarbossa, no significant changes from previous 07/24/21    RADIOLOGY Please see ED Course for my review and interpretation.  I personally reviewed all radiographic images ordered to evaluate for the above acute complaints and reviewed radiology reports and findings.  These findings were personally discussed with the patient.  Please see medical record for radiology report.  PROCEDURES:  Critical Care performed: No  Procedures   MEDICATIONS ORDERED IN ED: Medications  morphine (PF) 4 MG/ML injection 4 mg (4 mg Intravenous Given 09/07/21 2137)  ondansetron (ZOFRAN) injection 4 mg (4 mg Intravenous Given 09/07/21 2136)     IMPRESSION / MDM / ASSESSMENT AND PLAN / ED COURSE  I reviewed the triage vital signs and the nursing notes.                              Differential diagnosis includes, but is not limited to, ACS, pericarditis, esophagitis, boerhaaves, pe, dissection, pna, bronchitis, costochondritis  With a history of cardiomyopathy presents to the ER for evaluation of symptoms as described above.  He is clinically well-appearing but given his history, This presenting complaint could reflect a potentially life-threatening illness therefore the patient will  be placed on continuous pulse oximetry and telemetry for monitoring.  Laboratory evaluation will be sent to evaluate for the above complaints.      Clinical Course as of 09/07/21 2308  Thu Sep 07, 2021  2113 Chest x-ray on my interpretation without evidence of effusions. [PR]  2246 Reassessed.  BNP is normal troponin negative.  He is on anticoagulation therefore not consistent with PE.  He is pain-free.  Will observe for repeat troponin and if negative do feel patient will be appropriate for outpatient follow-up. [PR]  2306 Will be signed out to oncoming physician pending follow-up repeat troponin.  If negative do feel patient will be appropriate for outpatient follow-up. [PR]    Clinical Course User Index [PR] Merlyn Lot, MD     FINAL CLINICAL IMPRESSION(S) / ED DIAGNOSES   Final diagnoses:  Palpitations     Rx / DC Orders   ED Discharge Orders     None        Note:  This document was prepared using Dragon voice recognition software and may include unintentional dictation errors.    Merlyn Lot, MD 09/07/21 2308

## 2021-09-08 LAB — TROPONIN I (HIGH SENSITIVITY): Troponin I (High Sensitivity): 9 ng/L (ref <18)

## 2021-09-08 NOTE — ED Notes (Signed)
Patient verbalized discharge understanding  

## 2021-09-11 NOTE — Telephone Encounter (Signed)
Will wait for Dr. Raliegh Ip to review. Recent ER labs showed mild anemia, otherwise not concerning. Has he scheduled a ER follow up with Dr. Raliegh Ip?

## 2021-09-11 NOTE — Telephone Encounter (Unsigned)
Copied from [facility] [id number]. Topic: General - Other >> Sep 07, 2021  3:11 PM Ja-Kwan M wrote: Reason for CRM: Pt spouse [name] requests that Dr. Raliegh Ip place an order for labs. [name] asked that this be done asap because there were some concerns with the pt previous labs.

## 2021-09-12 NOTE — Telephone Encounter (Signed)
Labs are reassuring. I do not see any concerns on the lab. Agree only mild anemia was seen but it is minimal.  What specific concerns does she have? As Rollene Fare stated when covering my inbox, she said that we can order labs and if he wanted to schedule hospital follow-up that is fine.  Let me know to confirm before I order  [name], [facility] 09/12/2021, 3:48 PM

## 2021-09-20 ENCOUNTER — Encounter: Payer: Medicare HMO | Attending: Cardiology | Admitting: *Deleted

## 2021-09-20 ENCOUNTER — Encounter: Payer: Self-pay | Admitting: *Deleted

## 2021-09-20 DIAGNOSIS — I5022 Chronic systolic (congestive) heart failure: Secondary | ICD-10-CM

## 2021-09-20 NOTE — Progress Notes (Signed)
Virtual orientation call completed today. he has an appointment on Date: 10/09/2021  for EP eval and gym Orientation.  Documentation of diagnosis can be found in [facility] Date: 08/09/2021 .  [place] is a current tobacco user. Intervention for tobacco cessation was provided at the initial medical review. He was asked about readiness to quit and reported he would like to quit the snuff he is currently using. He did attempt in the past, the medicine he took caused side effects. . Patient was advised and educated about tobacco cessation using combination therapy, tobacco cessation classes, quit line, and quit smoking apps. Patient demonstrated understanding of this material. Staff will continue to provide encouragement and follow up with the patient throughout the program.

## 2021-09-20 NOTE — Telephone Encounter (Signed)
Pt called in and stated he wanted to have labs done for diabetes.  Pt agreed to schedule an appointment with Dr. Raliegh Ip for 09/28/2021 at 10:20.  Please advise.

## 2021-09-20 NOTE — Telephone Encounter (Signed)
Yes A1c at next apt  [name], [facility] 09/20/2021, 2:01 PM

## 2021-09-27 DIAGNOSIS — F251 Schizoaffective disorder, depressive type: Secondary | ICD-10-CM | POA: Diagnosis not present

## 2021-09-27 DIAGNOSIS — R69 Illness, unspecified: Secondary | ICD-10-CM | POA: Diagnosis not present

## 2021-09-28 ENCOUNTER — Inpatient Hospital Stay: Payer: Medicare HMO | Admitting: Family Medicine

## 2021-09-28 ENCOUNTER — Ambulatory Visit (INDEPENDENT_AMBULATORY_CARE_PROVIDER_SITE_OTHER): Payer: Medicare HMO

## 2021-09-28 VITALS — BP 101/67 | HR 81 | Temp 99.1°F | Ht 70.5 in | Wt 246.6 lb

## 2021-09-28 DIAGNOSIS — Z0000 Encounter for general adult medical examination without abnormal findings: Secondary | ICD-10-CM

## 2021-09-28 DIAGNOSIS — Z1211 Encounter for screening for malignant neoplasm of colon: Secondary | ICD-10-CM

## 2021-09-28 NOTE — Patient Instructions (Signed)
Health Maintenance, Male Adopting a healthy lifestyle and getting preventive care are important in promoting health and wellness. Ask your health care provider about: The right schedule for you to have regular tests and exams. Things you can do on your own to prevent diseases and keep yourself healthy. What should I know about diet, weight, and exercise? Eat a healthy diet  Eat a diet that includes plenty of vegetables, fruits, low-fat dairy products, and lean protein. Do not eat a lot of foods that are high in solid fats, added sugars, or sodium. Maintain a healthy weight Body mass index (BMI) is a measurement that can be used to identify possible weight problems. It estimates body fat based on height and weight. Your health care provider can help determine your BMI and help you achieve or maintain a healthy weight. Get regular exercise Get regular exercise. This is one of the most important things you can do for your health. Most adults should: Exercise for at least 150 minutes each week. The exercise should increase your heart rate and make you sweat (moderate-intensity exercise). Do strengthening exercises at least twice a week. This is in addition to the moderate-intensity exercise. Spend less time sitting. Even light physical activity can be beneficial. Watch cholesterol and blood lipids Have your blood tested for lipids and cholesterol at 52 years of age, then have this test every 5 years. You may need to have your cholesterol levels checked more often if: Your lipid or cholesterol levels are high. You are older than 52 years of age. You are at high risk for heart disease. What should I know about cancer screening? Many types of cancers can be detected early and may often be prevented. Depending on your health history and family history, you may need to have cancer screening at various ages. This may include screening for: Colorectal cancer. Prostate cancer. Skin cancer. Lung  cancer. What should I know about heart disease, diabetes, and high blood pressure? Blood pressure and heart disease High blood pressure causes heart disease and increases the risk of stroke. This is more likely to develop in people who have high blood pressure readings or are overweight. Talk with your health care provider about your target blood pressure readings. Have your blood pressure checked: Every 3-5 years if you are 18-39 years of age. Every year if you are 40 years old or older. If you are between the ages of 65 and 75 and are a current or former smoker, ask your health care provider if you should have a one-time screening for abdominal aortic aneurysm (AAA). Diabetes Have regular diabetes screenings. This checks your fasting blood sugar level. Have the screening done: Once every three years after age 45 if you are at a normal weight and have a low risk for diabetes. More often and at a younger age if you are overweight or have a high risk for diabetes. What should I know about preventing infection? Hepatitis B If you have a higher risk for hepatitis B, you should be screened for this virus. Talk with your health care provider to find out if you are at risk for hepatitis B infection. Hepatitis C Blood testing is recommended for: Everyone born from 1945 through 1965. Anyone with known risk factors for hepatitis C. Sexually transmitted infections (STIs) You should be screened each year for STIs, including gonorrhea and chlamydia, if: You are sexually active and are younger than 52 years of age. You are older than 52 years of age and your   health care provider tells you that you are at risk for this type of infection. Your sexual activity has changed since you were last screened, and you are at increased risk for chlamydia or gonorrhea. Ask your health care provider if you are at risk. Ask your health care provider about whether you are at high risk for HIV. Your health care provider  may recommend a prescription medicine to help prevent HIV infection. If you choose to take medicine to prevent HIV, you should first get tested for HIV. You should then be tested every 3 months for as long as you are taking the medicine. Follow these instructions at home: Alcohol use Do not drink alcohol if your health care provider tells you not to drink. If you drink alcohol: Limit how much you have to 0-2 drinks a day. Know how much alcohol is in your drink. In the [place] one drink equals one 12 oz bottle of beer (355 mL), one 5 oz glass of wine (148 mL), or one 1 oz glass of hard liquor (44 mL). Lifestyle Do not use any products that contain nicotine or tobacco. These products include cigarettes, chewing tobacco, and vaping devices, such as e-cigarettes. If you need help quitting, ask your health care provider. Do not use street drugs. Do not share needles. Ask your health care provider for help if you need support or information about quitting drugs. General instructions Schedule regular health, dental, and eye exams. Stay current with your vaccines. Tell your health care provider if: You often feel depressed. You have ever been abused or do not feel safe at home. Summary Adopting a healthy lifestyle and getting preventive care are important in promoting health and wellness. Follow your health care provider's instructions about healthy diet, exercising, and getting tested or screened for diseases. Follow your health care provider's instructions on monitoring your cholesterol and blood pressure. This information is not intended to replace advice given to you by your health care provider. Make sure you discuss any questions you have with your health care provider. Document Revised: 08/01/2020 Document Reviewed: 08/01/2020 Elsevier Patient Education  2023 [employer]  

## 2021-09-28 NOTE — Progress Notes (Signed)
Subjective:   Randy [name] is a 51 y.o. male who presents for Medicare Annual/Subsequent preventive examination.  Review of Systems    Per HPI unless specifically indicated below       Objective:    Today's Vitals   09/28/21 1359  BP: 101/67  Pulse: 81  Temp: 99.1 F (37.3 C)  TempSrc: Oral  SpO2: 96%  Weight: 246 lb 9.6 oz (111.9 kg)  Height: 5' 10.5" (1.791 m)  PainSc: 0-No pain   Body mass index is 34.88 kg/m.     09/20/2021    2:15 PM 09/07/2021    8:34 PM 07/21/2021    5:53 PM 05/30/2021    8:04 PM 05/01/2021    8:59 PM 10/13/2020   10:36 PM 09/27/2020    2:42 PM  Advanced Directives  Does Patient Have a Medical Advance Directive? No No No No No No No  Would patient like information on creating a medical advance directive? Yes (MAU/Ambulatory/Procedural Areas - Information given)  No - Patient declined No - Patient declined       Current Medications (verified) Outpatient Encounter Medications as of 09/28/2021  Medication Sig   albuterol (PROAIR HFA) 108 (90 Base) MCG/ACT inhaler Inhale 1-2 puffs into the lungs every 6 (six) hours as needed for wheezing or shortness of breath.   ARIPiprazole (ABILIFY) 10 MG tablet Take 2 tablets (20 mg total) by mouth daily.   atorvastatin (LIPITOR) 40 MG tablet Take 1 tablet (40 mg total) by mouth daily.   cetirizine (ZYRTEC) 10 MG tablet Take 10 mg by mouth daily as needed for allergies.   divalproex (DEPAKOTE) 500 MG DR tablet Take 2 tablets nightly   FLUoxetine (PROZAC) 40 MG capsule Take 40 mg by mouth daily.   furosemide (LASIX) 40 MG tablet Take 1 tablet by mouth daily.   losartan (COZAAR) 25 MG tablet Take 0.5 tablets (12.5 mg total) by mouth daily.   metFORMIN (GLUCOPHAGE-XR) 500 MG 24 hr tablet Take 1 tablet (500 mg total) by mouth 2 (two) times daily with a meal.   metoprolol succinate (TOPROL-XL) 25 MG 24 hr tablet Take 0.5 tablets (12.5 mg total) by mouth daily.   mirtazapine (REMERON) 15 MG tablet TAKE 1/2 TABLET  (7.'5mg'$ ) BY MOUTH each night BEFORE bed.   Paliperidone Palmitate (INVEGA TRINZA IM) Inject 1 each into the muscle every 30 (thirty) days.   doxepin (SINEQUAN) 10 MG capsule Take 2 capsules (20 mg total) by mouth at bedtime.   FLUoxetine (PROZAC) 10 MG capsule Take 1 capsule (10 mg total) by mouth daily.   [DISCONTINUED] furosemide (LASIX) 40 MG tablet Take 1 tablet (40 mg total) by mouth daily.   No facility-administered encounter medications on file as of 09/28/2021.    Allergies (verified) Patient has no known allergies.   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   Past Surgical History:  Procedure Laterality Date   RIGHT/LEFT HEART CATH AND CORONARY ANGIOGRAPHY N/A 05/31/2021   Procedure: RIGHT/LEFT HEART CATH AND CORONARY ANGIOGRAPHY;  Surgeon: Nelva Bush, MD;  Location: [facility];  Service: Cardiovascular;  Laterality: N/A;   Family History  Problem Relation Age of Onset   Alcohol abuse Mother  Cancer Father        colon    Colon cancer Father    Social History   Socioeconomic History   Marital status: Married    Spouse name: Angas Isabell   Number of children: 3   Years of education: Not on file   Highest education level: 11th grade  Occupational History   Occupation: disability   Tobacco Use   Smoking status: Former    Packs/day: 1.50    Years: 5.00    Total pack years: 7.50    Types: Cigarettes    Quit date: 03/26/2001    Years since quitting: 20.5   Smokeless tobacco: Current    Types: Chew  Vaping Use   Vaping Use: Former  Substance and Sexual Activity   Alcohol use: No   Drug use: Never   Sexual activity: Yes  Other Topics Concern   Not on file  Social History Narrative    Ongoing financial constraints mentioned during phone call today   Social Determinants of Health   Financial Resource Strain: Medium Risk (09/28/2021)   Overall Financial Resource Strain (CARDIA)    Difficulty of Paying Living Expenses: Somewhat hard  Food Insecurity: No Food Insecurity (09/28/2021)   Hunger Vital Sign    Worried About Running Out of Food in the Last Year: Never true    Ran Out of Food in the Last Year: Never true  Transportation Needs: No Transportation Needs (09/28/2021)   PRAPARE - Hydrologist (Medical): No    Lack of Transportation (Non-Medical): No  Physical Activity: Insufficiently Active (09/27/2020)   Exercise Vital Sign    Days of Exercise per Week: 3 days    Minutes of Exercise per Session: 40 min  Stress: No Stress Concern Present (09/27/2020)   [place]    Feeling of Stress : Not at all  Social Connections: Moderately Isolated (09/28/2021)   Social Connection and Isolation Panel [NHANES]    Frequency of Communication with Friends and Family: More than three times a week    Frequency of Social Gatherings with Friends and Family: More than three times a week    Attends Religious Services: Never    Marine scientist or Organizations: No    Attends Music therapist: Never    Marital Status: Married    Tobacco Counseling Ready to quit: Not Answered Counseling given: Not Answered   Clinical Intake:  Pre-visit preparation completed: No  Pain : No/denies pain Pain Score: 0-No pain     Nutritional Risks: Nausea/ vomitting/ diarrhea Diabetes: Yes CBG done?: No Did pt. bring in CBG monitor from home?: No  How often do you need to have someone help you when you read instructions, pamphlets, or other written materials from your doctor or pharmacy?: 5 - Always  Diabetic?Nutrition Risk Assessment:  Has the patient had any N/V/D within the last 2  months?  Yes  Does the patient have any non-healing wounds?  No  Has the patient had any unintentional weight loss or weight gain?  No   Diabetes:  Is the patient diabetic?  Yes  If diabetic, was a CBG obtained today?  No  Did the patient bring in their glucometer from home?  No  How often do you monitor your CBG's? Never .   Financial Strains and Diabetes Management:  Are you having any financial strains with the device, your supplies or your medication? No .  Does the patient want to be seen by Chronic Care Management for management of their diabetes?  No  Would the patient like to be referred to a Nutritionist or for Diabetic Management?  No   Diabetic Exams:  Diabetic Eye Exam: Overdue for diabetic eye exam. Pt has been advised about the importance in completing this exam. Patient advised to call and schedule an eye exam. Diabetic Foot Exam: Overdue, Pt has been advised about the importance in completing this exam. Pt is scheduled for diabetic foot exam on  .   Interpreter Needed?: No  Information entered by :: Donnie Mesa, [place]   Activities of Daily Living    09/28/2021    2:08 PM 05/30/2021    8:10 PM  In your present state of health, do you have any difficulty performing the following activities:  Hearing? 0   Vision? 0   Difficulty concentrating or making decisions? 1   Walking or climbing stairs? 1   Dressing or bathing? 0   Doing errands, shopping? 0 0    Patient Care Team: Olin Hauser, DO as PCP - General (Family Medicine) End, Harrell Gave, MD as PCP - Cardiology (Cardiology) Greg Cutter, LCSW as Social Worker (Licensed Clinical Social Worker) End, Harrell Gave, MD as Consulting Physician (Cardiology) Rutherford Nail, [place] any recent [place] you may have received from other than [facility] providers in the past year (date may be approximate). Pt seen [facility]07/2021 for heart palpitations and atypical chest  pain.     Assessment:   This is a routine wellness examination for Jayshawn.  Hearing/Vision screen No results found.  Dietary issues and exercise activities discussed: Current Exercise Habits: Home exercise routine, Type of exercise: walking, Time (Minutes): 60, Frequency (Times/Week): 2, Weekly Exercise (Minutes/Week): 120, Intensity: Intense, Exercise limited by: cardiac condition(s);respiratory conditions(s)   Goals Addressed   None    Depression Screen    09/28/2021    2:04 PM 06/09/2021    9:35 AM 11/09/2020    6:52 AM 09/27/2020    2:43 PM 05/06/2020    2:46 PM 11/04/2019    2:30 PM 10/14/2018    2:52 PM  PHQ 2/9 Scores  PHQ - 2 Score 2 0 1 0 2 2 0  PHQ- 9 Score '11 7 3  7 7     '$ Fall Risk    09/28/2021    2:08 PM 09/20/2021    2:14 PM 06/09/2021    9:35 AM 09/27/2020    2:43 PM 10/14/2018    2:52 PM  Fall Risk   Falls in the past year? 0 0 0 0 1  Number falls in past yr: 0  0    Injury with Fall? 0  0  1  Risk for fall due to : No Fall Risks Medication side effect No Fall Risks Medication side effect   Follow up Falls evaluation completed Education provided;Falls prevention discussed Falls evaluation completed Falls evaluation completed;Education provided;Falls prevention discussed Falls evaluation completed    FALL RISK PREVENTION PERTAINING TO THE HOME:  Any stairs in or around the home? No  If so, are there any without handrails? No  Home free of loose throw rugs in walkways, pet beds, electrical cords, etc? Yes  Adequate lighting in your home to reduce risk of falls? Yes   ASSISTIVE DEVICES UTILIZED TO PREVENT FALLS:  Life alert? No  Use of a  cane, walker or w/c? No  Grab bars in the bathroom? No  Shower chair or bench in shower? No  Elevated toilet seat or a handicapped toilet? No   TIMED UP AND GO:  Was the test performed? Yes .  Length of time to ambulate 10 feet: 10  sec.   Gait steady and fast without use of assistive device  Cognitive Function:         09/28/2021    2:09 PM 09/27/2020    2:44 PM 05/20/2018    1:49 PM  6CIT Screen  What Year? 0 points 0 points 0 points  What month? 0 points 0 points 0 points  What time? 0 points 0 points 0 points  Count back from 20 0 points 0 points 0 points  Months in reverse 0 points 0 points 0 points  Repeat phrase 2 points 6 points 2 points  Total Score 2 points 6 points 2 points    Immunizations Immunization History  Administered Date(s) Administered   Influenza Inj Mdck Quad With Preservative 01/13/2018   Influenza,inj,Quad PF,6+ Mos 12/05/2018, 11/29/2019   Influenza-Unspecified 12/17/2016, 01/07/2018   PFIZER(Purple Top)SARS-COV-2 Vaccination 06/16/2019, 07/14/2019   Pneumococcal Polysaccharide-23 04/10/2017    TDAP status: Due, Education has been provided regarding the importance of this vaccine. Advised may receive this vaccine at local pharmacy or Health Dept. Aware to provide a copy of the vaccination record if obtained from local pharmacy or Health Dept. Verbalized acceptance and understanding.  Flu Vaccine status: Up to date  Pneumococcal vaccine status: Up to date  Covid-19 vaccine status: Completed vaccines  Qualifies for Shingles Vaccine? Yes   Zostavax completed No   Shingrix Completed?: No.    Education has been provided regarding the importance of this vaccine. Patient has been advised to call insurance company to determine out of pocket expense if they have not yet received this vaccine. Advised may also receive vaccine at local pharmacy or Health Dept. Verbalized acceptance and understanding.  Screening Tests Health Maintenance  Topic Date Due   OPHTHALMOLOGY EXAM  Never done   Hepatitis C Screening  Never done   Zoster Vaccines- Shingrix (1 of 2) Never done   COLONOSCOPY (Pts 45-67yr Insurance coverage will need to be confirmed)  04/12/2018   COVID-19 Vaccine (3 - Pfizer risk series) 08/11/2019   FOOT EXAM  12/27/2020   TETANUS/TDAP  11/08/2021 (Originally  09/27/1989)   INFLUENZA VACCINE  10/24/2021   HEMOGLOBIN A1C  11/30/2021   HIV Screening  Completed   HPV VACCINES  Aged Out    Health Maintenance  Health Maintenance Due  Topic Date Due   OPHTHALMOLOGY EXAM  Never done   Hepatitis C Screening  Never done   Zoster Vaccines- Shingrix (1 of 2) Never done   COLONOSCOPY (Pts 45-416yrInsurance coverage will need to be confirmed)  04/12/2018   COVID-19 Vaccine (3 - Pfizer risk series) 08/11/2019   FOOT EXAM  12/27/2020    Colorectal cancer screening: Type of screening: Colonoscopy. Completed 04/12/2017. Repeat every 1 years  Lung Cancer Screening: (Low Dose CT Chest recommended if Age 52-80ears, 30 pack-year currently smoking OR have quit w/in 15years.) does not qualify.   Lung Cancer Screening Referral: does not qualify   Additional Screening:  Hepatitis C Screening: does qualify;   Vision Screening: Recommended annual ophthalmology exams for early detection of glaucoma and other disorders of the eye. Is the patient up to date with their annual eye exam?  No  Who is the  provider or what is the name of the office in which the patient attends annual eye exams?  If pt is not established with a provider, would they like to be referred to a provider to establish care? No .   Dental Screening: Recommended annual dental exams for proper oral hygiene  Community Resource Referral / Chronic Care Management: CRR required this visit?  No   CCM required this visit?  No      Plan:     I have personally reviewed and noted the following in the patient's chart:   Medical and social history Use of alcohol, tobacco or illicit drugs  Current medications and supplements including opioid prescriptions. Patient is not currently taking opioid prescriptions. Functional ability and status Nutritional status Physical activity Advanced directives List of other physicians Hospitalizations, surgeries, and ER visits in previous 12  months Vitals Screenings to include cognitive, depression, and falls Referrals and appointments  In addition, I have reviewed and discussed with patient certain preventive protocols, quality metrics, and best practice recommendations. A written personalized care plan for preventive services as well as general preventive health recommendations were provided to patient.    Mr. Hetland , Thank you for taking time to come for your Medicare Wellness Visit. I appreciate your ongoing commitment to your health goals. Please review the following plan we discussed and let me know if I can assist you in the future.   These are the goals we discussed:  Goals       "I want to take better care of my self" (pt-stated)      Current Barriers:  Financial constraints Limited social support Mental Health Concerns  Limited education about self care management * Cognitive Deficits  Clinical Social Work Clinical Goal(s):  Over the next 90 days, client will work with SW to address concerns related to mental health  Over the next 90 days, patient will work with LCSW to address needs related to lack of self care Over the next 90 days, client will follow up with RNCM in regards to diabetes education* as directed by SW  Interventions: Patient interviewed and appropriate assessments performed Provided mental health counseling with regard to implementing appropriate self care methods to combat symptoms of depression/anxiety/stress/boredom Discussed plans with patient for ongoing care management follow up and provided patient with direct contact information for care management team Collaborated with RN Case Manager re: diabetes education Assisted patient/caregiver with obtaining information about health plan benefits  Patient Self Care Activities:  Attends all scheduled provider appointments Calls provider office for new concerns or questions  Initial goal documentation       Patient Stated      09/27/2020, no  goals        This is a list of the screening recommended for you and due dates:  Health Maintenance  Topic Date Due   Eye exam for diabetics  Never done   Hepatitis C Screening: USPSTF Recommendation to screen - Ages 16-79 yo.  Never done   Zoster (Shingles) Vaccine (1 of 2) Never done   Colon Cancer Screening  04/12/2018   COVID-19 Vaccine (3 - Pfizer risk series) 08/11/2019   Complete foot exam   12/27/2020   Tetanus Vaccine  11/08/2021*   Flu Shot  10/24/2021   Hemoglobin A1C  11/30/2021   HIV Screening  Completed   HPV Vaccine  Aged Out  *Topic was postponed. The date shown is not the original due date.     Wilson Singer,  [place]   09/28/2021   Nurse Note:A referral was placed for a colonoscopy follow-up at [facility]. The pt last colonoscopy was 04/12/2017. He was told he needed to repeat in 1 year for surveillance based on the number of polyps. He was scheduled to follow up last year for the procedure, but he state it was canceled due to [place].

## 2021-09-29 DIAGNOSIS — Z1211 Encounter for screening for malignant neoplasm of colon: Principal | ICD-10-CM

## 2021-10-09 ENCOUNTER — Ambulatory Visit: Payer: Medicare HMO

## 2021-10-10 ENCOUNTER — Ambulatory Visit: Payer: Medicare HMO

## 2021-10-16 ENCOUNTER — Emergency Department: Payer: Medicare HMO

## 2021-10-16 ENCOUNTER — Other Ambulatory Visit: Payer: Self-pay

## 2021-10-16 ENCOUNTER — Encounter: Payer: Self-pay | Admitting: *Deleted

## 2021-10-16 DIAGNOSIS — R109 Unspecified abdominal pain: Secondary | ICD-10-CM | POA: Insufficient documentation

## 2021-10-16 DIAGNOSIS — I2510 Atherosclerotic heart disease of native coronary artery without angina pectoris: Secondary | ICD-10-CM | POA: Insufficient documentation

## 2021-10-16 DIAGNOSIS — K760 Fatty (change of) liver, not elsewhere classified: Secondary | ICD-10-CM | POA: Diagnosis not present

## 2021-10-16 DIAGNOSIS — E119 Type 2 diabetes mellitus without complications: Secondary | ICD-10-CM | POA: Insufficient documentation

## 2021-10-16 DIAGNOSIS — I509 Heart failure, unspecified: Secondary | ICD-10-CM | POA: Insufficient documentation

## 2021-10-16 DIAGNOSIS — R0602 Shortness of breath: Secondary | ICD-10-CM | POA: Diagnosis not present

## 2021-10-16 DIAGNOSIS — I110 Hypertensive heart disease with heart failure: Secondary | ICD-10-CM | POA: Diagnosis not present

## 2021-10-16 LAB — CBC
HCT: 38.5 % — ABNORMAL LOW (ref 39.0–52.0)
Hemoglobin: 12.7 g/dL — ABNORMAL LOW (ref 13.0–17.0)
MCH: 28.9 pg (ref 26.0–34.0)
MCHC: 33 g/dL (ref 30.0–36.0)
MCV: 87.5 fL (ref 80.0–100.0)
Platelets: 185 10*3/uL (ref 150–400)
RBC: 4.4 MIL/uL (ref 4.22–5.81)
RDW: 14.3 % (ref 11.5–15.5)
WBC: 5.7 10*3/uL (ref 4.0–10.5)
nRBC: 0 % (ref 0.0–0.2)

## 2021-10-16 LAB — BASIC METABOLIC PANEL
Anion gap: 11 (ref 5–15)
BUN: 19 mg/dL (ref 6–20)
CO2: 28 mmol/L (ref 22–32)
Calcium: 9.4 mg/dL (ref 8.9–10.3)
Chloride: 104 mmol/L (ref 98–111)
Creatinine, Ser: 1.05 mg/dL (ref 0.61–1.24)
GFR, Estimated: >60 mL/min (ref >60)
Glucose, Bld: 177 mg/dL — ABNORMAL HIGH (ref 70–99)
Potassium: 3.6 mmol/L (ref 3.5–5.1)
Sodium: 143 mmol/L (ref 135–145)

## 2021-10-16 LAB — TROPONIN I (HIGH SENSITIVITY): Troponin I (High Sensitivity): 10 ng/L (ref <18)

## 2021-10-16 NOTE — ED Triage Notes (Signed)
Pt reports sob, swelling to legs and testicles.  Sx for 1 week.  States worse today.  No chest pain.  No n/v/d.  Pt alert.

## 2021-10-17 ENCOUNTER — Emergency Department: Payer: Medicare HMO

## 2021-10-17 ENCOUNTER — Telehealth: Payer: Self-pay | Admitting: Family

## 2021-10-17 ENCOUNTER — Emergency Department
Admission: EM | Admit: 2021-10-17 | Discharge: 2021-10-17 | Disposition: A | Discharge status: Home / Self Care | Payer: Medicare HMO | Attending: Emergency Medicine | Admitting: Emergency Medicine

## 2021-10-17 DIAGNOSIS — K760 Fatty (change of) liver, not elsewhere classified: Secondary | ICD-10-CM | POA: Diagnosis not present

## 2021-10-17 DIAGNOSIS — I509 Heart failure, unspecified: Secondary | ICD-10-CM

## 2021-10-17 LAB — BRAIN NATRIURETIC PEPTIDE: B Natriuretic Peptide: 20.7 pg/mL (ref 0.0–100.0)

## 2021-10-17 LAB — HEPATIC FUNCTION PANEL
ALT: 29 U/L (ref 0–44)
AST: 26 U/L (ref 15–41)
Albumin: 4.2 g/dL (ref 3.5–5.0)
Alkaline Phosphatase: 40 U/L (ref 38–126)
Bilirubin, Direct: <0.1 mg/dL (ref 0.0–0.2)
Total Bilirubin: 0.5 mg/dL (ref 0.3–1.2)
Total Protein: 7.7 g/dL (ref 6.5–8.1)

## 2021-10-17 LAB — TROPONIN I (HIGH SENSITIVITY): Troponin I (High Sensitivity): 9 ng/L (ref <18)

## 2021-10-17 LAB — LIPASE, BLOOD: Lipase: 46 U/L (ref 11–51)

## 2021-10-17 NOTE — ED Notes (Signed)
Patient complains of shortness of breath , airway is clear , respirations are even and non labored. No S/S of respiratory distress noted. Patient placed on monitor. Saturating at 100% room air .

## 2021-10-17 NOTE — Discharge Instructions (Addendum)
Your work-up here was very reassuring I do not see any evidence of your heart failure exacerbation.  However I do want you to follow-up with the heart failure team due to your recent history of low EF.  Continue taking your Lasix and they can help you trend her weights to decide when you need more Lasix.  If you develop pain or swelling in 1 leg please return to the ER for evaluation for blood clot but that seems less likely at this time or if you develop worsening shortness of breath, chest pain or any other concern

## 2021-10-17 NOTE — ED Notes (Signed)
Ambulatory sats at 98-99. Denies and SOB or dizziness.

## 2021-10-17 NOTE — Telephone Encounter (Signed)
Called and LVM with patient in attempt to schedule him a new patient CHF Clinic appointment but was unable to Reach.   Ikesha Siller, NT

## 2021-10-17 NOTE — ED Provider Notes (Signed)
[facility] Provider Note    Event Date/Time   First MD Initiated Contact with Patient 10/17/21 0710     (approximate)   History   Shortness of Breath and Leg Swelling   HPI  Randy [name] with history of nonischemic cardiomyopathy, schizophrenia, diabetes, emphysema coronary disease is a 51 y.o. male who comes in with shortness of breath and swelling to his legs and testicle.  Patient reports that since March when he was first admitted he has had chronic shortness of breath.  He denies it being any worse than normal.  He reports that with new today's that he has noticed some distention in his abdomen and suprapubically.  There is no swelling in his testicles.  He is got no significant leg swelling.  He has been compliant with his Lasix taking 20 mg in the morning in the afternoon.  He denies any falls, hitting his head.   Physical Exam   Triage Vital Signs: ED Triage Vitals  Enc Vitals Group     BP 10/16/21 2226 129/73     Pulse Rate 10/16/21 2226 81     Resp 10/16/21 2226 20     Temp 10/16/21 2226 98.5 F (36.9 C)     Temp Source 10/16/21 2226 Oral     SpO2 10/16/21 2226 96 %     Weight 10/16/21 2223 244 lb 11.4 oz (111 kg)     Height 10/16/21 2223 '5\' 11"'$  (1.803 m)     Head Circumference --      Peak Flow --      Pain Score 10/16/21 2222 9     Pain Loc --      Pain Edu? --      Excl. in [place]? --     Most recent vital signs: Vitals:   10/17/21 0218 10/17/21 0654  BP: 122/64 121/77  Pulse: 73 (!) 58  Resp: 16 17  Temp: 98.2 F (36.8 C) 98.1 F (36.7 C)  SpO2: 98% 99%     General: Awake, no distress.  CV:  Good peripheral perfusion.  Resp:  Normal effort.  Abd:  No distention.  Some reported tenderness with palpation but no rebound or guarding Other:  No swelling noted in the legs.  Legs are equal in size No significant swelling noted to his testicles.  Nontender. .   no calf tenderness  ED Results / Procedures / Treatments    Labs (all labs ordered are listed, but only abnormal results are displayed) Labs Reviewed  BASIC METABOLIC PANEL - Abnormal; Notable for the following components:      Result Value   Glucose, Bld 177 (*)    All other components within normal limits  CBC - Abnormal; Notable for the following components:   Hemoglobin 12.7 (*)    HCT 38.5 (*)    All other components within normal limits  BRAIN NATRIURETIC PEPTIDE  TROPONIN I (HIGH SENSITIVITY)  TROPONIN I (HIGH SENSITIVITY)     EKG  My interpretation of EKG:  Normal sinus rate of 83 without any ST elevation, T wave version in lead II with a left bundle branch block.  Prior EKG is similar  RADIOLOGY I have reviewed the xray personally and and interpreted no evidence of any pneumonia  IMPRESSION: 1. No acute abnormality identified in the abdomen or pelvis. 2. Hepatic steatosis.   PROCEDURES:  Critical Care performed: No  Procedures   MEDICATIONS ORDERED IN ED: Medications - No data to display  IMPRESSION / MDM / ASSESSMENT AND PLAN / ED COURSE  I reviewed the triage vital signs and the nursing notes.   Patient's presentation is most consistent with acute presentation with potential threat to life or bodily function.   I reviewed patient's hospital admission in March.  Where he was admitted on 3/6.  He had a negative CTA at that time, EF was 35% and had a catheterization without coronary disease requiring stenting.  He reports having chronic chest pain and shortness of breath since then has not been changed at all however what was new with some increasing swelling he noted in his abdomen and suprapubically.  On my examination I do not see any evidence of severe fluid overload.  Labs to be ordered evaluate for any electrolyte abnormalities.  Will get CT scan to make sure no evidence of any significant ascites or acute issues given he does report some abdominal tenderness.  Given the chest pain shortness of breath is  stable from when he was first admitted back in March need a CT scan at that time I very low suspicion for PE.  There is no evidence of DVT on my examination.   Bmp normal  Cbc stable hemoglobin  Trop negative x2  Bnp normal   Chest xray negative   Ct abd negative-did discuss the dental finding of hepatic steatosis  Repeat evaluation patient satting 100% on room air sitting in bed looks very comfortable.  Updated him on CT imaging.  We discussed not going up too much more on the Lasix at this time due to the risk for his kidneys given he looks pretty euvolemic on exam but I will give we will give him a referral to the heart failure clinic speak and help keep track of his weights and help decide with and when he would need more Lasix.  I considered admission but given reassuring work-up including negative troponins x2 and no evidence of significant fluid overload patient is safe for discharge home      FINAL CLINICAL IMPRESSION(S) / ED DIAGNOSES   Final diagnoses:  Congestive heart failure, unspecified HF chronicity, unspecified heart failure type [facility])     Rx / DC Orders   ED Discharge Orders          Ordered    AMB referral to CHF clinic        10/17/21 0858             Note:  This document was prepared using Dragon voice recognition software and may include unintentional dictation errors.   Vanessa Rowlett, MD 10/17/21 0900

## 2021-10-18 ENCOUNTER — Telehealth: Payer: Self-pay | Admitting: Family

## 2021-10-18 NOTE — Telephone Encounter (Signed)
Spoke to patients spouse about attempting to make a patient appointment after receiving a referral and pt spouse stated that patient is unaware and needs her at all appointments as well as needing patient assistance for everything as he can not afford the medications they want to put him on ( Jardiance, entresto etc) but patient wants to stick with [facility]. Spouse amy states she will reach out to his [facility] Cardiology team and find out if they need to move forward with an appointment and call me back.

## 2021-10-30 ENCOUNTER — Ambulatory Visit: Payer: Medicare HMO | Admitting: Family Medicine

## 2021-11-01 DIAGNOSIS — R69 Illness, unspecified: Secondary | ICD-10-CM | POA: Diagnosis not present

## 2021-11-01 DIAGNOSIS — F251 Schizoaffective disorder, depressive type: Secondary | ICD-10-CM | POA: Diagnosis not present

## 2021-11-16 ENCOUNTER — Ambulatory Visit: Admit: 2021-11-16 | Discharge: 2021-11-17 | Payer: MEDICARE | Attending: Adult Health | Primary: Adult Health

## 2021-11-16 DIAGNOSIS — I5022 Chronic systolic (congestive) heart failure: Secondary | ICD-10-CM | POA: Diagnosis not present

## 2021-11-16 DIAGNOSIS — I2510 Atherosclerotic heart disease of native coronary artery without angina pectoris: Secondary | ICD-10-CM | POA: Diagnosis not present

## 2021-11-16 MED ORDER — ENTRESTO 24 MG-26 MG TABLET
ORAL_TABLET | Freq: Two times a day (BID) | ORAL | 11 refills | 30 days | Status: CP
Start: 2021-11-16 — End: ?
  Filled 2021-11-30: qty 60, 30d supply, fill #0

## 2021-11-16 MED ORDER — EMPAGLIFLOZIN 10 MG TABLET
ORAL_TABLET | Freq: Every day | ORAL | 11 refills | 60 days | Status: CP
Start: 2021-11-16 — End: ?
  Filled 2021-11-30: qty 60, 60d supply, fill #0

## 2021-11-29 DIAGNOSIS — F250 Schizoaffective disorder, bipolar type: Secondary | ICD-10-CM | POA: Diagnosis not present

## 2021-11-29 DIAGNOSIS — R69 Illness, unspecified: Secondary | ICD-10-CM | POA: Diagnosis not present

## 2021-11-29 DIAGNOSIS — Z5181 Encounter for therapeutic drug level monitoring: Secondary | ICD-10-CM | POA: Diagnosis not present

## 2021-12-05 ENCOUNTER — Ambulatory Visit: Admit: 2021-12-05 | Discharge: 2021-12-06 | Payer: MEDICARE

## 2021-12-05 DIAGNOSIS — I2510 Atherosclerotic heart disease of native coronary artery without angina pectoris: Secondary | ICD-10-CM | POA: Diagnosis not present

## 2021-12-05 DIAGNOSIS — I5022 Chronic systolic (congestive) heart failure: Secondary | ICD-10-CM | POA: Diagnosis not present

## 2021-12-11 ENCOUNTER — Encounter: Payer: Self-pay | Admitting: Family Medicine

## 2021-12-18 ENCOUNTER — Ambulatory Visit: Admit: 2021-12-18 | Discharge: 2021-12-19 | Payer: MEDICARE | Attending: Adult Health | Primary: Adult Health

## 2021-12-18 DIAGNOSIS — I2510 Atherosclerotic heart disease of native coronary artery without angina pectoris: Secondary | ICD-10-CM | POA: Diagnosis not present

## 2021-12-18 DIAGNOSIS — I5022 Chronic systolic (congestive) heart failure: Secondary | ICD-10-CM | POA: Diagnosis not present

## 2021-12-18 DIAGNOSIS — G4733 Obstructive sleep apnea (adult) (pediatric): Secondary | ICD-10-CM | POA: Diagnosis not present

## 2021-12-18 MED ORDER — ENTRESTO 49 MG-51 MG TABLET
ORAL_TABLET | Freq: Two times a day (BID) | ORAL | 11 refills | 30 days | Status: CP
Start: 2021-12-18 — End: ?

## 2021-12-19 DIAGNOSIS — I5022 Chronic systolic (congestive) heart failure: Principal | ICD-10-CM

## 2021-12-19 MED ORDER — ENTRESTO 49 MG-51 MG TABLET
ORAL_TABLET | Freq: Two times a day (BID) | ORAL | 11 refills | 30 days | Status: CP
Start: 2021-12-19 — End: ?
  Filled 2021-12-26: qty 60, 30d supply, fill #0

## 2021-12-21 ENCOUNTER — Ambulatory Visit: Admit: 2021-12-21 | Discharge: 2021-12-22 | Payer: MEDICARE

## 2021-12-21 DIAGNOSIS — I3481 Nonrheumatic mitral (valve) annulus calcification: Secondary | ICD-10-CM | POA: Diagnosis not present

## 2021-12-21 DIAGNOSIS — I5022 Chronic systolic (congestive) heart failure: Secondary | ICD-10-CM | POA: Diagnosis not present

## 2021-12-21 DIAGNOSIS — J984 Other disorders of lung: Secondary | ICD-10-CM | POA: Diagnosis not present

## 2021-12-23 ENCOUNTER — Other Ambulatory Visit: Payer: Self-pay | Admitting: Family Medicine

## 2021-12-23 DIAGNOSIS — E1169 Type 2 diabetes mellitus with other specified complication: Secondary | ICD-10-CM

## 2021-12-25 NOTE — Telephone Encounter (Signed)
Requested Prescriptions  Pending Prescriptions Disp Refills  . atorvastatin (LIPITOR) 40 MG tablet [Pharmacy Med Name: ATORVASTATIN 40 MG TABLET] 90 tablet 0    Sig: TAKE ONE TABLET BY MOUTH DAILY     Cardiovascular:  Antilipid - Statins Failed - 12/23/2021  6:53 AM      Failed - Lipid Panel in normal range within the last 12 months    Cholesterol  Date Value Ref Range Status  05/30/2021 135 0 - 200 mg/dL Final   LDL Cholesterol (Calc)  Date Value Ref Range Status  11/08/2020 52 mg/dL (calc) Final    Comment:    Reference range: <100 . Desirable range <100 mg/dL for primary prevention;   <70 mg/dL for patients with CHD or diabetic patients  with > or = 2 CHD risk factors. [name] LDL-C is now calculated using the Martin-Hopkins  calculation, which is a validated novel method providing  better accuracy than the Friedewald equation in the  estimation of LDL-C.  Cresenciano Genre et al. Annamaria Helling. 0254;270(62): 2061-2068  (http://education.[facility].com/faq/FAQ164)    LDL Cholesterol  Date Value Ref Range Status  05/30/2021 49 0 - 99 mg/dL Final    Comment:           Total Cholesterol/HDL:CHD Risk Coronary Heart Disease Risk Table                     Men   Women  1/2 Average Risk   3.4   3.3  Average Risk       5.0   4.4  2 X Average Risk   9.6   7.1  3 X Average Risk  23.4   11.0        Use the calculated Patient Ratio above and the CHD Risk Table to determine the patient's CHD Risk.        ATP III CLASSIFICATION (LDL):  <100     mg/dL   Optimal  100-129  mg/dL   Near or Above                    Optimal  130-159  mg/dL   Borderline  160-189  mg/dL   High  >190     mg/dL   Very High Performed at [facility], [place], [place] [address]    HDL  Date Value Ref Range Status  05/30/2021 45 >40 mg/dL Final   Triglycerides  Date Value Ref Range Status  05/30/2021 203 (H) <150 mg/dL Final         Passed - Patient is not pregnant      Passed -  Valid encounter within last 12 months    Recent Outpatient Visits          6 months ago Acute HFrEF (heart failure with reduced ejection fraction) ([place])   [place], DO   1 year ago Annual physical exam   [facility] Olin Hauser, DO   1 year ago Type 2 diabetes mellitus with other specified complication, without long-term current use of insulin [facility])   [facility] Olin Hauser, DO   1 year ago Paranoid schizophrenia [facility])   [facility] Olin Hauser, DO   2 years ago Paranoid schizophrenia [facility])   [facility] Olin Hauser, DO      Future Appointments            In 1  week Olin Hauser, DO [facility], [facility]           . losartan (COZAAR) 25 MG tablet [Pharmacy Med Name: LOSARTAN POTASSIUM 25 MG TAB] 45 tablet 0    Sig: TAKE 1/2 TABLET BY MOUTH DAILY     Cardiovascular:  Angiotensin Receptor Blockers Passed - 12/23/2021  6:53 AM      Passed - Cr in normal range and within 180 days    Creat  Date Value Ref Range Status  11/08/2020 0.94 0.70 - 1.30 mg/dL Final   Creatinine, Ser  Date Value Ref Range Status  10/16/2021 1.05 0.61 - 1.24 mg/dL Final         Passed - K in normal range and within 180 days    Potassium  Date Value Ref Range Status  10/16/2021 3.6 3.5 - 5.1 mmol/L Final  04/15/2014 3.6 3.5 - 5.1 mmol/L Final         Passed - Patient is not pregnant      Passed - Last BP in normal range    BP Readings from Last 1 Encounters:  10/17/21 120/71         Passed - Valid encounter within last 6 months    Recent Outpatient Visits          6 months ago Acute HFrEF (heart failure with reduced ejection fraction) ([place])   [place], DO   1 year ago Annual physical exam   [facility] Olin Hauser, DO   1 year ago Type 2  diabetes mellitus with other specified complication, without long-term current use of insulin [facility])   [facility] Olin Hauser, DO   1 year ago Paranoid schizophrenia [facility])   [facility] Olin Hauser, DO   2 years ago Paranoid schizophrenia [facility])   [facility] Parks Ranger, Devonne Doughty, DO      Future Appointments            In 1 week Parks Ranger, Devonne Doughty, DO [facility], [facility]

## 2021-12-26 ENCOUNTER — Ambulatory Visit: Admit: 2021-12-26 | Discharge: 2021-12-27 | Payer: MEDICARE

## 2021-12-26 DIAGNOSIS — I5022 Chronic systolic (congestive) heart failure: Secondary | ICD-10-CM | POA: Diagnosis not present

## 2021-12-26 DIAGNOSIS — R69 Illness, unspecified: Secondary | ICD-10-CM | POA: Diagnosis not present

## 2021-12-26 DIAGNOSIS — F251 Schizoaffective disorder, depressive type: Secondary | ICD-10-CM | POA: Diagnosis not present

## 2021-12-29 ENCOUNTER — Ambulatory Visit: Payer: Medicare HMO | Admitting: Family Medicine

## 2022-01-02 ENCOUNTER — Ambulatory Visit: Payer: Medicare HMO | Admitting: Family Medicine

## 2022-01-14 ENCOUNTER — Ambulatory Visit
Admit: 2022-01-14 | Discharge: 2022-01-15 | Payer: MEDICARE | Attending: Student in an Organized Health Care Education/Training Program | Primary: Student in an Organized Health Care Education/Training Program

## 2022-01-14 DIAGNOSIS — R070 Pain in throat: Secondary | ICD-10-CM | POA: Diagnosis not present

## 2022-01-14 DIAGNOSIS — J22 Unspecified acute lower respiratory infection: Secondary | ICD-10-CM | POA: Diagnosis not present

## 2022-01-14 MED ORDER — AZITHROMYCIN 250 MG TABLET
ORAL_TABLET | 0 refills | 5 days | Status: CP
Start: 2022-01-14 — End: 2022-01-19

## 2022-01-22 DIAGNOSIS — E785 Hyperlipidemia, unspecified: Secondary | ICD-10-CM | POA: Diagnosis not present

## 2022-01-22 DIAGNOSIS — R69 Illness, unspecified: Secondary | ICD-10-CM | POA: Diagnosis not present

## 2022-01-22 DIAGNOSIS — E119 Type 2 diabetes mellitus without complications: Secondary | ICD-10-CM | POA: Diagnosis not present

## 2022-01-22 DIAGNOSIS — G4733 Obstructive sleep apnea (adult) (pediatric): Secondary | ICD-10-CM | POA: Diagnosis not present

## 2022-01-22 DIAGNOSIS — G4700 Insomnia, unspecified: Secondary | ICD-10-CM | POA: Diagnosis not present

## 2022-01-22 DIAGNOSIS — I509 Heart failure, unspecified: Secondary | ICD-10-CM | POA: Diagnosis not present

## 2022-01-22 DIAGNOSIS — N529 Male erectile dysfunction, unspecified: Secondary | ICD-10-CM | POA: Diagnosis not present

## 2022-01-22 DIAGNOSIS — D84822 Immunodeficiency due to external causes: Secondary | ICD-10-CM | POA: Diagnosis not present

## 2022-01-22 DIAGNOSIS — I110 Hypertensive heart disease with heart failure: Secondary | ICD-10-CM | POA: Diagnosis not present

## 2022-01-29 DIAGNOSIS — R69 Illness, unspecified: Secondary | ICD-10-CM | POA: Diagnosis not present

## 2022-01-29 DIAGNOSIS — F250 Schizoaffective disorder, bipolar type: Secondary | ICD-10-CM | POA: Diagnosis not present

## 2022-01-31 LAB — HM DIABETES EYE EXAM

## 2022-02-07 ENCOUNTER — Encounter: Payer: Self-pay | Admitting: Family Medicine

## 2022-02-08 ENCOUNTER — Ambulatory Visit (INDEPENDENT_AMBULATORY_CARE_PROVIDER_SITE_OTHER): Payer: Medicare HMO | Admitting: Family Medicine

## 2022-02-08 VITALS — BP 124/82 | HR 70 | Ht 71.0 in | Wt 241.0 lb

## 2022-02-08 DIAGNOSIS — Z0000 Encounter for general adult medical examination without abnormal findings: Secondary | ICD-10-CM

## 2022-02-08 DIAGNOSIS — E1169 Type 2 diabetes mellitus with other specified complication: Secondary | ICD-10-CM

## 2022-02-08 DIAGNOSIS — I428 Other cardiomyopathies: Secondary | ICD-10-CM | POA: Diagnosis not present

## 2022-02-08 DIAGNOSIS — E785 Hyperlipidemia, unspecified: Secondary | ICD-10-CM | POA: Diagnosis not present

## 2022-02-08 DIAGNOSIS — E66811 Obesity, class 1: Secondary | ICD-10-CM

## 2022-02-08 DIAGNOSIS — F200 Paranoid schizophrenia: Secondary | ICD-10-CM | POA: Diagnosis not present

## 2022-02-08 DIAGNOSIS — J432 Centrilobular emphysema: Secondary | ICD-10-CM

## 2022-02-08 DIAGNOSIS — E669 Obesity, unspecified: Secondary | ICD-10-CM

## 2022-02-08 DIAGNOSIS — R69 Illness, unspecified: Secondary | ICD-10-CM | POA: Diagnosis not present

## 2022-02-08 DIAGNOSIS — Z125 Encounter for screening for malignant neoplasm of prostate: Secondary | ICD-10-CM

## 2022-02-08 DIAGNOSIS — I2510 Atherosclerotic heart disease of native coronary artery without angina pectoris: Secondary | ICD-10-CM

## 2022-02-08 NOTE — Assessment & Plan Note (Signed)
Prior A1c controlled Check Lab today with rest of blood work Complications - hyperlipidemia, GERD, depression, schizophrenia, obesity, OSA -  increases risk of future cardiovascular complications   Plan:  1. Continue  Metformin XR '500mg'$  BID or x 2 = '1000mg'$  daily with PM meal 2. Encourage improved lifestyle - low carb, low sugar diet, reduce portion size, continue improving regular exercise 3. Check CBG , bring log to next visit for review 4. Continue Statin Urine micro today

## 2022-02-08 NOTE — Assessment & Plan Note (Signed)
Followed by Cardiology On med management

## 2022-02-08 NOTE — Progress Notes (Signed)
Subjective:    Patient ID: Randy [name], male    DOB: [birth year], 51 y.o.   MRN: [id number]  Randy [name] is a 51 y.o. male presenting on 02/08/2022 for Annual Exam   HPI  Here for Annual Physical and Lab Review  Schizophrenia / Major Depression recurrent Followed by [facility] for specialized Schizophrenia patients, chart reviewed from last visit, documentation shows patient has history of fetal alcohol syndrome, recurrent major depression and schizophrenia.  - Today he is reporting overall doing well, there was some discussion about adjusting his medications recently based on chart review. On Abilify '20mg'$  ('10mg'$  x 2), Depakote ER '500mg'$  x 2 = '1000mg'$  nightly, Doxepin '20mg'$  nightly, Fluoxetine '20mg'$  daily   Type 2 Diabetes / Morbid Obesity BMI    anti-psychotic/schizophrenia medication causing hyperglycemia Meds:Metformin XR '500mg'$  BID admits some GI intolerance Interested in [place] now  Lifestyle: Improved diet - Exercise (Walking regularly for activity and exercise) [place] [facility], need upcoming report. Denies hypoglycemia, polyuria, visual changes, numbness or tingling   History of cancer He has reduced tobacco / chew  Health Maintenance: Last Colonoscopy 2019, advised repeat in 1 year, now 4 years late the has Colonoscopy scheduled for July 2024     02/08/2022    8:32 AM 09/28/2021    2:04 PM 06/09/2021    9:35 AM  Depression screen PHQ 2/9  Decreased Interest 1 2 0  Down, Depressed, Hopeless 1 0 0  PHQ - 2 Score 2 2 0  Altered sleeping 1 2 0  Tired, decreased energy '2 2 3  '$ Change in appetite '1 3 2  '$ Feeling bad or failure about yourself  1 0 0  Trouble concentrating '2 1 1  '$ Moving slowly or fidgety/restless '1 1 1  '$ Suicidal thoughts 1 0 0  PHQ-9 Score '11 11 7  '$ Difficult doing work/chores Somewhat difficult Very difficult Somewhat difficult    Past Medical History:  Diagnosis Date   Allergy    Bipolar 1 disorder (HCC)    COPD (chronic obstructive pulmonary  disease) (HCC)    GERD (gastroesophageal reflux disease)    LBBB (left bundle branch block)    Nonischemic cardiomyopathy ([place]) 05/30/2021   LVEF 35-40%; mild, non-obstructive CAD by LHC.   Past Surgical History:  Procedure Laterality Date   RIGHT/LEFT HEART CATH AND CORONARY ANGIOGRAPHY N/A 05/31/2021   Procedure: RIGHT/LEFT HEART CATH AND CORONARY ANGIOGRAPHY;  Surgeon: Nelva Bush, MD;  Location: [facility];  Service: Cardiovascular;  Laterality: N/A;   Social History   Socioeconomic History   Marital status: Married    Spouse name: Randy [name]   Number of children: 3   Years of education: Not on file   Highest education level: 11th grade  Occupational History   Occupation: disability   Tobacco Use   Smoking status: Former    Packs/day: 1.50    Years: 5.00    Total pack years: 7.50    Types: Cigarettes    Quit date: 03/26/2001    Years since quitting: 20.8   Smokeless tobacco: Current    Types: Chew  Vaping Use   Vaping Use: Former  Substance and Sexual Activity   Alcohol use: No   Drug use: Never   Sexual activity: Yes  Other Topics Concern   Not on file  Social History Narrative   Ongoing financial constraints mentioned during phone call today   Social Determinants of Health   Financial Resource Strain: Medium Risk (09/28/2021)   Overall  Financial Resource Strain (CARDIA)    Difficulty of Paying Living Expenses: Somewhat hard  Food Insecurity: No Food Insecurity (09/28/2021)   Hunger Vital Sign    Worried About Running Out of Food in the Last Year: Never true    Ran Out of Food in the Last Year: Never true  Transportation Needs: No Transportation Needs (09/28/2021)   PRAPARE - Hydrologist (Medical): No    Lack of Transportation (Non-Medical): No  Physical Activity: Insufficiently Active (09/27/2020)   Exercise Vital Sign    Days of Exercise per Week: 3 days    Minutes of Exercise per Session: 40 min  Stress: No Stress  Concern Present (09/27/2020)   [place]    Feeling of Stress : Not at all  Social Connections: Moderately Isolated (09/28/2021)   Social Connection and Isolation Panel [NHANES]    Frequency of Communication with Friends and Family: More than three times a week    Frequency of Social Gatherings with Friends and Family: More than three times a week    Attends Religious Services: Never    Marine scientist or Organizations: No    Attends Archivist Meetings: Never    Marital Status: Married  Human resources officer Violence: Not At Risk (09/28/2021)   Humiliation, Afraid, Rape, and Kick questionnaire    Fear of Current or Ex-Partner: No    Emotionally Abused: No    Physically Abused: No    Sexually Abused: No   Family History  Problem Relation Age of Onset   Alcohol abuse Mother    Cancer Father        colon    Colon cancer Father    Current Outpatient Medications on File Prior to Visit  Medication Sig   albuterol (PROAIR HFA) 108 (90 Base) MCG/ACT inhaler Inhale 1-2 puffs into the lungs every 6 (six) hours as needed for wheezing or shortness of breath.   ARIPiprazole (ABILIFY) 10 MG tablet Take 2 tablets (20 mg total) by mouth daily.   atorvastatin (LIPITOR) 40 MG tablet TAKE ONE TABLET BY MOUTH DAILY   cetirizine (ZYRTEC) 10 MG tablet Take 10 mg by mouth daily as needed for allergies.   divalproex (DEPAKOTE) 500 MG DR tablet Take 2 tablets nightly   furosemide (LASIX) 40 MG tablet Take 1 tablet by mouth daily.   losartan (COZAAR) 25 MG tablet TAKE 1/2 TABLET BY MOUTH DAILY   metFORMIN (GLUCOPHAGE-XR) 500 MG 24 hr tablet Take 1 tablet (500 mg total) by mouth 2 (two) times daily with a meal.   metoprolol succinate (TOPROL-XL) 25 MG 24 hr tablet Take 0.5 tablets (12.5 mg total) by mouth daily.   mirtazapine (REMERON) 15 MG tablet TAKE 1/2 TABLET (7.'5mg'$ ) BY MOUTH each night BEFORE bed.   Paliperidone Palmitate  (INVEGA TRINZA IM) Inject 1 each into the muscle every 30 (thirty) days.   doxepin (SINEQUAN) 10 MG capsule Take 2 capsules (20 mg total) by mouth at bedtime.   FLUoxetine (PROZAC) 10 MG capsule Take 1 capsule (10 mg total) by mouth daily.   No current facility-administered medications on file prior to visit.    Review of Systems  Constitutional:  Negative for activity change, appetite change, chills, diaphoresis, fatigue and fever.  HENT:  Negative for congestion and hearing loss.   Eyes:  Negative for visual disturbance.  Respiratory:  Negative for cough, chest tightness, shortness of breath and wheezing.   Cardiovascular:  Negative for chest pain, palpitations and leg swelling.  Gastrointestinal:  Negative for abdominal pain, constipation, diarrhea, nausea and vomiting.  Genitourinary:  Negative for dysuria, frequency and hematuria.  Musculoskeletal:  Negative for arthralgias and neck pain.  Skin:  Negative for rash.  Neurological:  Negative for dizziness, weakness, light-headedness, numbness and headaches.  Hematological:  Negative for adenopathy.  Psychiatric/Behavioral:  Negative for behavioral problems, dysphoric mood and sleep disturbance.    Per HPI unless specifically indicated above      Objective:    BP 124/82 (BP Location: Left Arm, Cuff Size: Normal)   Pulse 70   Ht '5\' 11"'$  (1.803 m)   Wt 241 lb (109.3 kg)   SpO2 98%   BMI 33.61 kg/m   Wt Readings from Last 3 Encounters:  02/08/22 241 lb (109.3 kg)  10/16/21 244 lb 11.4 oz (111 kg)  09/28/21 246 lb 9.6 oz (111.9 kg)    Physical Exam Vitals and nursing note reviewed.  Constitutional:      General: He is not in acute distress.    Appearance: He is well-developed. He is not diaphoretic.     Comments: Well-appearing, comfortable, cooperative  HENT:     Head: Normocephalic and atraumatic.  Eyes:     General:        Right eye: No discharge.        Left eye: No discharge.     Conjunctiva/sclera: Conjunctivae  normal.     Pupils: Pupils are equal, round, and reactive to light.  Neck:     Thyroid: No thyromegaly.  Cardiovascular:     Rate and Rhythm: Normal rate and regular rhythm.     Pulses: Normal pulses.     Heart sounds: Normal heart sounds. No murmur heard. Pulmonary:     Effort: Pulmonary effort is normal. No respiratory distress.     Breath sounds: Normal breath sounds. No wheezing or rales.  Abdominal:     General: Bowel sounds are normal. There is no distension.     Palpations: Abdomen is soft. There is no mass.     Tenderness: There is no abdominal tenderness.  Musculoskeletal:        General: No tenderness. Normal range of motion.     Cervical back: Normal range of motion and neck supple.     Comments: Upper / Lower Extremities: - Normal muscle tone, strength bilateral upper extremities 5/5, lower extremities 5/5  Lymphadenopathy:     Cervical: No cervical adenopathy.  Skin:    General: Skin is warm and dry.     Findings: No erythema or rash.  Neurological:     Mental Status: He is alert and oriented to person, place, and time.     Comments: Distal sensation intact to light touch all extremities  Psychiatric:        Mood and Affect: Mood normal.        Behavior: Behavior normal.        Thought Content: Thought content normal.     Comments: Well groomed, good eye contact, normal speech and thoughts     Diabetic Foot Exam - Simple   Simple Foot Form Diabetic Foot exam was performed with the following findings: Yes 02/08/2022  8:42 AM  Visual Inspection No deformities, no ulcerations, no other skin breakdown bilaterally: Yes Sensation Testing Intact to touch and monofilament testing bilaterally: Yes Pulse Check Posterior Tibialis and Dorsalis pulse intact bilaterally: Yes Comments      Results for orders placed or performed in  visit on 02/07/22  HM DIABETES EYE EXAM  Result Value Ref Range   HM Diabetic Eye Exam No Retinopathy No Retinopathy      Assessment &  Plan:   Problem List Items Addressed This Visit     CAD in native artery    Followed by Cardiology On med management      Relevant Orders   COMPLETE METABOLIC PANEL WITH GFR   Lipid panel   Centrilobular emphysema (HCC)    Stable chronic problem with COPD Followed by [place] [facility]  Previously on maintenance therapy - currently off On Albuterol PRN rarely using      Hyperlipidemia associated with type 2 diabetes mellitus ([place])    Check fasting lipid panel today On statin      Nonischemic cardiomyopathy ([place])   Obesity (BMI 30.0-34.9)   Schizophrenia ([place])    Chronic Schizophrenia, relatively stable Followed by [facility] On med management per [facility]      Relevant Orders   COMPLETE METABOLIC PANEL WITH GFR   Lipid panel   Type 2 diabetes mellitus with other specified complication (HCC)    Prior A1c controlled Check Lab today with rest of blood work Complications - hyperlipidemia, GERD, depression, schizophrenia, obesity, OSA -  increases risk of future cardiovascular complications   Plan:  1. Continue  Metformin XR '500mg'$  BID or x 2 = '1000mg'$  daily with PM meal 2. Encourage improved lifestyle - low carb, low sugar diet, reduce portion size, continue improving regular exercise 3. Check CBG , bring log to next visit for review 4. Continue Statin Urine micro today      Relevant Orders   COMPLETE METABOLIC PANEL WITH GFR   Hemoglobin A1c   Urine Microalbumin w/creat. ratio   Other Visit Diagnoses     Annual physical exam    -  Primary   Relevant Orders   COMPLETE METABOLIC PANEL WITH GFR   Lipid panel   Screening for prostate cancer       Relevant Orders   PSA       Updated Health Maintenance information Fasting labs ordered today, follow up results. Encouraged improvement to lifestyle with diet and exercise Goal of weight loss   No orders of the defined types were placed in this encounter.     Follow up plan: Return in about 4 months  (around 06/09/2022) for 4 month follow-up DM A1c.  [name], [facility] 02/08/2022, 8:39 AM

## 2022-02-08 NOTE — Assessment & Plan Note (Signed)
Check fasting lipid panel today On statin

## 2022-02-08 NOTE — Assessment & Plan Note (Signed)
Chronic Schizophrenia, relatively stable Followed by [facility] On med management per [facility]

## 2022-02-08 NOTE — Patient Instructions (Addendum)
Thank you for coming to the office today.  Blood work and urine test today.  Free sample Ozempic lasts 6 weeks, call insurance and confirm it will be covered and let me know if you want me to re order. Please call or message back in 3-4 weeks after using Ozempic if you want me to order it.   Ozempic (Semaglutide injection) - start 0.'25mg'$  weekly for 4 weeks then increase to 0.'5mg'$  weekly, sample is 6 doses, re-use the same pen until empty, new needle each dose.  Keep on current meds    Please schedule a Follow-up Appointment to: Return in about 4 months (around 06/09/2022) for 4 month follow-up DM A1c.  If you have any other questions or concerns, please feel free to call the office or send a message through [place]. You may also schedule an earlier appointment if necessary.  Additionally, you may be receiving a survey about your experience at our office within a few days to 1 week by e-mail or mail. We value your feedback.  [name], DO [place]

## 2022-02-08 NOTE — Assessment & Plan Note (Signed)
Stable chronic problem with COPD Followed by Randy [name]  Previously on maintenance therapy - currently off On Albuterol PRN rarely using

## 2022-02-09 LAB — HEMOGLOBIN A1C
Hgb A1c MFr Bld: 5.9 % of total Hgb — ABNORMAL HIGH (ref <5.7)
Mean Plasma Glucose: 123 mg/dL
eAG (mmol/L): 6.8 mmol/L

## 2022-02-09 LAB — COMPLETE METABOLIC PANEL WITH GFR
AG Ratio: 1.6 (calc) (ref 1.0–2.5)
ALT: 20 U/L (ref 9–46)
AST: 17 U/L (ref 10–35)
Albumin: 4.6 g/dL (ref 3.6–5.1)
Alkaline phosphatase (APISO): 57 U/L (ref 35–144)
BUN: 16 mg/dL (ref 7–25)
CO2: 25 mmol/L (ref 20–32)
Calcium: 9.7 mg/dL (ref 8.6–10.3)
Chloride: 103 mmol/L (ref 98–110)
Creat: 0.9 mg/dL (ref 0.70–1.30)
Globulin: 2.9 g/dL (calc) (ref 1.9–3.7)
Glucose, Bld: 100 mg/dL — ABNORMAL HIGH (ref 65–99)
Potassium: 4.3 mmol/L (ref 3.5–5.3)
Sodium: 138 mmol/L (ref 135–146)
Total Bilirubin: 0.4 mg/dL (ref 0.2–1.2)
Total Protein: 7.5 g/dL (ref 6.1–8.1)
eGFR: 103 mL/min/{1.73_m2} (ref >=60)

## 2022-02-09 LAB — LIPID PANEL
Cholesterol: 185 mg/dL (ref <200)
HDL: 50 mg/dL (ref >=40)
LDL Cholesterol (Calc): 99 mg/dL (calc)
Non-HDL Cholesterol (Calc): 135 mg/dL (calc) — ABNORMAL HIGH (ref <130)
Total CHOL/HDL Ratio: 3.7 (calc) (ref <5.0)
Triglycerides: 250 mg/dL — ABNORMAL HIGH (ref <150)

## 2022-02-09 LAB — MICROALBUMIN / CREATININE URINE RATIO
Creatinine, Urine: 147 mg/dL (ref 20–320)
Microalb Creat Ratio: 3 mcg/mg creat (ref <30)
Microalb, Ur: 0.5 mg/dL

## 2022-02-09 LAB — PSA: PSA: 0.76 ng/mL (ref <=4.00)

## 2022-02-26 DIAGNOSIS — R69 Illness, unspecified: Secondary | ICD-10-CM | POA: Diagnosis not present

## 2022-02-26 DIAGNOSIS — F250 Schizoaffective disorder, bipolar type: Secondary | ICD-10-CM | POA: Diagnosis not present

## 2022-03-02 MED FILL — ENTRESTO 49 MG-51 MG TABLET: ORAL | 30 days supply | Qty: 60 | Fill #1

## 2022-03-02 MED FILL — JARDIANCE 10 MG TABLET: ORAL | 60 days supply | Qty: 60 | Fill #1

## 2022-03-14 ENCOUNTER — Ambulatory Visit: Admit: 2022-03-14 | Discharge: 2022-03-15 | Payer: MEDICARE

## 2022-03-14 ENCOUNTER — Emergency Department: Admit: 2022-03-14 | Discharge: 2022-03-15 | Disposition: A | Discharge status: Home / Self Care | Payer: MEDICARE

## 2022-03-14 ENCOUNTER — Ambulatory Visit: Admit: 2022-03-14 | Discharge: 2022-03-15 | Disposition: A | Discharge status: Home / Self Care | Payer: MEDICARE

## 2022-03-14 DIAGNOSIS — Z7984 Long term (current) use of oral hypoglycemic drugs: Secondary | ICD-10-CM | POA: Diagnosis not present

## 2022-03-14 DIAGNOSIS — I959 Hypotension, unspecified: Secondary | ICD-10-CM | POA: Diagnosis not present

## 2022-03-14 DIAGNOSIS — F17220 Nicotine dependence, chewing tobacco, uncomplicated: Secondary | ICD-10-CM | POA: Diagnosis not present

## 2022-03-14 DIAGNOSIS — R197 Diarrhea, unspecified: Secondary | ICD-10-CM | POA: Diagnosis not present

## 2022-03-14 DIAGNOSIS — E860 Dehydration: Secondary | ICD-10-CM | POA: Diagnosis not present

## 2022-03-14 DIAGNOSIS — R42 Dizziness and giddiness: Secondary | ICD-10-CM | POA: Diagnosis not present

## 2022-03-14 DIAGNOSIS — R112 Nausea with vomiting, unspecified: Secondary | ICD-10-CM | POA: Diagnosis not present

## 2022-03-14 DIAGNOSIS — J449 Chronic obstructive pulmonary disease, unspecified: Secondary | ICD-10-CM | POA: Diagnosis not present

## 2022-03-14 DIAGNOSIS — E785 Hyperlipidemia, unspecified: Secondary | ICD-10-CM | POA: Diagnosis not present

## 2022-03-14 DIAGNOSIS — J069 Acute upper respiratory infection, unspecified: Secondary | ICD-10-CM | POA: Diagnosis not present

## 2022-03-14 DIAGNOSIS — I509 Heart failure, unspecified: Secondary | ICD-10-CM | POA: Diagnosis not present

## 2022-03-14 DIAGNOSIS — I447 Left bundle-branch block, unspecified: Secondary | ICD-10-CM | POA: Diagnosis not present

## 2022-03-14 DIAGNOSIS — R110 Nausea: Secondary | ICD-10-CM | POA: Diagnosis not present

## 2022-03-14 DIAGNOSIS — R9431 Abnormal electrocardiogram [ECG] [EKG]: Secondary | ICD-10-CM | POA: Diagnosis not present

## 2022-03-14 DIAGNOSIS — R059 Cough, unspecified: Secondary | ICD-10-CM | POA: Diagnosis not present

## 2022-03-14 DIAGNOSIS — R1110 Vomiting, unspecified: Secondary | ICD-10-CM | POA: Diagnosis not present

## 2022-03-14 DIAGNOSIS — R918 Other nonspecific abnormal finding of lung field: Secondary | ICD-10-CM | POA: Diagnosis not present

## 2022-03-14 DIAGNOSIS — R55 Syncope and collapse: Secondary | ICD-10-CM | POA: Diagnosis not present

## 2022-03-14 DIAGNOSIS — Z79899 Other long term (current) drug therapy: Secondary | ICD-10-CM | POA: Diagnosis not present

## 2022-03-14 DIAGNOSIS — R001 Bradycardia, unspecified: Secondary | ICD-10-CM | POA: Diagnosis not present

## 2022-03-14 DIAGNOSIS — R69 Illness, unspecified: Secondary | ICD-10-CM | POA: Diagnosis not present

## 2022-03-14 DIAGNOSIS — R509 Fever, unspecified: Secondary | ICD-10-CM | POA: Diagnosis not present

## 2022-03-14 DIAGNOSIS — R0981 Nasal congestion: Secondary | ICD-10-CM | POA: Diagnosis not present

## 2022-03-14 DIAGNOSIS — E119 Type 2 diabetes mellitus without complications: Secondary | ICD-10-CM | POA: Diagnosis not present

## 2022-03-14 DIAGNOSIS — G4733 Obstructive sleep apnea (adult) (pediatric): Secondary | ICD-10-CM | POA: Diagnosis not present

## 2022-03-14 DIAGNOSIS — J111 Influenza due to unidentified influenza virus with other respiratory manifestations: Secondary | ICD-10-CM | POA: Diagnosis not present

## 2022-03-15 DIAGNOSIS — J111 Influenza due to unidentified influenza virus with other respiratory manifestations: Secondary | ICD-10-CM | POA: Diagnosis not present

## 2022-03-21 ENCOUNTER — Ambulatory Visit
Admission: RE | Admit: 2022-03-21 | Discharge: 2022-03-21 | Disposition: A | Discharge status: Home / Self Care | Payer: Medicare HMO | Source: Ambulatory Visit | Attending: Family Medicine | Admitting: Family Medicine

## 2022-03-21 ENCOUNTER — Ambulatory Visit (INDEPENDENT_AMBULATORY_CARE_PROVIDER_SITE_OTHER): Payer: Medicare HMO | Admitting: Family Medicine

## 2022-03-21 ENCOUNTER — Other Ambulatory Visit: Payer: Self-pay | Admitting: Family Medicine

## 2022-03-21 ENCOUNTER — Encounter: Payer: Self-pay | Admitting: Family Medicine

## 2022-03-21 VITALS — HR 89 | Resp 16

## 2022-03-21 DIAGNOSIS — G9331 Postviral fatigue syndrome: Secondary | ICD-10-CM | POA: Diagnosis not present

## 2022-03-21 DIAGNOSIS — J101 Influenza due to other identified influenza virus with other respiratory manifestations: Secondary | ICD-10-CM

## 2022-03-21 DIAGNOSIS — R197 Diarrhea, unspecified: Secondary | ICD-10-CM

## 2022-03-21 DIAGNOSIS — R81 Glycosuria: Secondary | ICD-10-CM

## 2022-03-21 DIAGNOSIS — R059 Cough, unspecified: Secondary | ICD-10-CM | POA: Diagnosis not present

## 2022-03-21 DIAGNOSIS — B3749 Other urogenital candidiasis: Secondary | ICD-10-CM | POA: Diagnosis not present

## 2022-03-21 DIAGNOSIS — R110 Nausea: Secondary | ICD-10-CM | POA: Diagnosis not present

## 2022-03-21 DIAGNOSIS — E1169 Type 2 diabetes mellitus with other specified complication: Secondary | ICD-10-CM

## 2022-03-21 DIAGNOSIS — R6889 Other general symptoms and signs: Secondary | ICD-10-CM | POA: Diagnosis not present

## 2022-03-21 MED ORDER — OSELTAMIVIR PHOSPHATE 75 MG PO CAPS: 75.0000 mg | ORAL_CAPSULE | Freq: Two times a day (BID) | ORAL | 0 refills | Status: DC

## 2022-03-21 MED ORDER — FLUCONAZOLE 200 MG PO TABS: 200.0000 mg | ORAL_TABLET | Freq: Every day | ORAL | 0 refills | Status: DC

## 2022-03-21 MED ORDER — SPIRONOLACTONE 25 MG TABLET
ORAL_TABLET | Freq: Every day | ORAL | 0 refills | 30 days | Status: CP
Start: 2022-03-21 — End: ?

## 2022-03-21 NOTE — Progress Notes (Signed)
Subjective:    Patient ID: Randy [name], male    DOB: 01/19/1971, 51 y.o.   MRN: [id number]  Randy [name] is a 51 y.o. male presenting on 03/21/2022 for ER Follow up (On 12/20 was at [facility] ER and was dx with Flu A.  No treatment was given due to patient being sick for a week prior to being dx.  He is still c/o diarrhea, vomiting and coughing. )  Patient presents for a same day appointment.  HPI  Dysuria On SGLT2 Jardiance Last urine done 12/20 by ED showed >1000 glucose glucosuria, no bacteria. No culture done He has burning still with urination  Influenza A Positive Recently dx with Influenza at ED 12/20 he had sick contact. Thought to be too late for Tamiflu he was not treated. No recent Chest X-ray done Still has coughing Admits nausea without vomiting Admits some diarrhea  Flu Shot updated 01/2022      02/08/2022    8:32 AM 09/28/2021    2:04 PM 06/09/2021    9:35 AM  Depression screen PHQ 2/9  Decreased Interest 1 2 0  Down, Depressed, Hopeless 1 0 0  PHQ - 2 Score 2 2 0  Altered sleeping 1 2 0  Tired, decreased energy _0 Change in appetite _1 Feeling bad or failure about yourself  1 0 0  Trouble concentrating _2 Moving slowly or fidgety/restless _3 Suicidal thoughts 1 0 0  PHQ-9 Score _4 Difficult doing work/chores Somewhat difficult Very difficult Somewhat difficult    Social History   Tobacco Use   Smoking status: Former    Packs/day: 1.50    Years: 5.00    Total pack years: 7.50    Types: Cigarettes    Quit date: 03/26/2001    Years since quitting: 21.0   Smokeless tobacco: Current    Types: Chew  Vaping Use   Vaping Use: Former  Substance Use Topics   Alcohol use: No   Drug use: Never    Review of Systems Per HPI unless specifically indicated above     Objective:    Pulse 89   Resp 16   SpO2 97%   Wt Readings from Last 3 Encounters:  02/08/22 241 lb (109.3 kg)  10/16/21 244 lb 11.4 oz (111 kg)  09/28/21 246 lb  9.6 oz (111.9 kg)    Physical Exam Vitals and nursing note reviewed.  Constitutional:      General: He is not in acute distress.    Appearance: He is well-developed. He is not diaphoretic.     Comments: Well-appearing, comfortable, cooperative  HENT:     Head: Normocephalic and atraumatic.  Eyes:     General:        Right eye: No discharge.        Left eye: No discharge.     Conjunctiva/sclera: Conjunctivae normal.  Neck:     Thyroid: No thyromegaly.  Cardiovascular:     Rate and Rhythm: Normal rate and regular rhythm.     Pulses: Normal pulses.     Heart sounds: Normal heart sounds. No murmur heard. Pulmonary:     Effort: Pulmonary effort is normal. No respiratory distress.     Breath sounds: Rhonchi present. No wheezing or rales.  Musculoskeletal:        General: Normal range of motion.     Cervical back: Normal range of motion and neck supple.  Lymphadenopathy:     Cervical: No cervical adenopathy.  Skin:    General: Skin is warm and dry.     Findings: No erythema or rash.  Neurological:     Mental Status: He is alert and oriented to person, place, and time. Mental status is at baseline.  Psychiatric:        Behavior: Behavior normal.     Comments: Well groomed, good eye contact, normal speech and thoughts    Results for orders placed or performed in visit on 02/08/22  COMPLETE METABOLIC PANEL WITH GFR  Result Value Ref Range   Glucose, Bld 100 (H) 65 - 99 mg/dL   BUN 16 7 - 25 mg/dL   Creat 0.90 0.70 - 1.30 mg/dL   eGFR 103 > OR = 60 mL/min/1.47m   BUN/Creatinine Ratio SEE NOTE: 6 - 22 (calc)   Sodium 138 135 - 146 mmol/L   Potassium 4.3 3.5 - 5.3 mmol/L   Chloride 103 98 - 110 mmol/L   CO2 25 20 - 32 mmol/L   Calcium 9.7 8.6 - 10.3 mg/dL   Total Protein 7.5 6.1 - 8.1 g/dL   Albumin 4.6 3.6 - 5.1 g/dL   Globulin 2.9 1.9 - 3.7 g/dL (calc)   AG Ratio 1.6 1.0 - 2.5 (calc)   Total Bilirubin 0.4 0.2 - 1.2 mg/dL   Alkaline phosphatase (APISO) 57 35 - 144 U/L    AST 17 10 - 35 U/L   ALT 20 9 - 46 U/L  Lipid panel  Result Value Ref Range   Cholesterol 185 <200 mg/dL   HDL 50 > OR = 40 mg/dL   Triglycerides 250 (H) <150 mg/dL   LDL Cholesterol (Calc) 99 mg/dL (calc)   Total CHOL/HDL Ratio 3.7 <5.0 (calc)   Non-HDL Cholesterol (Calc) 135 (H) <130 mg/dL (calc)  Hemoglobin A1c  Result Value Ref Range   Hgb A1c MFr Bld 5.9 (H) <5.7 % of total Hgb   Mean Plasma Glucose 123 mg/dL   eAG (mmol/L) 6.8 mmol/L  PSA  Result Value Ref Range   PSA 0.76 < OR = 4.00 ng/mL  Urine Microalbumin w/creat. ratio  Result Value Ref Range   Creatinine, Urine 147 20 - 320 mg/dL   Microalb, Ur 0.5 mg/dL   Microalb Creat Ratio 3 <30 mcg/mg creat   I have personally reviewed the radiology report from 03/21/22 on STAT CXR.  CLINICAL DATA:  Post influenza.  Cough.   EXAM: CHEST - 2 VIEW   COMPARISON:  Chest two views 10/16/2021 and 09/05/2021; CTA chest 05/29/2021   FINDINGS: Cardiac silhouette and mediastinal contours are within normal limits. Mild calcification within the aortic arch. Mildly decreased lung volumes. No focal airspace opacity to indicate pneumonia long-term stable right upper lung opacity with interlobular septal thickening, unchanged from multiple prior radiographs and CT chest 05/29/2021 suggesting fibrosis. Degenerative disc changes of the thoracic spine.   IMPRESSION: 1. No active cardiopulmonary disease. 2. Stable right upper lung opacity with interlobular septal thickening suggesting chronic fibrosis.     Electronically Signed   By: RYvonne KendallM.D.   On: 03/21/2022 11:17     Assessment & Plan:   Problem List Items Addressed This Visit   None Visit Diagnoses     Yeast UTI    -  Primary   Relevant Medications   fluconazole (DIFLUCAN) 200 MG tablet   oseltamivir (TAMIFLU) 75 MG capsule   Other Relevant Orders   Urinalysis, Routine w reflex microscopic  Urine Culture   Glucosuria       Relevant Orders   Urinalysis,  Routine w reflex microscopic   Urine Culture   Flu-like symptoms       Relevant Medications   oseltamivir (TAMIFLU) 75 MG capsule   Other Relevant Orders   DG Chest 2 View   Diarrhea, unspecified type       Nausea       Influenza A       Relevant Medications   fluconazole (DIFLUCAN) 200 MG tablet   oseltamivir (TAMIFLU) 75 MG capsule   Other Relevant Orders   DG Chest 2 View   Post-influenza syndrome       Relevant Orders   DG Chest 2 View       Recent Influenza A Likely contributing to some of his symptoms still Reviewed [facility] 12/20 ED records  Possible post flu complication with post viral pneumonia, cannot rule out  Order CXR at Outpatient imaging center, he will go today  Start Tamiflu for recent history of flu just in case it is still present.  Dysuria May be candidal yeast UTI  The urine has sugar in it due to Jardiance medication, this is normal But it may be causing a Yeast UTI Urinalysis test today to evaluate urine and check if any bacteria or yeast.  Start Diflucan 215m once daily x 1-2 weeks, if resolved urinary symptoms burning after 1 week then you can stop this medication, if not resolved finish 2 weeks  Stay tuned for X-ray results follow up to see if any pneumonia or other concerns. - Updated X-ray is negative for acute pneumonia or any other abnormality, it shows some chronic changes. Attempted to call with results.  [facility] [place] [address] [place]  [id number] [phone]  Orders Placed This Encounter  Procedures   Urine Culture   DG Chest 2 View    Standing Status:   Future    Standing Expiration Date:   03/22/2023    Order Specific Question:   Reason for Exam (SYMPTOM  OR DIAGNOSIS REQUIRED)    Answer:   post influenza cough still sick    Order Specific Question:   Preferred imaging location?    Answer:   [facility]  Urinalysis, Routine w reflex microscopic     Meds ordered this  encounter  Medications   fluconazole (DIFLUCAN) 200 MG tablet    Sig: Take 1 tablet (200 mg total) by mouth daily. For 1-2 weeks for yeast UTI. If it is resolved in 1 week, you can hold the med.    Dispense:  14 tablet    Refill:  0   oseltamivir (TAMIFLU) 75 MG capsule    Sig: Take 1 capsule (75 mg total) by mouth 2 (two) times daily. For 5 days    Dispense:  10 capsule    Refill:  0     Follow up plan: Return if symptoms worsen or fail to improve.   ANobie Putnam DO [facility] 03/21/2022, 10:22 AM

## 2022-03-21 NOTE — Patient Instructions (Addendum)
Thank you for coming to the office today.  Start Tamiflu for recent history of flu just in case it is still present.  The urine has sugar in it due to Jardiance medication, this is normal But it may be causing a Yeast UTI  Urine test today to evaluate urine and check if any bacteria or yeast.  Start Diflucan '200mg'$  once daily x 1-2 weeks, if resolved urinary symptoms burning after 1 week then you can stop this medication, if not resolved finish 2 weeks  Stay tuned for X-ray results follow up to see if any pneumonia or other concerns.  Outpatient Imaging [facility] [address] [place], [place] [address]  [phone]   Please schedule a Follow-up Appointment to: Return if symptoms worsen or fail to improve.  If you have any other questions or concerns, please feel free to call the office or send a message through [place]. You may also schedule an earlier appointment if necessary.  Additionally, you may be receiving a survey about your experience at our office within a few days to 1 week by e-mail or mail. We value your feedback.  [name], DO [place]

## 2022-03-21 NOTE — Telephone Encounter (Signed)
Requested Prescriptions  Pending Prescriptions Disp Refills   losartan (COZAAR) 25 MG tablet [Pharmacy Med Name: LOSARTAN POTASSIUM 25 MG TAB] 45 tablet 0    Sig: TAKE 1/2 TABLET BY MOUTH DAILY     Cardiovascular:  Angiotensin Receptor Blockers Passed - 03/21/2022  6:22 AM      Passed - Cr in normal range and within 180 days    Creat  Date Value Ref Range Status  02/08/2022 0.90 0.70 - 1.30 mg/dL Final   Creatinine, Urine  Date Value Ref Range Status  02/08/2022 147 20 - 320 mg/dL Final         Passed - K in normal range and within 180 days    Potassium  Date Value Ref Range Status  02/08/2022 4.3 3.5 - 5.3 mmol/L Final  04/15/2014 3.6 3.5 - 5.1 mmol/L Final         Passed - Patient is not pregnant      Passed - Last BP in normal range    BP Readings from Last 1 Encounters:  02/08/22 124/82         Passed - Valid encounter within last 6 months    Recent Outpatient Visits           1 month ago Annual physical exam   [facility] Olin Hauser, DO   9 months ago Acute HFrEF (heart failure with reduced ejection fraction) [facility])   [place], DO   1 year ago Annual physical exam   [facility] Olin Hauser, DO   1 year ago Type 2 diabetes mellitus with other specified complication, without long-term current use of insulin ([place])   [facility] Olin Hauser, DO   2 years ago Paranoid schizophrenia [facility])   [place], Devonne Doughty, DO       Future Appointments             Today [place] Devonne Doughty, DO [facility]             atorvastatin (LIPITOR) 40 MG tablet [Pharmacy Med Name: ATORVASTATIN 40 MG TABLET] 90 tablet 0    Sig: TAKE 1 TABLET BY MOUTH DAILY     Cardiovascular:  Antilipid - Statins Failed - 03/21/2022  6:22 AM      Failed - Lipid Panel in normal range within the last 12 months     Cholesterol  Date Value Ref Range Status  02/08/2022 185 <200 mg/dL Final   LDL Cholesterol (Calc)  Date Value Ref Range Status  02/08/2022 99 mg/dL (calc) Final    Comment:    Reference range: <100 . Desirable range <100 mg/dL for primary prevention;   <70 mg/dL for patients with CHD or diabetic patients  with > or = 2 CHD risk factors. [name] LDL-C is now calculated using the Martin-Hopkins  calculation, which is a validated novel method providing  better accuracy than the Friedewald equation in the  estimation of LDL-C.  Cresenciano Genre et al. Annamaria Helling. 4696;295(28): 2061-2068  (http://education.Quest[employer]ostics.com/faq/FAQ164)    HDL  Date Value Ref Range Status  02/08/2022 50 > OR = 40 mg/dL Final   Triglycerides  Date Value Ref Range Status  02/08/2022 250 (H) <150 mg/dL Final    Comment:    . If a non-fasting specimen was collected, consider repeat triglyceride testing on a fasting specimen if clinically indicated.  Yates Decamp et al. J. of Clin. Lipidol.  [id number]. [name]          Passed - Patient is not pregnant      Passed - Valid encounter within last 12 months    Recent Outpatient Visits           1 month ago Annual physical exam   [facility] Olin Hauser, DO   9 months ago Acute HFrEF (heart failure with reduced ejection fraction) [facility])   [place], DO   1 year ago Annual physical exam   [facility] Olin Haus[facility], DO   1 year ago Type 2 diabetes mellitus with other specified complication, without long-term current use of insulin [facility])   [facility] Olin Hauser, DO   2 years ago Paranoid schizophrenia [facility])   [facility], Devonne Doughty, DO       Future Appointments             Today [place] Devonne Doughty, [facility], [facility]

## 2022-03-23 LAB — URINALYSIS, ROUTINE W REFLEX MICROSCOPIC
Bilirubin Urine: NEGATIVE
Glucose, UA: NEGATIVE
Hgb urine dipstick: NEGATIVE
Ketones, ur: NEGATIVE
Leukocytes,Ua: NEGATIVE
Nitrite: NEGATIVE
Protein, ur: NEGATIVE
Specific Gravity, Urine: 1.007 (ref 1.001–1.035)
pH: 5.5 (ref 5.0–8.0)

## 2022-03-23 LAB — URINE CULTURE
MICRO NUMBER:: 14362023
Result:: NO GROWTH
SPECIMEN QUALITY:: ADEQUATE

## 2022-03-29 DIAGNOSIS — R69 Illness, unspecified: Secondary | ICD-10-CM | POA: Diagnosis not present

## 2022-03-29 DIAGNOSIS — F259 Schizoaffective disorder, unspecified: Secondary | ICD-10-CM | POA: Diagnosis not present

## 2022-04-02 ENCOUNTER — Ambulatory Visit: Admit: 2022-04-02 | Discharge: 2022-04-03 | Payer: MEDICARE | Attending: Adult Health | Primary: Adult Health

## 2022-04-02 DIAGNOSIS — I447 Left bundle-branch block, unspecified: Secondary | ICD-10-CM | POA: Diagnosis not present

## 2022-04-02 DIAGNOSIS — I2510 Atherosclerotic heart disease of native coronary artery without angina pectoris: Secondary | ICD-10-CM | POA: Diagnosis not present

## 2022-04-02 DIAGNOSIS — G4733 Obstructive sleep apnea (adult) (pediatric): Secondary | ICD-10-CM | POA: Diagnosis not present

## 2022-04-02 DIAGNOSIS — I5022 Chronic systolic (congestive) heart failure: Secondary | ICD-10-CM | POA: Diagnosis not present

## 2022-04-02 MED ORDER — METOPROLOL SUCCINATE ER 25 MG TABLET,EXTENDED RELEASE 24 HR
ORAL_TABLET | Freq: Every day | ORAL | 3 refills | 180 days | Status: CP
Start: 2022-04-02 — End: ?
  Filled 2022-05-07: qty 90, 90d supply, fill #0

## 2022-04-02 MED ORDER — ATORVASTATIN 40 MG TABLET
ORAL_TABLET | Freq: Every day | ORAL | 3 refills | 90 days | Status: CP
Start: 2022-04-02 — End: ?
  Filled 2022-05-23: qty 90, 90d supply, fill #0

## 2022-04-02 MED ORDER — SPIRONOLACTONE 25 MG TABLET
ORAL_TABLET | Freq: Every day | ORAL | 3 refills | 90 days | Status: CP
Start: 2022-04-02 — End: ?
  Filled 2022-05-07: qty 90, 90d supply, fill #0

## 2022-04-02 MED ORDER — ENTRESTO 49 MG-51 MG TABLET
ORAL_TABLET | Freq: Two times a day (BID) | ORAL | 11 refills | 30.00000 days | Status: CP
Start: 2022-04-02 — End: 2022-04-02
  Filled 2022-05-07: qty 60, 30d supply, fill #0

## 2022-04-02 MED ORDER — FUROSEMIDE 40 MG TABLET
ORAL_TABLET | Freq: Every day | ORAL | 1 refills | 90 days | Status: CP
Start: 2022-04-02 — End: ?
  Filled 2022-05-07: qty 45, 90d supply, fill #0

## 2022-04-04 ENCOUNTER — Telehealth: Payer: Self-pay | Admitting: Family Medicine

## 2022-04-04 DIAGNOSIS — E1169 Type 2 diabetes mellitus with other specified complication: Secondary | ICD-10-CM

## 2022-04-04 NOTE — Telephone Encounter (Signed)
Medication Refill - Medication: Ozempic (Semaglutide injection)   Has the patient contacted their pharmacy? No. (Agent: If no, request that the patient contact the pharmacy for the refill. If patient does not wish to contact the pharmacy document the reason why and proceed with request.) (Agent: If yes, when and what did the pharmacy advise?)  Preferred Pharmacy (with phone number or street name):  [facility] [id number], [place] Phone: [phone]  Fax: [phone]     Has the patient been seen for an appointment in the last year OR does the patient have an upcoming appointment? Yes.    Agent: Please be advised that RX refills may take up to 3 business days. We ask that you follow-up with your pharmacy.  *patient was given a 6wk sample at his visit on 02/08/22. Patient states medication is working well.

## 2022-04-05 MED ORDER — OZEMPIC (0.25 OR 0.5 MG/DOSE) 2 MG/3ML ~~LOC~~ SOPN: 0.5000 mg | PEN_INJECTOR | SUBCUTANEOUS | 1 refills | Status: DC

## 2022-04-05 NOTE — Telephone Encounter (Signed)
I ordered it. But if it is not covered, we may have to switch to Trulicity or Mounjaro.  [name], [facility] 04/05/2022, 1:30 PM

## 2022-05-05 ENCOUNTER — Ambulatory Visit: Admit: 2022-05-05 | Discharge: 2022-05-06 | Payer: MEDICARE

## 2022-05-05 DIAGNOSIS — J329 Chronic sinusitis, unspecified: Secondary | ICD-10-CM | POA: Diagnosis not present

## 2022-05-05 DIAGNOSIS — B9689 Other specified bacterial agents as the cause of diseases classified elsewhere: Secondary | ICD-10-CM | POA: Diagnosis not present

## 2022-05-05 MED ORDER — BENZONATATE 200 MG CAPSULE
ORAL_CAPSULE | Freq: Three times a day (TID) | ORAL | 0 refills | 7 days | Status: CP | PRN
Start: 2022-05-05 — End: 2022-05-12

## 2022-05-05 MED ORDER — AMOXICILLIN 875 MG-POTASSIUM CLAVULANATE 125 MG TABLET
ORAL_TABLET | Freq: Two times a day (BID) | ORAL | 0 refills | 10 days | Status: CP
Start: 2022-05-05 — End: 2022-05-15

## 2022-05-08 ENCOUNTER — Ambulatory Visit: Admit: 2022-05-08 | Discharge: 2022-05-09 | Payer: MEDICARE

## 2022-05-08 DIAGNOSIS — I447 Left bundle-branch block, unspecified: Secondary | ICD-10-CM | POA: Diagnosis not present

## 2022-05-08 DIAGNOSIS — I5022 Chronic systolic (congestive) heart failure: Secondary | ICD-10-CM | POA: Diagnosis not present

## 2022-05-29 DIAGNOSIS — F259 Schizoaffective disorder, unspecified: Secondary | ICD-10-CM | POA: Diagnosis not present

## 2022-06-15 ENCOUNTER — Other Ambulatory Visit: Payer: Self-pay

## 2022-06-15 ENCOUNTER — Other Ambulatory Visit: Payer: Self-pay | Admitting: Family Medicine

## 2022-06-15 DIAGNOSIS — E1169 Type 2 diabetes mellitus with other specified complication: Secondary | ICD-10-CM

## 2022-06-15 MED ORDER — LOSARTAN POTASSIUM 25 MG PO TABS: 12.5000 mg | ORAL_TABLET | Freq: Every day | ORAL | 1 refills | Status: DC

## 2022-06-15 NOTE — Telephone Encounter (Signed)
Requested Prescriptions  Pending Prescriptions Disp Refills   atorvastatin (LIPITOR) 40 MG tablet [Pharmacy Med Name: ATORVASTATIN 40 MG TABLET] 90 tablet 0    Sig: TAKE 1 TABLET BY MOUTH DAILY     Cardiovascular:  Antilipid - Statins Failed - 06/15/2022  6:22 AM      Failed - Lipid Panel in normal range within the last 12 months    Cholesterol  Date Value Ref Range Status  02/08/2022 185 <200 mg/dL Final   LDL Cholesterol (Calc)  Date Value Ref Range Status  02/08/2022 99 mg/dL (calc) Final    Comment:    Reference range: <100 . Desirable range <100 mg/dL for primary prevention;   <70 mg/dL for patients with CHD or diabetic patients  with > or = 2 CHD risk factors. [name] LDL-C is now calculated using the Martin-Hopkins  calculation, which is a validated novel method providing  better accuracy than the Friedewald equation in the  estimation of LDL-C.  Cresenciano Genre et al. Annamaria Helling. [id number]): 2061-2068  (http://education.[facility].com/faq/FAQ164)    HDL  Date Value Ref Range Status  02/08/2022 50 > OR = 40 mg/dL Final   Triglycerides  Date Value Ref Range Status  02/08/2022 250 (H) <150 mg/dL Final    Comment:    . If a non-fasting specimen was collected, consider repeat triglyceride testing on a fasting specimen if clinically indicated.  Yates Decamp et al. J. of Clin. Lipidol. [id number]. [name]          Passed - Patient is not pregnant      Passed - Valid encounter within last 12 months    Recent Outpatient Visits           2 months ago Yeast UTI   [place], DO   4 months ago Annual physical exam   [facility] Olin [facility], DO   1 year ago Acute HFrEF (heart failure with reduced ejection fraction) [facility])   [place], DO   1 year ago Annual physical exam   [facility] Olin Haus[facility], DO   2 years ago Type 2 diabetes mellitus with other specified complication, without long-term current use of insulin [facility])   [place], [place]

## 2022-07-02 ENCOUNTER — Ambulatory Visit: Admit: 2022-07-02 | Discharge: 2022-07-02 | Payer: MEDICARE

## 2022-07-02 ENCOUNTER — Ambulatory Visit: Admit: 2022-07-02 | Discharge: 2022-07-02 | Payer: MEDICARE | Attending: Adult Health | Primary: Adult Health

## 2022-07-02 DIAGNOSIS — I5022 Chronic systolic (congestive) heart failure: Secondary | ICD-10-CM | POA: Diagnosis not present

## 2022-07-02 DIAGNOSIS — I447 Left bundle-branch block, unspecified: Secondary | ICD-10-CM | POA: Diagnosis not present

## 2022-07-02 DIAGNOSIS — J449 Chronic obstructive pulmonary disease, unspecified: Secondary | ICD-10-CM | POA: Diagnosis not present

## 2022-07-02 MED ORDER — EMPAGLIFLOZIN 10 MG TABLET
ORAL_TABLET | Freq: Every day | ORAL | 1 refills | 90 days | Status: CP
Start: 2022-07-02 — End: ?
  Filled 2022-07-20: qty 90, 90d supply, fill #0

## 2022-07-02 MED ORDER — METOPROLOL SUCCINATE ER 100 MG TABLET,EXTENDED RELEASE 24 HR
ORAL_TABLET | Freq: Every day | ORAL | 3 refills | 90 days | Status: CP
Start: 2022-07-02 — End: ?
  Filled 2022-08-02: qty 90, 90d supply, fill #0

## 2022-07-13 DIAGNOSIS — I5022 Chronic systolic (congestive) heart failure: Principal | ICD-10-CM

## 2022-07-20 DIAGNOSIS — I5022 Chronic systolic (congestive) heart failure: Principal | ICD-10-CM

## 2022-07-24 DIAGNOSIS — J438 Other emphysema: Principal | ICD-10-CM

## 2022-07-26 ENCOUNTER — Ambulatory Visit: Admit: 2022-07-26 | Discharge: 2022-07-27 | Payer: MEDICARE

## 2022-07-26 DIAGNOSIS — J439 Emphysema, unspecified: Secondary | ICD-10-CM | POA: Diagnosis not present

## 2022-07-26 DIAGNOSIS — J438 Other emphysema: Secondary | ICD-10-CM | POA: Diagnosis not present

## 2022-07-26 DIAGNOSIS — R0609 Other forms of dyspnea: Secondary | ICD-10-CM | POA: Diagnosis not present

## 2022-08-01 ENCOUNTER — Ambulatory Visit: Admit: 2022-08-01 | Discharge: 2022-08-02 | Payer: MEDICARE

## 2022-08-01 DIAGNOSIS — R053 Chronic cough: Secondary | ICD-10-CM | POA: Diagnosis not present

## 2022-08-01 DIAGNOSIS — R0600 Dyspnea, unspecified: Secondary | ICD-10-CM | POA: Diagnosis not present

## 2022-08-01 DIAGNOSIS — R1310 Dysphagia, unspecified: Secondary | ICD-10-CM | POA: Diagnosis not present

## 2022-08-01 DIAGNOSIS — C8590 Non-Hodgkin lymphoma, unspecified, unspecified site: Secondary | ICD-10-CM | POA: Diagnosis not present

## 2022-08-01 DIAGNOSIS — R1312 Dysphagia, oropharyngeal phase: Secondary | ICD-10-CM | POA: Diagnosis not present

## 2022-08-01 DIAGNOSIS — J449 Chronic obstructive pulmonary disease, unspecified: Secondary | ICD-10-CM | POA: Diagnosis not present

## 2022-08-01 MED ORDER — BUDESONIDE-FORMOTEROL HFA 160 MCG-4.5 MCG/ACTUATION AEROSOL INHALER
Freq: Two times a day (BID) | RESPIRATORY_TRACT | 3 refills | 31 days | Status: CP
Start: 2022-08-01 — End: 2023-08-01

## 2022-08-02 MED FILL — FUROSEMIDE 40 MG TABLET: ORAL | 90 days supply | Qty: 45 | Fill #1

## 2022-08-09 DIAGNOSIS — J449 Chronic obstructive pulmonary disease, unspecified: Principal | ICD-10-CM

## 2022-08-13 ENCOUNTER — Ambulatory Visit: Admit: 2022-08-13 | Discharge: 2022-08-13 | Payer: MEDICARE

## 2022-08-13 DIAGNOSIS — J449 Chronic obstructive pulmonary disease, unspecified: Secondary | ICD-10-CM | POA: Diagnosis not present

## 2022-08-13 DIAGNOSIS — Z9221 Personal history of antineoplastic chemotherapy: Secondary | ICD-10-CM | POA: Diagnosis not present

## 2022-08-13 DIAGNOSIS — I447 Left bundle-branch block, unspecified: Secondary | ICD-10-CM | POA: Diagnosis not present

## 2022-08-13 DIAGNOSIS — F209 Schizophrenia, unspecified: Secondary | ICD-10-CM | POA: Diagnosis not present

## 2022-08-13 DIAGNOSIS — E119 Type 2 diabetes mellitus without complications: Secondary | ICD-10-CM | POA: Diagnosis not present

## 2022-08-13 DIAGNOSIS — Z8572 Personal history of non-Hodgkin lymphomas: Secondary | ICD-10-CM | POA: Diagnosis not present

## 2022-08-13 DIAGNOSIS — I428 Other cardiomyopathies: Secondary | ICD-10-CM | POA: Diagnosis not present

## 2022-08-13 DIAGNOSIS — I2510 Atherosclerotic heart disease of native coronary artery without angina pectoris: Secondary | ICD-10-CM | POA: Diagnosis not present

## 2022-08-13 DIAGNOSIS — Z923 Personal history of irradiation: Secondary | ICD-10-CM | POA: Diagnosis not present

## 2022-08-13 DIAGNOSIS — J939 Pneumothorax, unspecified: Secondary | ICD-10-CM | POA: Diagnosis not present

## 2022-08-13 DIAGNOSIS — G4733 Obstructive sleep apnea (adult) (pediatric): Secondary | ICD-10-CM | POA: Diagnosis not present

## 2022-08-13 DIAGNOSIS — I5022 Chronic systolic (congestive) heart failure: Secondary | ICD-10-CM | POA: Diagnosis not present

## 2022-08-13 DIAGNOSIS — I272 Pulmonary hypertension, unspecified: Secondary | ICD-10-CM | POA: Diagnosis not present

## 2022-08-13 MED ORDER — OXYCODONE-ACETAMINOPHEN 5 MG-325 MG TABLET
ORAL_TABLET | Freq: Four times a day (QID) | ORAL | 0 refills | 3 days | Status: CP | PRN
Start: 2022-08-13 — End: 2022-08-18

## 2022-08-14 DIAGNOSIS — I447 Left bundle-branch block, unspecified: Secondary | ICD-10-CM | POA: Diagnosis not present

## 2022-08-14 DIAGNOSIS — I5022 Chronic systolic (congestive) heart failure: Secondary | ICD-10-CM | POA: Diagnosis not present

## 2022-08-15 ENCOUNTER — Institutional Professional Consult (permissible substitution): Admit: 2022-08-15 | Discharge: 2022-08-16 | Payer: MEDICARE

## 2022-08-29 ENCOUNTER — Ambulatory Visit: Admit: 2022-08-29 | Discharge: 2022-08-29 | Payer: MEDICARE

## 2022-08-29 DIAGNOSIS — R0602 Shortness of breath: Secondary | ICD-10-CM | POA: Diagnosis not present

## 2022-08-29 DIAGNOSIS — C8590 Non-Hodgkin lymphoma, unspecified, unspecified site: Secondary | ICD-10-CM | POA: Diagnosis not present

## 2022-08-29 DIAGNOSIS — R918 Other nonspecific abnormal finding of lung field: Secondary | ICD-10-CM | POA: Diagnosis not present

## 2022-08-29 DIAGNOSIS — R053 Chronic cough: Secondary | ICD-10-CM | POA: Diagnosis not present

## 2022-09-01 MED ORDER — ARIPIPRAZOLE 10 MG TABLET
ORAL_TABLET | Freq: Every day | ORAL | 3 refills | 60 days
Start: 2022-09-01 — End: 2022-10-01

## 2022-09-01 MED ORDER — FUROSEMIDE 40 MG TABLET
ORAL_TABLET | Freq: Every day | ORAL | 1 refills | 90 days
Start: 2022-09-01 — End: ?

## 2022-09-02 DIAGNOSIS — J449 Chronic obstructive pulmonary disease, unspecified: Principal | ICD-10-CM

## 2022-09-02 MED ORDER — FUROSEMIDE 40 MG TABLET
ORAL_TABLET | Freq: Every day | ORAL | 1 refills | 90.00000 days | Status: CP
Start: 2022-09-02 — End: ?

## 2022-09-03 ENCOUNTER — Encounter: Payer: Self-pay | Admitting: Family Medicine

## 2022-09-03 DIAGNOSIS — E1169 Type 2 diabetes mellitus with other specified complication: Secondary | ICD-10-CM

## 2022-09-03 MED ORDER — METFORMIN HCL ER 500 MG PO TB24: 500.0000 mg | ORAL_TABLET | Freq: Two times a day (BID) | ORAL | 0 refills | Status: DC

## 2022-09-03 MED ORDER — ARIPIPRAZOLE 10 MG TABLET
ORAL_TABLET | Freq: Every day | ORAL | 3 refills | 60 days
Start: 2022-09-03 — End: 2022-10-03

## 2022-09-05 ENCOUNTER — Other Ambulatory Visit: Payer: Self-pay | Admitting: Family Medicine

## 2022-09-05 DIAGNOSIS — E1169 Type 2 diabetes mellitus with other specified complication: Secondary | ICD-10-CM

## 2022-09-16 ENCOUNTER — Other Ambulatory Visit: Payer: Self-pay | Admitting: Family Medicine

## 2022-09-16 DIAGNOSIS — E1169 Type 2 diabetes mellitus with other specified complication: Secondary | ICD-10-CM

## 2022-09-17 MED FILL — SPIRONOLACTONE 25 MG TABLET: ORAL | 90 days supply | Qty: 90 | Fill #1

## 2022-09-17 MED FILL — ENTRESTO 49 MG-51 MG TABLET: ORAL | 30 days supply | Qty: 60 | Fill #1

## 2022-09-18 ENCOUNTER — Telehealth: Payer: Self-pay | Admitting: Family Medicine

## 2022-09-18 DIAGNOSIS — E1169 Type 2 diabetes mellitus with other specified complication: Secondary | ICD-10-CM

## 2022-09-18 MED ORDER — METFORMIN HCL ER 500 MG PO TB24: 500.0000 mg | ORAL_TABLET | Freq: Two times a day (BID) | ORAL | 0 refills | Status: DC

## 2022-09-18 NOTE — Telephone Encounter (Signed)
Unable to refill per protocol, Rx request is too soon. Last refill 09/03/22 for 30 days. Patient needs OV for additional refills.  Requested Prescriptions  Pending Prescriptions Disp Refills   metFORMIN (GLUCOPHAGE-XR) 500 MG 24 hr tablet [Pharmacy Med Name: METFORMIN HCL ER 500 MG TABLET] 180 tablet 1    Sig: TAKE 1 TABLET BY MOUTH TWICE A DAY WITH MEAL NEED OFFICE VISIT     Endocrinology:  Diabetes - Biguanides Failed - 09/16/2022  3:51 PM      Failed - HBA1C is between 0 and 7.9 and within 180 days    Hgb A1c MFr Bld  Date Value Ref Range Status  02/08/2022 5.9 (H) <5.7 % of total Hgb Final    Comment:    For someone without known diabetes, a hemoglobin  A1c value between 5.7% and 6.4% is consistent with prediabetes and should be confirmed with a  follow-up test. . For someone with known diabetes, a value <7% indicates that their diabetes is well controlled. A1c targets should be individualized based on duration of diabetes, age, comorbid conditions, and other considerations. . This assay result is consistent with an increased risk of diabetes. . Currently, no consensus exists regarding use of hemoglobin A1c for diagnosis of diabetes for children. .          Failed - B12 Level in normal range and within 720 days    No results found for: "VITAMINB12"       Failed - CBC within normal limits and completed in the last 12 months    WBC  Date Value Ref Range Status  10/16/2021 5.7 4.0 - 10.5 K/uL Final   RBC  Date Value Ref Range Status  10/16/2021 4.40 4.22 - 5.81 MIL/uL Final   Hemoglobin  Date Value Ref Range Status  10/16/2021 12.7 (L) 13.0 - 17.0 g/dL Final   HGB  Date Value Ref Range Status  04/15/2014 14.6 13.0 - 18.0 g/dL Final   HCT  Date Value Ref Range Status  10/16/2021 38.5 (L) 39.0 - 52.0 % Final  04/15/2014 43.8 40.0 - 52.0 % Final   MCHC  Date Value Ref Range Status  10/16/2021 33.0 30.0 - 36.0 g/dL Final   [facility]  Date Value Ref Range Status   10/16/2021 28.9 26.0 - 34.0 pg Final   MCV  Date Value Ref Range Status  10/16/2021 87.5 80.0 - 100.0 fL Final  04/15/2014 87 80 - 100 fL Final   No results found for: "PLTCOUNTKUC", "LABPLAT", "POCPLA" RDW  Date Value Ref Range Status  10/16/2021 14.3 11.5 - 15.5 % Final  04/15/2014 15.0 (H) 11.5 - 14.5 % Final         Passed - Cr in normal range and within 360 days    Creat  Date Value Ref Range Status  02/08/2022 0.90 0.70 - 1.30 mg/dL Final   Creatinine, Urine  Date Value Ref Range Status  02/08/2022 147 20 - 320 mg/dL Final         Passed - eGFR in normal range and within 360 days    GFR, Est African American  Date Value Ref Range Status  12/28/2019 99 > OR = 60 mL/min/1.19m2 Final   GFR, Est Non African American  Date Value Ref Range Status  12/28/2019 85 > OR = 60 mL/min/1.79m2 Final   GFR, Estimated  Date Value Ref Range Status  10/16/2021 >60 >60 mL/min Final    Comment:    (NOTE) Calculated using the CKD-EPI Creatinine Equation (  2021)    eGFR  Date Value Ref Range Status  02/08/2022 103 > OR = 60 mL/min/1.43m2 Final         Passed - Valid encounter within last 6 months    Recent Outpatient Visits           6 months ago Yeast UTI   [facility] [place], Netta Ne[place], DO   7 months ago Annual physical exam   [facility] Smitty Cords, DO   1 year ago Acute HFrEF (heart failure with reduced ejection fraction) [facility])   [facility] Smitty Cords, DO   1 year ago Annual physical exam   [facility] Smitty Cords, DO   2 years ago Type 2 diabetes mellitus with other specified complication, without long-term current use of insulin [facility])    [facility], Netta Neat, [place]

## 2022-09-18 NOTE — Telephone Encounter (Signed)
Randy [name] patients wife called requesting to get the refill for metFORMIN (GLUCOPHAGE-XR) 500 MG 24 hr tablet for patient as she is unable to bring patient right now because her mother just passed away and she is not doing good. Patient has an appointment scheduled for 7/5. She is requesting enough to get him to his appt. Please contact patient.

## 2022-09-21 ENCOUNTER — Institutional Professional Consult (permissible substitution): Admit: 2022-09-21 | Discharge: 2022-09-22 | Payer: MEDICARE

## 2022-09-21 DIAGNOSIS — Z95810 Presence of automatic (implantable) cardiac defibrillator: Principal | ICD-10-CM

## 2022-09-28 ENCOUNTER — Ambulatory Visit: Payer: Medicare HMO | Admitting: Family Medicine

## 2022-10-01 ENCOUNTER — Emergency Department
Admit: 2022-10-01 | Discharge: 2022-10-01 | Disposition: A | Discharge status: Home / Self Care | Payer: MEDICARE | Attending: Emergency Medicine

## 2022-10-01 ENCOUNTER — Ambulatory Visit
Admit: 2022-10-01 | Discharge: 2022-10-01 | Disposition: A | Discharge status: Home / Self Care | Payer: MEDICARE | Attending: Emergency Medicine

## 2022-10-01 ENCOUNTER — Ambulatory Visit: Admit: 2022-10-01 | Discharge: 2022-10-02 | Payer: MEDICARE | Attending: Adult Health | Primary: Adult Health

## 2022-10-01 DIAGNOSIS — J449 Chronic obstructive pulmonary disease, unspecified: Secondary | ICD-10-CM | POA: Diagnosis not present

## 2022-10-01 DIAGNOSIS — B379 Candidiasis, unspecified: Secondary | ICD-10-CM | POA: Diagnosis not present

## 2022-10-01 DIAGNOSIS — I2510 Atherosclerotic heart disease of native coronary artery without angina pectoris: Secondary | ICD-10-CM | POA: Diagnosis not present

## 2022-10-01 DIAGNOSIS — E119 Type 2 diabetes mellitus without complications: Secondary | ICD-10-CM | POA: Diagnosis not present

## 2022-10-01 DIAGNOSIS — R112 Nausea with vomiting, unspecified: Secondary | ICD-10-CM | POA: Diagnosis not present

## 2022-10-01 DIAGNOSIS — I5022 Chronic systolic (congestive) heart failure: Secondary | ICD-10-CM | POA: Diagnosis not present

## 2022-10-01 DIAGNOSIS — G4733 Obstructive sleep apnea (adult) (pediatric): Secondary | ICD-10-CM | POA: Diagnosis not present

## 2022-10-01 DIAGNOSIS — F319 Bipolar disorder, unspecified: Secondary | ICD-10-CM | POA: Diagnosis not present

## 2022-10-01 DIAGNOSIS — F209 Schizophrenia, unspecified: Secondary | ICD-10-CM | POA: Diagnosis not present

## 2022-10-01 DIAGNOSIS — R0789 Other chest pain: Secondary | ICD-10-CM | POA: Diagnosis not present

## 2022-10-01 DIAGNOSIS — R300 Dysuria: Secondary | ICD-10-CM | POA: Diagnosis not present

## 2022-10-01 DIAGNOSIS — Z95810 Presence of automatic (implantable) cardiac defibrillator: Secondary | ICD-10-CM | POA: Diagnosis not present

## 2022-10-01 DIAGNOSIS — I509 Heart failure, unspecified: Secondary | ICD-10-CM | POA: Diagnosis not present

## 2022-10-01 DIAGNOSIS — R5381 Other malaise: Secondary | ICD-10-CM | POA: Diagnosis not present

## 2022-10-01 DIAGNOSIS — R079 Chest pain, unspecified: Secondary | ICD-10-CM | POA: Diagnosis not present

## 2022-10-01 DIAGNOSIS — C8590 Non-Hodgkin lymphoma, unspecified, unspecified site: Secondary | ICD-10-CM | POA: Diagnosis not present

## 2022-10-01 MED ORDER — FUROSEMIDE 40 MG TABLET
ORAL_TABLET | Freq: Every day | ORAL | 1 refills | 90 days | Status: CP
Start: 2022-10-01 — End: ?

## 2022-10-03 ENCOUNTER — Encounter: Payer: Self-pay | Admitting: Family Medicine

## 2022-10-03 ENCOUNTER — Ambulatory Visit: Payer: Medicare HMO | Admitting: Family Medicine

## 2022-10-03 ENCOUNTER — Telehealth: Payer: Self-pay

## 2022-10-03 VITALS — BP 134/72 | HR 79 | Temp 99.1°F | Resp 18 | Ht 71.0 in | Wt 232.6 lb

## 2022-10-03 DIAGNOSIS — K635 Polyp of colon: Secondary | ICD-10-CM

## 2022-10-03 DIAGNOSIS — Z1211 Encounter for screening for malignant neoplasm of colon: Secondary | ICD-10-CM | POA: Diagnosis not present

## 2022-10-03 DIAGNOSIS — E785 Hyperlipidemia, unspecified: Secondary | ICD-10-CM | POA: Diagnosis not present

## 2022-10-03 DIAGNOSIS — E1169 Type 2 diabetes mellitus with other specified complication: Secondary | ICD-10-CM | POA: Diagnosis not present

## 2022-10-03 DIAGNOSIS — I2510 Atherosclerotic heart disease of native coronary artery without angina pectoris: Secondary | ICD-10-CM

## 2022-10-03 MED ORDER — LOSARTAN POTASSIUM 25 MG PO TABS
12.5000 mg | ORAL_TABLET | Freq: Every day | ORAL | 3 refills | Status: DC
Start: 2022-10-03 — End: 2023-03-18

## 2022-10-03 MED ORDER — ATORVASTATIN CALCIUM 40 MG PO TABS
40.0000 mg | ORAL_TABLET | Freq: Every day | ORAL | 3 refills | Status: DC
Start: 2022-10-03 — End: 2023-11-19

## 2022-10-03 MED ORDER — METFORMIN HCL ER 500 MG PO TB24
500.0000 mg | ORAL_TABLET | Freq: Two times a day (BID) | ORAL | 3 refills | Status: DC
Start: 2022-10-03 — End: 2023-09-13

## 2022-10-03 MED ORDER — METOPROLOL SUCCINATE ER 25 MG PO TB24
12.5000 mg | ORAL_TABLET | Freq: Every day | ORAL | 3 refills | Status: DC
Start: 2022-10-03 — End: 2023-11-19

## 2022-10-03 NOTE — Progress Notes (Signed)
Subjective:    Patient ID: Randy [name], male    DOB: March 22, 1971, 52 y.o.   MRN: [id number]  Randy [name] is a 52 y.o. male presenting on 10/03/2022 for Diabetes   HPI  Schizophrenia / Major Depression recurrent Followed by Psychiatry [facility] - Today he is reporting overall doing well, On current medications - Abilify 10mg  x 2 = 20mg  daily, Fluoxetine 10mg  daily, Depakote 250 + 500mg  nightly, Mirtazapine 7.5mg  TWICE A DAY    Type 2 Diabetes / Morbid Obesity BMI Due for A1c Meds:Metformin XR 500mg  BID admits some GI intolerance Considering injection therapy Lifestyle: Improved diet - Exercise (Walking regularly for activity and exercise) [facility], need upcoming report. Denies hypoglycemia, polyuria, visual changes, numbness or tingling  History of cancer He has reduced tobacco / chew   Health Maintenance: Last Colonoscopy 2019, advised repeat in 1 year, now 4 years late the has Colonoscopy scheduled for July 2024 Due for GI referral now today.  Shingles shot available. X 2 dose at pharmacy if interested.     10/03/2022    2:27 PM 03/21/2022   10:39 AM 02/08/2022    8:32 AM  Depression screen PHQ 2/9  Decreased Interest 0 1 1  Down, Depressed, Hopeless 0 3 1  PHQ - 2 Score 0 4 2  Altered sleeping 0 1 1  Tired, decreased energy 0 3 2  Change in appetite 0 1 1  Feeling bad or failure about yourself  0 2 1  Trouble concentrating 0 1 2  Moving slowly or fidgety/restless 1 1 1   Suicidal thoughts 0 1 1  PHQ-9 Score 1 14 11   Difficult doing work/chores Not difficult at all Somewhat difficult Somewhat difficult    Social History   Tobacco Use   Smoking status: Former    Packs/day: 1.50    Years: 5.00    Additional pack years: 0.00    Total pack years: 7.50    Types: Cigarettes    Quit date: 03/26/2001    Years since quitting: 21.5   Smokeless tobacco: Current    Types: Chew  Vaping Use   Vaping Use: Former  Substance Use Topics    Alcohol use: No   Drug use: Never    Review of Systems Per HPI unless specifically indicated above     Objective:    BP 134/72 (BP Location: Right Arm, Patient Position: Sitting, Cuff Size: Large)   Pulse 79   Temp 99.1 F (37.3 C) (Oral)   Resp 18   Ht 5\' 11"  (1.803 m)   Wt 232 lb 9.6 oz (105.5 kg)   SpO2 98%   BMI 32.44 kg/m   Wt Readings from Last 3 Encounters:  10/03/22 232 lb 9.6 oz (105.5 kg)  02/08/22 241 lb (109.3 kg)  10/16/21 244 lb 11.4 oz (111 kg)    Physical Exam Vitals and nursing note reviewed.  Constitutional:      General: He is not in acute distress.    Appearance: He is well-developed. He is not diaphoretic.     Comments: Well-appearing, comfortable, cooperative  HENT:     Head: Normocephalic and atraumatic.  Eyes:     General:        Right eye: No discharge.        Left eye: No discharge.     Conjunctiva/sclera: Conjunctivae normal.  Neck:     Thyroid: No thyromegaly.  Cardiovascular:     Rate and Rhythm: Normal rate and regular  rhythm.     Pulses: Normal pulses.     Heart sounds: Normal heart sounds. No murmur heard. Pulmonary:     Effort: Pulmonary effort is normal. No respiratory distress.     Breath sounds: Normal breath sounds. No wheezing or rales.  Musculoskeletal:        General: Normal range of motion.     Cervical back: Normal range of motion and neck supple.  Lymphadenopathy:     Cervical: No cervical adenopathy.  Skin:    General: Skin is warm and dry.     Findings: No erythema or rash.  Neurological:     Mental Status: He is alert and oriented to person, place, and time. Mental status is at baseline.  Psychiatric:        Behavior: Behavior normal.     Comments: Well groomed, good eye contact, normal speech and thoughts      Results for orders placed or performed in visit on 03/21/22  Urine Culture   Specimen: Urine  Result Value Ref Range   MICRO NUMBER: [id number]    SPECIMEN QUALITY: Adequate    Sample Source  URINE, CLEAN CATCH    STATUS: FINAL    Result: No Growth   Urinalysis, Routine w reflex microscopic  Result Value Ref Range   Color, Urine YELLOW YELLOW   APPearance CLEAR CLEAR   Specific Gravity, Urine 1.007 1.001 - 1.035   pH 5.5 5.0 - 8.0   Glucose, UA NEGATIVE NEGATIVE   Bilirubin Urine NEGATIVE NEGATIVE   Ketones, ur NEGATIVE NEGATIVE   Hgb urine dipstick NEGATIVE NEGATIVE   Protein, ur NEGATIVE NEGATIVE   Nitrite NEGATIVE NEGATIVE   Leukocytes,Ua NEGATIVE NEGATIVE      Assessment & Plan:   Problem List Items Addressed This Visit     CAD in native artery   Relevant Medications   atorvastatin (LIPITOR) 40 MG tablet   losartan (COZAAR) 25 MG tablet   metoprolol succinate (TOPROL-XL) 25 MG 24 hr tablet   Hyperlipidemia associated with type 2 diabetes mellitus (HCC)   Relevant Medications   atorvastatin (LIPITOR) 40 MG tablet   losartan (COZAAR) 25 MG tablet   metoprolol succinate (TOPROL-XL) 25 MG 24 hr tablet   metFORMIN (GLUCOPHAGE-XR) 500 MG 24 hr tablet   Type 2 diabetes mellitus with other specified complication (HCC) - Primary   Relevant Medications   atorvastatin (LIPITOR) 40 MG tablet   losartan (COZAAR) 25 MG tablet   metFORMIN (GLUCOPHAGE-XR) 500 MG 24 hr tablet   Other Relevant Orders   Hemoglobin A1c   Other Visit Diagnoses     Screening for colon cancer       Relevant Orders   Ambulatory referral to Gastroenterology   Polyp of colon, unspecified part of colon, unspecified type       Relevant Orders   Ambulatory referral to Gastroenterology       A1c lab today for blood sugar. Pending result.  For now we can keep Metformin XR 500mg  TWICE A DAY dosing  The Ozempic was not covered.  We may be able to get [facility] approved in the future (weekly injection) but cost may still be higher overall.  -------------------  Medication refills today - Metformin - Atorvastatin - Metoprolol XL - Losartan  Referral for Colonoscopy - last done 2019  multiple polyps at [facility].   Switched to [facility]  --------  Medications that we believe are ordered by other doctors - Abilify 10mg  x 2 = 20mg  daily - Fluoxetine  10mg  daily - Depakote 250 + 500mg   - Mirtazapine  [facility] - Jardiance 10mg  daily - Furosemide 40mg  daily AS NEEDED for fluid  Orders Placed This Encounter  Procedures   Hemoglobin A1c   Ambulatory referral to Gastroenterology    Referral Priority:   Routine    Referral Type:   Consultation    Referral Reason:   Specialty Services Required    Number of Visits Requested:   1      Meds ordered this encounter  Medications   atorvastatin (LIPITOR) 40 MG tablet    Sig: Take 1 tablet (40 mg total) by mouth at bedtime.    Dispense:  90 tablet    Refill:  3   losartan (COZAAR) 25 MG tablet    Sig: Take 0.5 tablets (12.5 mg total) by mouth daily.    Dispense:  45 tablet    Refill:  3    Add future refills   metoprolol succinate (TOPROL-XL) 25 MG 24 hr tablet    Sig: Take 0.5 tablets (12.5 mg total) by mouth daily.    Dispense:  45 tablet    Refill:  3   metFORMIN (GLUCOPHAGE-XR) 500 MG 24 hr tablet    Sig: Take 1 tablet (500 mg total) by mouth 2 (two) times daily with a meal.    Dispense:  180 tablet    Refill:  3    Add future refills      Follow up plan: Return in about 6 months (around 04/05/2023) for 6 month fasting lab only then 1 week later Annual Physical.   Saralyn Pilar, DO [facility] 10/03/2022, 2:36 PM

## 2022-10-03 NOTE — Telephone Encounter (Signed)
Copied from CRM [id number]. Topic: Referral - Request for Referral >> Oct 03, 2022  3:29 PM Alfred Levins wrote: Randy [name] states that [facility] does not take his insurance. Can you send a referral to [facility]?

## 2022-10-03 NOTE — Patient Instructions (Addendum)
Thank you for coming to the office today.  A1c lab today for blood sugar.  For now we can keep Metformin. Since it is a low to moderate dose  The Ozempic was not covered.  We may be able to get [facility] approved in the future (weekly injection) but cost may still be higher overall.  -------------------  Medication refills today - Metformin - Atorvastatin - Metoprolol XL - Losartan  Referral for Colonoscopy - last done 2019 multiple polyps at [facility].   [facility]) [address] - [address] [place], [place] [address] Phone: [phone]  --------  Medications that we believe are ordered by other doctors - Abilify 10mg  x 2 = 20mg  daily - Fluoxetine 10mg  daily - Depakote 250 + 500mg   - Mirtazapine  [facility] - Jardiance 10mg  daily - Furosemide 40mg  daily AS NEEDED for fluid  DUE for FASTING BLOOD WORK (no food or drink after midnight before the lab appointment, only water or coffee without cream/sugar on the morning of)  SCHEDULE "Lab Only" visit in the morning at the clinic for lab draw in 6 MONTHS   - Make sure Lab Only appointment is at about 1 week before your next appointment, so that results will be available  For Lab Results, once available within 2-3 days of blood draw, you can can log in to MyChart online to view your results and a brief explanation. Also, we can discuss results at next follow-up visit.    Please schedule a Follow-up Appointment to: Return in about 6 months (around 04/05/2023) for 6 month fasting lab only then 1 week later Annual Physical.  If you have any other questions or concerns, please feel free to call the office or send a message through MyChart. You may also schedule an earlier appointment if necessary.  Additionally, you may be receiving a survey about your experience at our office within a few days to 1 week by e-mail or mail. We value your feedback.  Saralyn Pilar, DO [facility] [place], [place]

## 2022-10-03 NOTE — Telephone Encounter (Signed)
Randy [name], I have submitted new referral for GI for Colonoscopy for [facility] due to his insurance issue  Saralyn Pilar, DO [facility] 10/03/2022, 5:28 PM

## 2022-10-04 ENCOUNTER — Encounter: Payer: Self-pay | Admitting: Family Medicine

## 2022-10-04 LAB — HEMOGLOBIN A1C
Hgb A1c MFr Bld: 5.7 % of total Hgb — ABNORMAL HIGH (ref <5.7)
Mean Plasma Glucose: 117 mg/dL
eAG (mmol/L): 6.5 mmol/L

## 2022-10-10 DIAGNOSIS — K635 Polyp of colon: Principal | ICD-10-CM

## 2022-10-10 DIAGNOSIS — Z1211 Encounter for screening for malignant neoplasm of colon: Principal | ICD-10-CM

## 2022-10-23 ENCOUNTER — Encounter: Admit: 2022-10-23 | Discharge: 2022-10-23 | Payer: MEDICARE | Attending: Internal Medicine | Primary: Internal Medicine

## 2022-10-23 DIAGNOSIS — Z95810 Presence of automatic (implantable) cardiac defibrillator: Secondary | ICD-10-CM | POA: Diagnosis not present

## 2022-10-23 DIAGNOSIS — I509 Heart failure, unspecified: Secondary | ICD-10-CM | POA: Diagnosis not present

## 2022-10-24 ENCOUNTER — Ambulatory Visit: Admit: 2022-10-24 | Discharge: 2022-10-25 | Payer: MEDICARE | Attending: Medical Oncology | Primary: Medical Oncology

## 2022-10-24 ENCOUNTER — Encounter: Admit: 2022-10-24 | Discharge: 2022-10-25 | Payer: MEDICARE | Attending: Internal Medicine | Primary: Internal Medicine

## 2022-10-24 ENCOUNTER — Other Ambulatory Visit: Admit: 2022-10-24 | Discharge: 2022-10-25 | Payer: MEDICARE

## 2022-10-24 DIAGNOSIS — E119 Type 2 diabetes mellitus without complications: Secondary | ICD-10-CM | POA: Diagnosis not present

## 2022-10-24 DIAGNOSIS — Z79899 Other long term (current) drug therapy: Secondary | ICD-10-CM | POA: Diagnosis not present

## 2022-10-24 DIAGNOSIS — I959 Hypotension, unspecified: Secondary | ICD-10-CM | POA: Diagnosis not present

## 2022-10-24 DIAGNOSIS — Z7951 Long term (current) use of inhaled steroids: Secondary | ICD-10-CM | POA: Diagnosis not present

## 2022-10-24 DIAGNOSIS — I447 Left bundle-branch block, unspecified: Secondary | ICD-10-CM | POA: Diagnosis not present

## 2022-10-24 DIAGNOSIS — C8190 Hodgkin lymphoma, unspecified, unspecified site: Secondary | ICD-10-CM | POA: Diagnosis not present

## 2022-10-24 DIAGNOSIS — J449 Chronic obstructive pulmonary disease, unspecified: Secondary | ICD-10-CM | POA: Diagnosis not present

## 2022-10-24 DIAGNOSIS — F319 Bipolar disorder, unspecified: Secondary | ICD-10-CM | POA: Diagnosis not present

## 2022-10-24 DIAGNOSIS — Z7985 Long-term (current) use of injectable non-insulin antidiabetic drugs: Secondary | ICD-10-CM | POA: Diagnosis not present

## 2022-10-24 DIAGNOSIS — I5022 Chronic systolic (congestive) heart failure: Secondary | ICD-10-CM | POA: Diagnosis not present

## 2022-10-24 DIAGNOSIS — Z8571 Personal history of Hodgkin lymphoma: Secondary | ICD-10-CM | POA: Diagnosis not present

## 2022-10-24 DIAGNOSIS — Z95810 Presence of automatic (implantable) cardiac defibrillator: Secondary | ICD-10-CM | POA: Diagnosis not present

## 2022-10-24 DIAGNOSIS — C8590 Non-Hodgkin lymphoma, unspecified, unspecified site: Principal | ICD-10-CM

## 2022-11-13 MED FILL — ENTRESTO 49 MG-51 MG TABLET: ORAL | 30 days supply | Qty: 60 | Fill #2

## 2022-11-13 MED FILL — METOPROLOL SUCCINATE ER 100 MG TABLET,EXTENDED RELEASE 24 HR: ORAL | 90 days supply | Qty: 90 | Fill #1

## 2022-11-16 ENCOUNTER — Institutional Professional Consult (permissible substitution): Admit: 2022-11-16 | Discharge: 2022-11-17 | Payer: MEDICARE | Attending: Internal Medicine | Primary: Internal Medicine

## 2022-11-16 DIAGNOSIS — R0789 Other chest pain: Secondary | ICD-10-CM | POA: Diagnosis not present

## 2022-11-16 DIAGNOSIS — I5022 Chronic systolic (congestive) heart failure: Secondary | ICD-10-CM | POA: Diagnosis not present

## 2022-11-16 DIAGNOSIS — Z4502 Encounter for adjustment and management of automatic implantable cardiac defibrillator: Secondary | ICD-10-CM | POA: Diagnosis not present

## 2022-11-16 DIAGNOSIS — R9431 Abnormal electrocardiogram [ECG] [EKG]: Secondary | ICD-10-CM | POA: Diagnosis not present

## 2022-11-23 DIAGNOSIS — Z79899 Other long term (current) drug therapy: Secondary | ICD-10-CM | POA: Diagnosis not present

## 2022-11-23 DIAGNOSIS — F250 Schizoaffective disorder, bipolar type: Secondary | ICD-10-CM | POA: Diagnosis not present

## 2022-11-23 DIAGNOSIS — G252 Other specified forms of tremor: Secondary | ICD-10-CM | POA: Diagnosis not present

## 2022-11-30 ENCOUNTER — Other Ambulatory Visit: Payer: Self-pay | Admitting: Family Medicine

## 2022-11-30 DIAGNOSIS — E1169 Type 2 diabetes mellitus with other specified complication: Secondary | ICD-10-CM

## 2022-12-05 ENCOUNTER — Telehealth: Payer: Self-pay | Admitting: Family Medicine

## 2022-12-05 NOTE — Telephone Encounter (Signed)
LM 12/05/2022 to schedule AWV   Randy [name]; Care Guide Ambulatory Clinical Support [place] [facility] Direct Dial: [phone]

## 2022-12-12 ENCOUNTER — Ambulatory Visit: Admit: 2022-12-12 | Discharge: 2022-12-14 | Payer: MEDICARE

## 2022-12-12 ENCOUNTER — Encounter: Payer: Self-pay | Admitting: Family Medicine

## 2022-12-12 ENCOUNTER — Ambulatory Visit (INDEPENDENT_AMBULATORY_CARE_PROVIDER_SITE_OTHER): Payer: Medicare HMO | Admitting: Family Medicine

## 2022-12-12 VITALS — BP 126/70 | HR 70 | Temp 96.8°F | Ht 71.0 in | Wt 226.0 lb

## 2022-12-12 DIAGNOSIS — R7989 Other specified abnormal findings of blood chemistry: Secondary | ICD-10-CM

## 2022-12-12 DIAGNOSIS — G4733 Obstructive sleep apnea (adult) (pediatric): Secondary | ICD-10-CM | POA: Diagnosis not present

## 2022-12-12 DIAGNOSIS — I428 Other cardiomyopathies: Secondary | ICD-10-CM

## 2022-12-12 DIAGNOSIS — G479 Sleep disorder, unspecified: Secondary | ICD-10-CM | POA: Diagnosis not present

## 2022-12-12 DIAGNOSIS — I5022 Chronic systolic (congestive) heart failure: Secondary | ICD-10-CM | POA: Insufficient documentation

## 2022-12-12 NOTE — Patient Instructions (Addendum)
Thank you for coming to the office today.  Keep on Furosemide HALF dose = 20mg , it half of the 40mg  tablet  The kidney function has improved on last lab back in August.   No repeat today  Increase water intake to help maintain kidney function.  Avoid sodas  Recommend scheduling Colonoscopy w/ [facility] as planned.   Please schedule a Follow-up Appointment to: Return if symptoms worsen or fail to improve.  If you have any other questions or concerns, please feel free to call the office or send a message through MyChart. You may also schedule an earlier appointment if necessary.  Additionally, you may be receiving a survey about your experience at our office within a few days to 1 week by e-mail or mail. We value your feedback.  Saralyn Pilar, DO [facility], [place]

## 2022-12-12 NOTE — Progress Notes (Signed)
Subjective:    Patient ID: Randy [name], male    DOB: 1970-07-02, 52 y.o.   MRN: [id number]  Randy [name] is a 52 y.o. male presenting on 12/12/2022 for Hypertension   HPI  Discussed the use of AI scribe software for clinical note transcription with the patient, who gave verbal consent to proceed.  History of Present Illness   The patient presented with concerns about recent blood test results, specifically elevated kidney function markers. He noted a recent test showed a high Glomerular Filtration Rate (GFR) and creatinine levels, which he was unsure of the implications. He also mentioned a previous test where the results were normal, causing confusion about the fluctuating results.  The patient is on Lasix 40 mg daily per Cardiology (CHF Heart Failure Team at [facility]) for heart-related fluid management, but recently had the dosage reduced to half (20mg  dose) due to adverse effects, including fainting. He reported no current issues with fluid retention or swelling. He also mentioned significant weight loss recently, which was attributed to fluid weight.  The patient has a history of heart troubles, including a pacemaker defibrillator implant. He reported feeling well after the implant and has more energy. He also mentioned a history of colon polyps, which were removed, and a need for a follow-up colonoscopy.       Last lab 10/24/22 - Creatinine 1.45 and eGFR 58 Other lab results from [facility] showed normal range Creatinine and eGFR, last one was 11/16/22 - Creatinine 1.16 and eGFR 75      10/03/2022    2:27 PM 03/21/2022   10:39 AM 02/08/2022    8:32 AM  Depression screen PHQ 2/9  Decreased Interest 0 1 1  Down, Depressed, Hopeless 0 3 1  PHQ - 2 Score 0 4 2  Altered sleeping 0 1 1  Tired, decreased energy 0 3 2  Change in appetite 0 1 1  Feeling bad or failure about yourself  0 2 1  Trouble concentrating 0 1 2  Moving slowly or fidgety/restless 1 1 1   Suicidal thoughts 0 1 1  PHQ-9  Score 1 14 11   Difficult doing work/chores Not difficult at all Somewhat difficult Somewhat difficult    Social History   Tobacco Use   Smoking status: Former    Current packs/day: 0.00    Average packs/day: 1.5 packs/day for 5.0 years (7.5 ttl pk-yrs)    Types: Cigarettes    Start date: 03/26/1996    Quit date: 03/26/2001    Years since quitting: 21.7   Smokeless tobacco: Current    Types: Chew  Vaping Use   Vaping status: Former  Substance Use Topics   Alcohol use: No   Drug use: Never    Review of Systems Per HPI unless specifically indicated above     Objective:    BP 126/70 (BP Location: Left Arm, Patient Position: Sitting, Cuff Size: Normal)   Pulse 70   Temp (!) 96.8 F (36 C) (Temporal)   Ht 5\' 11"  (1.803 m)   Wt 226 lb (102.5 kg)   SpO2 96%   BMI 31.52 kg/m   Wt Readings from Last 3 Encounters:  12/12/22 226 lb (102.5 kg)  10/03/22 232 lb 9.6 oz (105.5 kg)  02/08/22 241 lb (109.3 kg)    Physical Exam Vitals and nursing note reviewed.  Constitutional:      General: He is not in acute distress.    Appearance: Normal appearance. He is well-developed. He is not diaphoretic.  Comments: Well-appearing, comfortable, cooperative  HENT:     Head: Normocephalic and atraumatic.  Eyes:     General:        Right eye: No discharge.        Left eye: No discharge.     Conjunctiva/sclera: Conjunctivae normal.  Cardiovascular:     Rate and Rhythm: Normal rate.  Pulmonary:     Effort: Pulmonary effort is normal.  Skin:    General: Skin is warm and dry.     Findings: No erythema or rash.  Neurological:     Mental Status: He is alert and oriented to person, place, and time.  Psychiatric:        Mood and Affect: Mood normal.        Behavior: Behavior normal.        Thought Content: Thought content normal.     Comments: Well groomed, good eye contact, normal speech and thoughts       Results for orders placed or performed in visit on 10/03/22  Hemoglobin  A1c  Result Value Ref Range   Hgb A1c MFr Bld 5.7 (H) <5.7 % of total Hgb   Mean Plasma Glucose 117 mg/dL   eAG (mmol/L) 6.5 mmol/L      Assessment & Plan:   Problem List Items Addressed This Visit     Chronic systolic congestive heart failure (HCC) - Primary   Nonischemic cardiomyopathy (HCC)   Other Visit Diagnoses     Elevated serum creatinine           Assessment and Plan    Elevated Creatinine. Not consistent with CKD Recent lab work showed elevated creatinine and decreased GFR, likely due to dehydration and Lasix use. Subsequent lab work showed improvement in kidney function. -Continue Lasix at 20mg  daily (half of 40mg  tablet) - per Cardiology, may discuss this with them in future if warrants AS NEEDED dosing instead. -Increase water intake to maintain kidney function. -Continue monitoring kidney function with regular lab work. Already has scheduled per [facility]  -Monitor for any signs of fluid overload.  Colonoscopy Follow-up Previous abnormal colonoscopy with polyps. Referral to [facility] sent in July of this year, but patient has not yet scheduled due to personal circumstances. -Recommend scheduling colonoscopy with [facility]. They will call when ready.   No orders of the defined types were placed in this encounter.     Follow up plan: Return if symptoms worsen or fail to improve.   Saralyn Pilar, DO [facility] 12/12/2022, 11:10 AM

## 2022-12-21 DIAGNOSIS — G252 Other specified forms of tremor: Secondary | ICD-10-CM | POA: Diagnosis not present

## 2022-12-21 DIAGNOSIS — F250 Schizoaffective disorder, bipolar type: Secondary | ICD-10-CM | POA: Diagnosis not present

## 2022-12-21 DIAGNOSIS — Z79899 Other long term (current) drug therapy: Secondary | ICD-10-CM | POA: Diagnosis not present

## 2022-12-23 ENCOUNTER — Ambulatory Visit
Admit: 2022-12-23 | Discharge: 2022-12-24 | Payer: MEDICARE | Attending: Student in an Organized Health Care Education/Training Program | Primary: Student in an Organized Health Care Education/Training Program

## 2022-12-23 DIAGNOSIS — T23152A Burn of first degree of left palm, initial encounter: Secondary | ICD-10-CM | POA: Diagnosis not present

## 2022-12-23 MED ORDER — BACITRACIN ZINC 500 UNIT/GRAM TOPICAL OINTMENT
0 refills | 0 days | Status: CP
Start: 2022-12-23 — End: 2023-12-23

## 2023-01-01 ENCOUNTER — Telehealth: Payer: Self-pay | Admitting: Family Medicine

## 2023-01-01 NOTE — Telephone Encounter (Signed)
Copied from CRM [id number]. Topic: Medicare AWV >> Jan 01, 2023 11:18 AM Payton Doughty wrote: Reason for CRM: Called LVM 01/01/2023 to schedule AWV   Verlee Rossetti; Care Guide Ambulatory Clinical Support [place] [facility] Direct Dial: [phone]

## 2023-01-22 ENCOUNTER — Encounter: Admit: 2023-01-22 | Discharge: 2023-01-22 | Attending: Internal Medicine | Primary: Internal Medicine

## 2023-01-22 DIAGNOSIS — I509 Heart failure, unspecified: Secondary | ICD-10-CM | POA: Diagnosis not present

## 2023-01-22 DIAGNOSIS — Z95810 Presence of automatic (implantable) cardiac defibrillator: Secondary | ICD-10-CM | POA: Diagnosis not present

## 2023-01-23 ENCOUNTER — Encounter: Admit: 2023-01-23 | Discharge: 2023-01-23 | Attending: Internal Medicine | Primary: Internal Medicine

## 2023-01-23 DIAGNOSIS — Z95810 Presence of automatic (implantable) cardiac defibrillator: Secondary | ICD-10-CM | POA: Diagnosis not present

## 2023-01-23 DIAGNOSIS — Z45018 Encounter for adjustment and management of other part of cardiac pacemaker: Secondary | ICD-10-CM | POA: Diagnosis not present

## 2023-01-23 DIAGNOSIS — I447 Left bundle-branch block, unspecified: Secondary | ICD-10-CM | POA: Diagnosis not present

## 2023-01-31 DIAGNOSIS — J441 Chronic obstructive pulmonary disease with (acute) exacerbation: Secondary | ICD-10-CM | POA: Diagnosis not present

## 2023-01-31 DIAGNOSIS — Z03818 Encounter for observation for suspected exposure to other biological agents ruled out: Secondary | ICD-10-CM | POA: Diagnosis not present

## 2023-01-31 MED ORDER — SPIRONOLACTONE 25 MG TABLET
ORAL_TABLET | Freq: Every day | ORAL | 0 refills | 30 days | Status: CP
Start: 2023-01-31 — End: ?

## 2023-02-13 MED ORDER — SPIRONOLACTONE 25 MG TABLET
ORAL_TABLET | Freq: Every day | ORAL | 1 refills | 0 days
Start: 2023-02-13 — End: ?

## 2023-02-14 MED ORDER — SPIRONOLACTONE 25 MG TABLET
ORAL_TABLET | Freq: Every day | ORAL | 1 refills | 90 days | Status: CP
Start: 2023-02-14 — End: ?

## 2023-03-06 MED FILL — ENTRESTO 49 MG-51 MG TABLET: ORAL | 30 days supply | Qty: 60 | Fill #3

## 2023-03-06 MED FILL — METOPROLOL SUCCINATE ER 100 MG TABLET,EXTENDED RELEASE 24 HR: ORAL | 90 days supply | Qty: 90 | Fill #2

## 2023-03-08 DIAGNOSIS — Z79899 Other long term (current) drug therapy: Secondary | ICD-10-CM | POA: Diagnosis not present

## 2023-03-17 ENCOUNTER — Other Ambulatory Visit: Payer: Self-pay | Admitting: Family Medicine

## 2023-03-17 DIAGNOSIS — E1169 Type 2 diabetes mellitus with other specified complication: Secondary | ICD-10-CM

## 2023-03-18 ENCOUNTER — Other Ambulatory Visit: Payer: Self-pay

## 2023-03-18 DIAGNOSIS — E1169 Type 2 diabetes mellitus with other specified complication: Secondary | ICD-10-CM

## 2023-03-18 MED ORDER — LOSARTAN POTASSIUM 25 MG PO TABS: 12.5000 mg | ORAL_TABLET | Freq: Every day | ORAL | 3 refills | Status: AC

## 2023-03-19 DIAGNOSIS — G252 Other specified forms of tremor: Secondary | ICD-10-CM | POA: Diagnosis not present

## 2023-03-19 DIAGNOSIS — Z79899 Other long term (current) drug therapy: Secondary | ICD-10-CM | POA: Diagnosis not present

## 2023-03-19 DIAGNOSIS — F250 Schizoaffective disorder, bipolar type: Secondary | ICD-10-CM | POA: Diagnosis not present

## 2023-03-19 NOTE — Telephone Encounter (Signed)
Requested Prescriptions  Pending Prescriptions Disp Refills   losartan (COZAAR) 25 MG tablet [Pharmacy Med Name: LOSARTAN POTASSIUM 25 MG TAB] 45 tablet 3    Sig: TAKE 1/2 TABLET BY MOUTH DAILY     Cardiovascular:  Angiotensin Receptor Blockers Failed - 03/19/2023 10:36 AM      Failed - Cr in normal range and within 180 days    Creat  Date Value Ref Range Status  02/08/2022 0.90 0.70 - 1.30 mg/dL Final   Creatinine, Urine  Date Value Ref Range Status  02/08/2022 147 20 - 320 mg/dL Final         Failed - K in normal range and within 180 days    Potassium  Date Value Ref Range Status  02/08/2022 4.3 3.5 - 5.3 mmol/L Final  04/15/2014 3.6 3.5 - 5.1 mmol/L Final         Passed - Patient is not pregnant      Passed - Last BP in normal range    BP Readings from Last 1 Encounters:  12/12/22 126/70         Passed - Valid encounter within last 6 months    Recent Outpatient Visits           3 months ago Chronic systolic congestive heart failure [facility])   [place] [facility] Smitty Cords, DO   5 months ago Type 2 diabetes mellitus with other specified complication, without long-term current use of insulin [facility])   [facility] Smitty Cords, DO   12 months ago Yeast UTI   [place] [facility] Althea Charon, Netta Neat, DO   1 year ago Annual physical exam   [facility] Smitty Cords, DO   1 year ago Acute HFrEF (heart failure with reduced ejection fraction) [facility])   [place] [facility] Althea Charon, Netta Neat, DO       Future Appointments             In 2 weeks Althea Charon, Netta Neat, DO [facility], [facility]

## 2023-04-01 ENCOUNTER — Ambulatory Visit: Admit: 2023-04-01 | Discharge: 2023-04-02 | Attending: Adult Health | Primary: Adult Health

## 2023-04-01 DIAGNOSIS — I5022 Chronic systolic (congestive) heart failure: Secondary | ICD-10-CM | POA: Diagnosis not present

## 2023-04-01 DIAGNOSIS — G4733 Obstructive sleep apnea (adult) (pediatric): Secondary | ICD-10-CM | POA: Diagnosis not present

## 2023-04-01 DIAGNOSIS — I2510 Atherosclerotic heart disease of native coronary artery without angina pectoris: Secondary | ICD-10-CM | POA: Diagnosis not present

## 2023-04-01 DIAGNOSIS — Z23 Encounter for immunization: Secondary | ICD-10-CM | POA: Diagnosis not present

## 2023-04-01 MED FILL — ENTRESTO 49 MG-51 MG TABLET: ORAL | 30 days supply | Qty: 60 | Fill #4

## 2023-04-04 DIAGNOSIS — J449 Chronic obstructive pulmonary disease, unspecified: Principal | ICD-10-CM

## 2023-04-04 DIAGNOSIS — I5022 Chronic systolic (congestive) heart failure: Principal | ICD-10-CM

## 2023-04-04 MED ORDER — FUROSEMIDE 20 MG TABLET
ORAL_TABLET | Freq: Every day | ORAL | 3 refills | 90.00 days | Status: CP
Start: 2023-04-04 — End: 2023-04-04

## 2023-04-04 MED ORDER — ATORVASTATIN 40 MG TABLET
ORAL_TABLET | Freq: Every day | ORAL | 3 refills | 90 days | Status: CP
Start: 2023-04-04 — End: 2023-04-04

## 2023-04-04 MED ORDER — SACUBITRIL 49 MG-VALSARTAN 51 MG TABLET
ORAL_TABLET | Freq: Two times a day (BID) | ORAL | 11 refills | 30.00 days | Status: CP
Start: 2023-04-04 — End: 2023-04-04

## 2023-04-05 ENCOUNTER — Encounter: Payer: Medicare HMO | Admitting: Family Medicine

## 2023-04-08 ENCOUNTER — Encounter: Payer: Self-pay | Admitting: Family Medicine

## 2023-04-08 ENCOUNTER — Ambulatory Visit (INDEPENDENT_AMBULATORY_CARE_PROVIDER_SITE_OTHER): Payer: Medicare HMO | Admitting: Family Medicine

## 2023-04-08 VITALS — BP 122/78 | HR 63 | Ht 71.0 in | Wt 239.0 lb

## 2023-04-08 DIAGNOSIS — I5022 Chronic systolic (congestive) heart failure: Secondary | ICD-10-CM | POA: Diagnosis not present

## 2023-04-08 DIAGNOSIS — E785 Hyperlipidemia, unspecified: Secondary | ICD-10-CM

## 2023-04-08 DIAGNOSIS — J432 Centrilobular emphysema: Secondary | ICD-10-CM | POA: Diagnosis not present

## 2023-04-08 DIAGNOSIS — Z1211 Encounter for screening for malignant neoplasm of colon: Secondary | ICD-10-CM | POA: Diagnosis not present

## 2023-04-08 DIAGNOSIS — I428 Other cardiomyopathies: Secondary | ICD-10-CM

## 2023-04-08 DIAGNOSIS — E1169 Type 2 diabetes mellitus with other specified complication: Secondary | ICD-10-CM | POA: Diagnosis not present

## 2023-04-08 DIAGNOSIS — Z0000 Encounter for general adult medical examination without abnormal findings: Secondary | ICD-10-CM

## 2023-04-08 DIAGNOSIS — Z125 Encounter for screening for malignant neoplasm of prostate: Secondary | ICD-10-CM

## 2023-04-08 DIAGNOSIS — I2510 Atherosclerotic heart disease of native coronary artery without angina pectoris: Secondary | ICD-10-CM | POA: Diagnosis not present

## 2023-04-08 DIAGNOSIS — F200 Paranoid schizophrenia: Secondary | ICD-10-CM

## 2023-04-08 NOTE — Patient Instructions (Addendum)
 Thank you for coming to the office today.  Refilled medications.  Labs today and Urine Test for kidney function.  Referral back to [facility] for Colonoscopy screening. Last done January 2019. Due every 5 years.  They will call you with apt. If not heard back yet, you can contact them.  Drucie Sandrea Quant, MD  [address]  [id number]  [place], [place] [address]  [phone] (Work)  [phone] (Fax)    Please schedule a Follow-up Appointment to: Return in about 6 months (around 10/06/2023) for 6 month DM A1c.  If you have any other questions or concerns, please feel free to call the office or send a message through MyChart. You may also schedule an earlier appointment if necessary.  Additionally, you may be receiving a survey about your experience at our office within a few days to 1 week by e-mail or mail. We value your feedback.  Marsa Officer, DO [facility], [place]

## 2023-04-08 NOTE — Progress Notes (Signed)
 Subjective:    Patient ID: Randy [name], male    DOB: [birth year], 53 y.o.   MRN: [id number]  Randy [name] is a 53 y.o. male presenting on 04/08/2023 for Annual Exam and Diabetes   HPI  Discussed the use of AI scribe software for clinical note transcription with the patient, who gave verbal consent to proceed.  History of Present Illness    The patient presented for a routine annual checkup. He reported no new or unusual symptoms or health concerns. He had recent blood work done at [facility], with only a few tests still needing to be drawn that were not checked by their office, including A1c, metabolic, and prostate tests.  The patient had received his flu shot and had an eye exam within the past year, although the exact date was not recalled. He was due for a colon screening, which was last performed in 2019. He is due for repeat Colonoscopy now.  The patient's blood pressure was well-controlled, and he was generally in good health.   Schizophrenia / Major Depression recurrent Followed by [facility] for specialized Schizophrenia patients, chart reviewed from last visit, documentation shows patient has history of fetal alcohol syndrome, recurrent major depression and schizophrenia.  - Today he is reporting overall doing well, there was some discussion about adjusting his medications recently based on chart review. On Abilify , Depakote , FLuoxetine , Mirtazapine    Type 2 Diabetes / Morbid Obesity BMI Anti-psychotic/schizophrenia medication causing hyperglycemia Meds:Metformin  XR 500mg  BID admits some GI intolerance Lifestyle: Improved diet - Exercise (Walking regularly for activity and exercise) [facility], need upcoming report. Denies hypoglycemia, polyuria, visual changes, numbness or tingling   History of cancer He has reduced tobacco / chew   Health Maintenance: Last Colonoscopy 2019, advised repeat in 1 year, now 4 years late the has Colonoscopy scheduled for July  2024       04/08/2023    8:34 AM 12/12/2022    3:04 PM 10/03/2022    2:27 PM  Depression screen PHQ 2/9  Decreased Interest 0 0 0  Down, Depressed, Hopeless 2 0 0  PHQ - 2 Score 2 0 0  Altered sleeping 1 0 0  Tired, decreased energy 1 0 0  Change in appetite 0 0 0  Feeling bad or failure about yourself  1 0 0  Trouble concentrating 1 0 0  Moving slowly or fidgety/restless 0 0 1  Suicidal thoughts 0 0 0  PHQ-9 Score 6 0 1  Difficult doing work/chores  Not difficult at all Not difficult at all       04/08/2023    8:34 AM 12/12/2022    3:05 PM 10/03/2022    2:27 PM 03/21/2022   10:40 AM  GAD 7 : Generalized Anxiety Score  Nervous, Anxious, on Edge 1 0 0 1  Control/stop worrying 1 0 0 1  Worry too much - different things 1 0 1 1  Trouble relaxing 1 0 1 2  Restless 1 0 0 1  Easily annoyed or irritable 1 0 0 1  Afraid - awful might happen 2 0 0 1  Total GAD 7 Score 8 0 2 8  Anxiety Difficulty Somewhat difficult Not difficult at all Not difficult at all Not difficult at all     Past Medical History:  Diagnosis Date   Allergy    Bipolar 1 disorder (HCC)    COPD (chronic obstructive pulmonary disease) (HCC)    GERD (gastroesophageal reflux disease)  LBBB (left bundle branch block)    Nonischemic cardiomyopathy (HCC) 05/30/2021   LVEF 35-40%; mild, non-obstructive CAD by LHC.   Past Surgical History:  Procedure Laterality Date   RIGHT/LEFT HEART CATH AND CORONARY ANGIOGRAPHY N/A 05/31/2021   Procedure: RIGHT/LEFT HEART CATH AND CORONARY ANGIOGRAPHY;  Surgeon: Mady Bruckner, MD;  Location: [facility] INVASIVE CV LAB;  Service: Cardiovascular;  Laterality: N/A;   Social History   Socioeconomic History   Marital status: Married    Spouse name: Randy [name]   Number of children: 3   Years of education: Not on file   Highest education level: 11th grade  Occupational History   Occupation: disability   Tobacco Use   Smoking status: Former    Current packs/day: 0.00    Average  packs/day: 1.5 packs/day for 5.0 years (7.5 ttl pk-yrs)    Types: Cigarettes    Start date: 03/26/1996    Quit date: 03/26/2001    Years since quitting: 22.0   Smokeless tobacco: Current    Types: Chew  Vaping Use   Vaping status: Former  Substance and Sexual Activity   Alcohol use: No   Drug use: Never   Sexual activity: Yes  Other Topics Concern   Not on file  Social History Narrative   Ongoing financial constraints mentioned during phone call today   Social Drivers of Health   Financial Resource Strain: Low Risk  (01/31/2023)   Received from [facility]   Overall Financial Resource Strain (CARDIA)    Difficulty of Paying Living Expenses: Not hard at all  Food Insecurity: No Food Insecurity (01/31/2023)   Received from [facility]   Hunger Vital Sign    Worried About Running Out of Food in the Last Year: Never true    Ran Out of Food in the Last Year: Never true  Transportation Needs: No Transportation Needs (01/31/2023)   Received [facility] - Transportation    In the past 12 months, has lack of transportation kept you from medical appointments or from getting medications?: No    Lack of Transportation (Non-Medical): No  Physical Activity: Insufficiently Active (09/29/2022)   Exercise Vital Sign    Days of Exercise per Week: 1 day    Minutes of Exercise per Session: 10 min  Stress: No Stress Concern Present (09/29/2022)   [employer] of Occupational Health - Occupational Stress Questionnaire    Feeling of Stress : Only a little  Social Connections: Moderately Integrated (09/29/2022)   Social Connection and Isolation Panel [NHANES]    Frequency of Communication with Friends and Family: More than three times a week    Frequency of Social Gatherings with Friends and Family: More than three times a week    Attends Religious Services: More than 4 times per year    Active Member of [employer] or Organizations: Yes     Attends Banker Meetings: 1 to 4 times per year    Marital Status: Separated  Intimate Partner Violence: Not At Risk (09/28/2021)   Humiliation, Afraid, Rape, and Kick questionnaire    Fear of Current or Ex-Partner: No    Emotionally Abused: No    Physically Abused: No    Sexually Abused: No   Family History  Problem Relation Age of Onset   Alcohol abuse Mother    Cancer Father        colon    Colon cancer Father    Current Outpatient Medications  on File Prior to Visit  Medication Sig   albuterol  (PROAIR  HFA) 108 (90 Base) MCG/ACT inhaler Inhale 1-2 puffs into the lungs every 6 (six) hours as needed for wheezing or shortness of breath.   ARIPiprazole  (ABILIFY ) 10 MG tablet Take 2 tablets (20 mg total) by mouth daily.   atorvastatin  (LIPITOR) 40 MG tablet Take 1 tablet (40 mg total) by mouth at bedtime.   cetirizine  (ZYRTEC ) 10 MG tablet Take 10 mg by mouth daily as needed for allergies.   divalproex  (DEPAKOTE ) 250 MG DR tablet Take 250 mg by mouth at bedtime. + 500mg  = 750mg  nightly, [facility]   divalproex  (DEPAKOTE ) 500 MG DR tablet 500 mg. + 250mg  dose nightly = 750mg    furosemide  (LASIX ) 40 MG tablet Take 1 tablet by mouth daily.   JARDIANCE 10 MG TABS tablet Take 10 mg by mouth daily.   losartan  (COZAAR ) 25 MG tablet Take 0.5 tablets (12.5 mg total) by mouth daily.   metFORMIN  (GLUCOPHAGE -XR) 500 MG 24 hr tablet Take 1 tablet (500 mg total) by mouth 2 (two) times daily with a meal.   metoprolol  succinate (TOPROL -XL) 25 MG 24 hr tablet Take 0.5 tablets (12.5 mg total) by mouth daily.   mirtazapine (REMERON) 15 MG tablet TAKE 1/2 TABLET (7.5mg ) BY MOUTH each night BEFORE bed.   FLUoxetine  (PROZAC ) 10 MG capsule Take 1 capsule (10 mg total) by mouth daily.   No current facility-administered medications on file prior to visit.    Review of Systems  Constitutional:  Negative for activity change, appetite change, chills, diaphoresis, fatigue and fever.  HENT:   Negative for congestion and hearing loss.   Eyes:  Negative for visual disturbance.  Respiratory:  Negative for cough, chest tightness, shortness of breath and wheezing.   Cardiovascular:  Negative for chest pain, palpitations and leg swelling.  Gastrointestinal:  Negative for abdominal pain, constipation, diarrhea, nausea and vomiting.  Genitourinary:  Negative for dysuria, frequency and hematuria.  Musculoskeletal:  Negative for arthralgias and neck pain.  Skin:  Negative for rash.  Neurological:  Negative for dizziness, weakness, light-headedness, numbness and headaches.  Hematological:  Negative for adenopathy.  Psychiatric/Behavioral:  Negative for behavioral problems, dysphoric mood and sleep disturbance.    Per HPI unless specifically indicated above     Objective:    BP 122/78   Pulse 63   Ht 5' 11 (1.803 m)   Wt 239 lb (108.4 kg)   SpO2 98%   BMI 33.33 kg/m   Wt Readings from Last 3 Encounters:  04/08/23 239 lb (108.4 kg)  12/12/22 226 lb (102.5 kg)  10/03/22 232 lb 9.6 oz (105.5 kg)    Physical Exam Vitals and nursing note reviewed.  Constitutional:      General: He is not in acute distress.    Appearance: He is well-developed. He is not diaphoretic.     Comments: Well-appearing, comfortable, cooperative  HENT:     Head: Normocephalic and atraumatic.  Eyes:     General:        Right eye: No discharge.        Left eye: No discharge.     Conjunctiva/sclera: Conjunctivae normal.     Pupils: Pupils are equal, round, and reactive to light.  Neck:     Thyroid: No thyromegaly.     Vascular: No carotid bruit.  Cardiovascular:     Rate and Rhythm: Normal rate and regular rhythm.     Pulses: Normal pulses.     Heart  sounds: Normal heart sounds. No murmur heard. Pulmonary:     Effort: Pulmonary effort is normal. No respiratory distress.     Breath sounds: Normal breath sounds. No wheezing or rales.  Abdominal:     General: Bowel sounds are normal. There is no  distension.     Palpations: Abdomen is soft. There is no mass.     Tenderness: There is no abdominal tenderness.  Musculoskeletal:        General: No tenderness. Normal range of motion.     Cervical back: Normal range of motion and neck supple.     Right lower leg: No edema.     Left lower leg: No edema.     Comments: Upper / Lower Extremities: - Normal muscle tone, strength bilateral upper extremities 5/5, lower extremities 5/5  Lymphadenopathy:     Cervical: No cervical adenopathy.  Skin:    General: Skin is warm and dry.     Findings: No erythema or rash.  Neurological:     Mental Status: He is alert and oriented to person, place, and time.     Comments: Distal sensation intact to light touch all extremities  Psychiatric:        Mood and Affect: Mood normal.        Behavior: Behavior normal.        Thought Content: Thought content normal.     Comments: Well groomed, good eye contact, normal speech and thoughts     Diabetic Foot Exam - Simple   Simple Foot Form Diabetic Foot exam was performed with the following findings: Yes 04/08/2023  9:05 AM  Visual Inspection See comments: Yes Sensation Testing Intact to touch and monofilament testing bilaterally: Yes Pulse Check Posterior Tibialis and Dorsalis pulse intact bilaterally: Yes Comments Pes cavus high arches, mild callus bilateral great toe and MTP, no ulceration. Intact monofilament.      Results for orders placed or performed in visit on 10/03/22  Hemoglobin A1c   Collection Time: 10/03/22  3:08 PM  Result Value Ref Range   Hgb A1c MFr Bld 5.7 (H) <5.7 % of total Hgb   Mean Plasma Glucose 117 mg/dL   eAG (mmol/L) 6.5 mmol/L      Assessment & Plan:   Problem List Items Addressed This Visit     CAD in native artery   Relevant Orders   COMPLETE METABOLIC PANEL WITH GFR   Centrilobular emphysema (HCC)   Chronic systolic congestive heart failure (HCC)   Hyperlipidemia associated with type 2 diabetes mellitus  (HCC)   Relevant Orders   COMPLETE METABOLIC PANEL WITH GFR   Nonischemic cardiomyopathy (HCC)   Schizophrenia (HCC)   Type 2 diabetes mellitus with other specified complication (HCC)   Relevant Orders   Hemoglobin A1c   Microalbumin / creatinine urine ratio   Other Visit Diagnoses       Annual physical exam    -  Primary   Relevant Orders   Hemoglobin A1c   COMPLETE METABOLIC PANEL WITH GFR   PSA     Screening for prostate cancer       Relevant Orders   PSA     Screening for colon cancer       Relevant Orders   Ambulatory referral to Gastroenterology        Updated Health Maintenance information Fasting labs ordered. Encouraged improvement to lifestyle with diet and exercise Goal of weight loss   Annual Checkup Routine visit for annual checkup. No new or unusual  symptoms reported. Blood pressure within normal range. -Order A1c, metabolic panel, and prostate blood tests. -Collect urine sample for testing. -Refill medications as needed.  Colon Screening Last colonoscopy was in 2019. Patient was scheduled for a colonoscopy in summer 2024 but it did not occur. No reported bowel or colon issues. -Refer to [facility] for colonoscopy. If no response, consider local GI referral.  Diabetic Foot Exam performed today No reported numbness, tingling, burning, or pain. High arch observed with some callus formation. No reported discomfort. -Continue regular foot care.  General Health Maintenance -Continue regular eye exams. -Consider shingles vaccination at pharmacy.  Follow-up in July 2025 q 6 month        Orders Placed This Encounter  Procedures   Hemoglobin A1c   COMPLETE METABOLIC PANEL WITH GFR   PSA   Microalbumin / creatinine urine ratio   Ambulatory referral to Gastroenterology    Referral Priority:   Routine    Referral Type:   Consultation    Referral Reason:   Specialty Services Required    Number of Visits Requested:   1    No orders of the defined types  were placed in this encounter.    Follow up plan: Return in about 6 months (around 10/06/2023) for 6 month DM A1c.  Marsa Officer, DO [facility] 04/08/2023, 8:57 AM

## 2023-04-09 ENCOUNTER — Inpatient Hospital Stay: Admit: 2023-04-09 | Discharge: 2023-04-10

## 2023-04-09 DIAGNOSIS — I5022 Chronic systolic (congestive) heart failure: Secondary | ICD-10-CM | POA: Diagnosis not present

## 2023-04-09 LAB — COMPLETE METABOLIC PANEL WITH GFR
AG Ratio: 1.7 (calc) (ref 1.0–2.5)
ALT: 23 U/L (ref 9–46)
AST: 17 U/L (ref 10–35)
Albumin: 4.7 g/dL (ref 3.6–5.1)
Alkaline phosphatase (APISO): 56 U/L (ref 35–144)
BUN: 21 mg/dL (ref 7–25)
CO2: 27 mmol/L (ref 20–32)
Calcium: 10 mg/dL (ref 8.6–10.3)
Chloride: 103 mmol/L (ref 98–110)
Creat: 0.96 mg/dL (ref 0.70–1.30)
Globulin: 2.7 g/dL (ref 1.9–3.7)
Glucose, Bld: 107 mg/dL — ABNORMAL HIGH (ref 65–99)
Potassium: 4.9 mmol/L (ref 3.5–5.3)
Sodium: 139 mmol/L (ref 135–146)
Total Bilirubin: 0.5 mg/dL (ref 0.2–1.2)
Total Protein: 7.4 g/dL (ref 6.1–8.1)
eGFR: 95 mL/min/{1.73_m2} (ref >=60)

## 2023-04-09 LAB — HEMOGLOBIN A1C
Hgb A1c MFr Bld: 5.9 %{Hb} — ABNORMAL HIGH (ref <5.7)
Mean Plasma Glucose: 123 mg/dL
eAG (mmol/L): 6.8 mmol/L

## 2023-04-09 LAB — PSA: PSA: 0.79 ng/mL (ref <=4.00)

## 2023-04-09 LAB — MICROALBUMIN / CREATININE URINE RATIO
Creatinine, Urine: 112 mg/dL (ref 20–320)
Microalb Creat Ratio: 3 mg/g{creat} (ref <30)
Microalb, Ur: 0.3 mg/dL

## 2023-04-15 IMAGING — CR DG CHEST 2V
2 series · 2 of 2 positions shown · non-contrast
Comparison: 10/13/2020

CLINICAL DATA: Chest pain

EXAM:
CHEST - 2 VIEW

[chest pa]
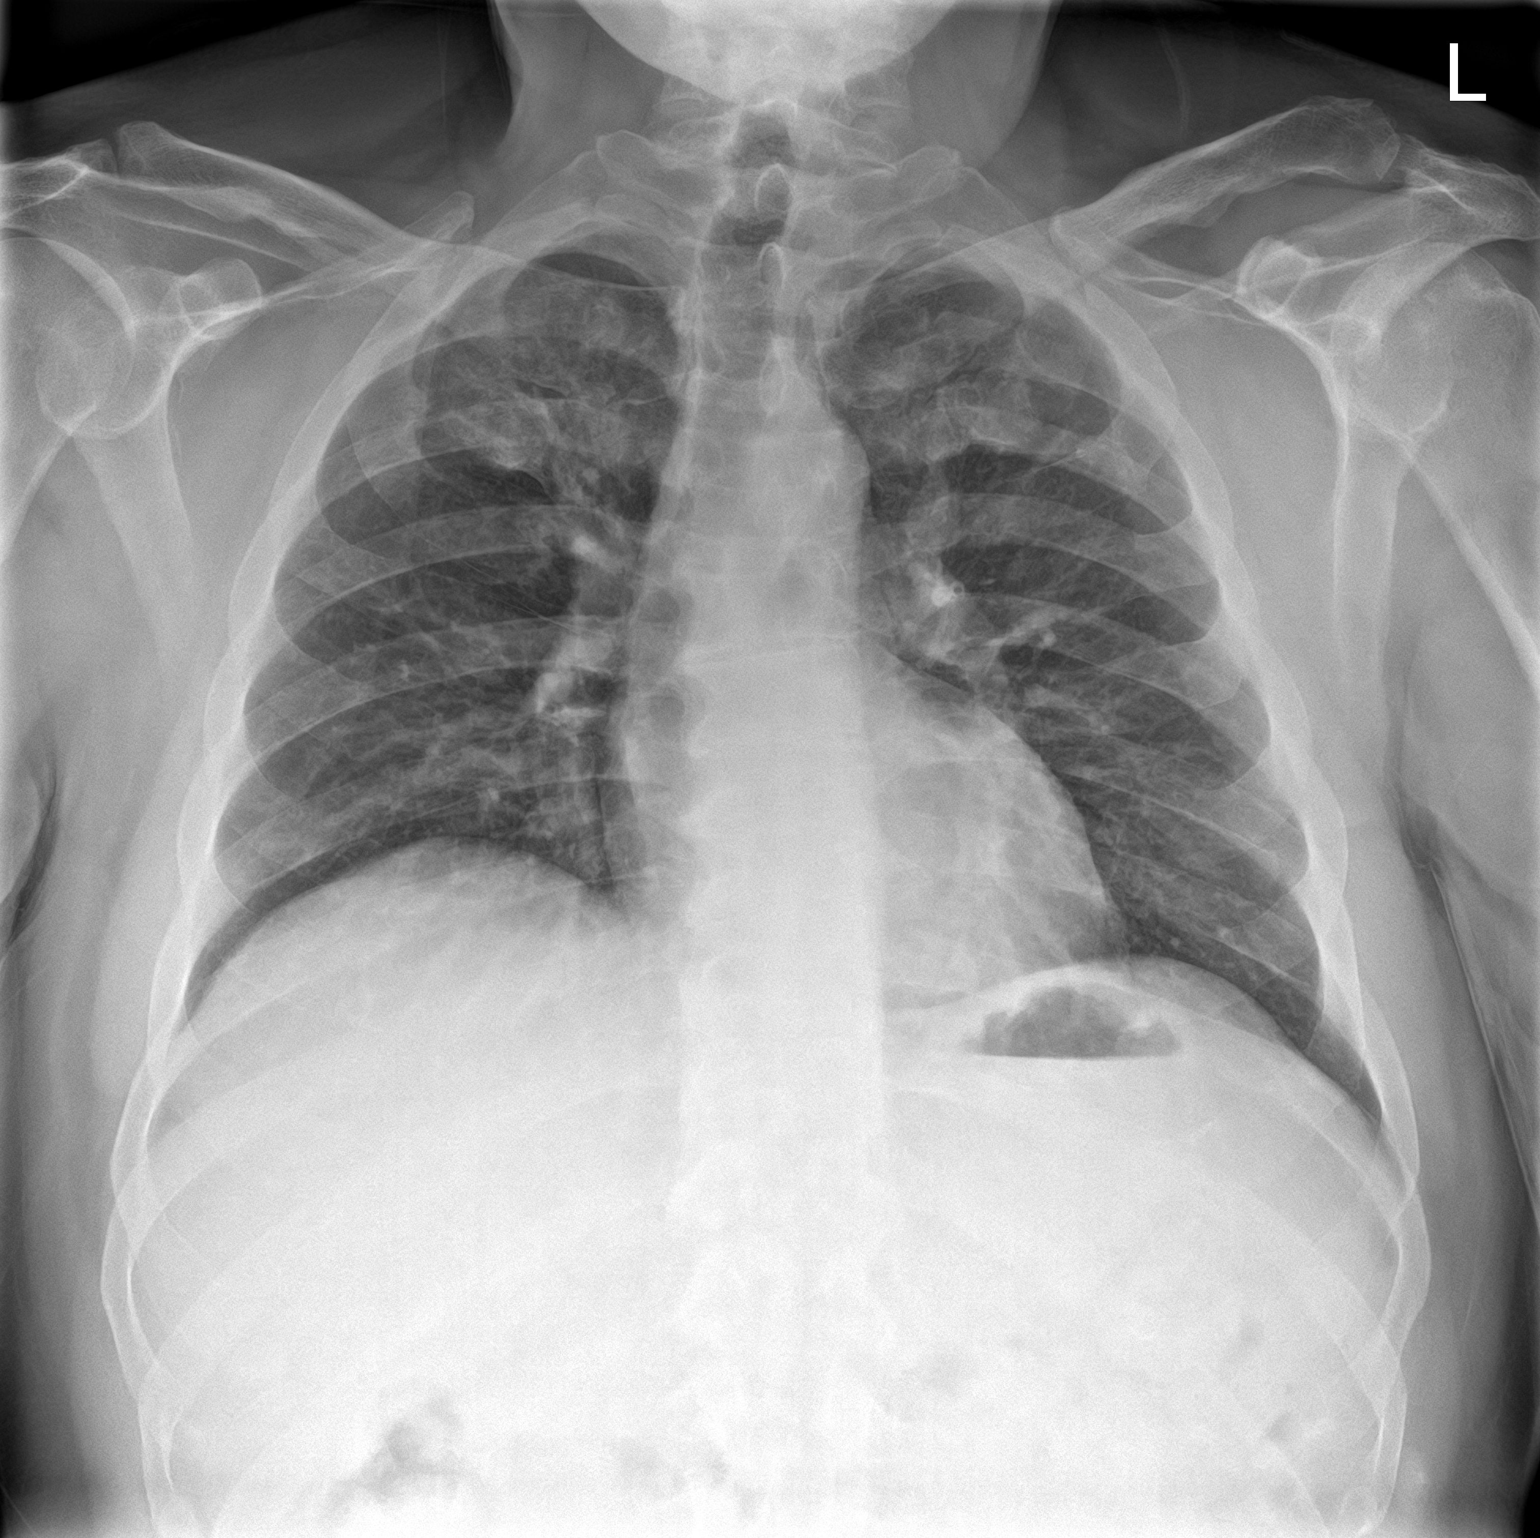

[chest lat]
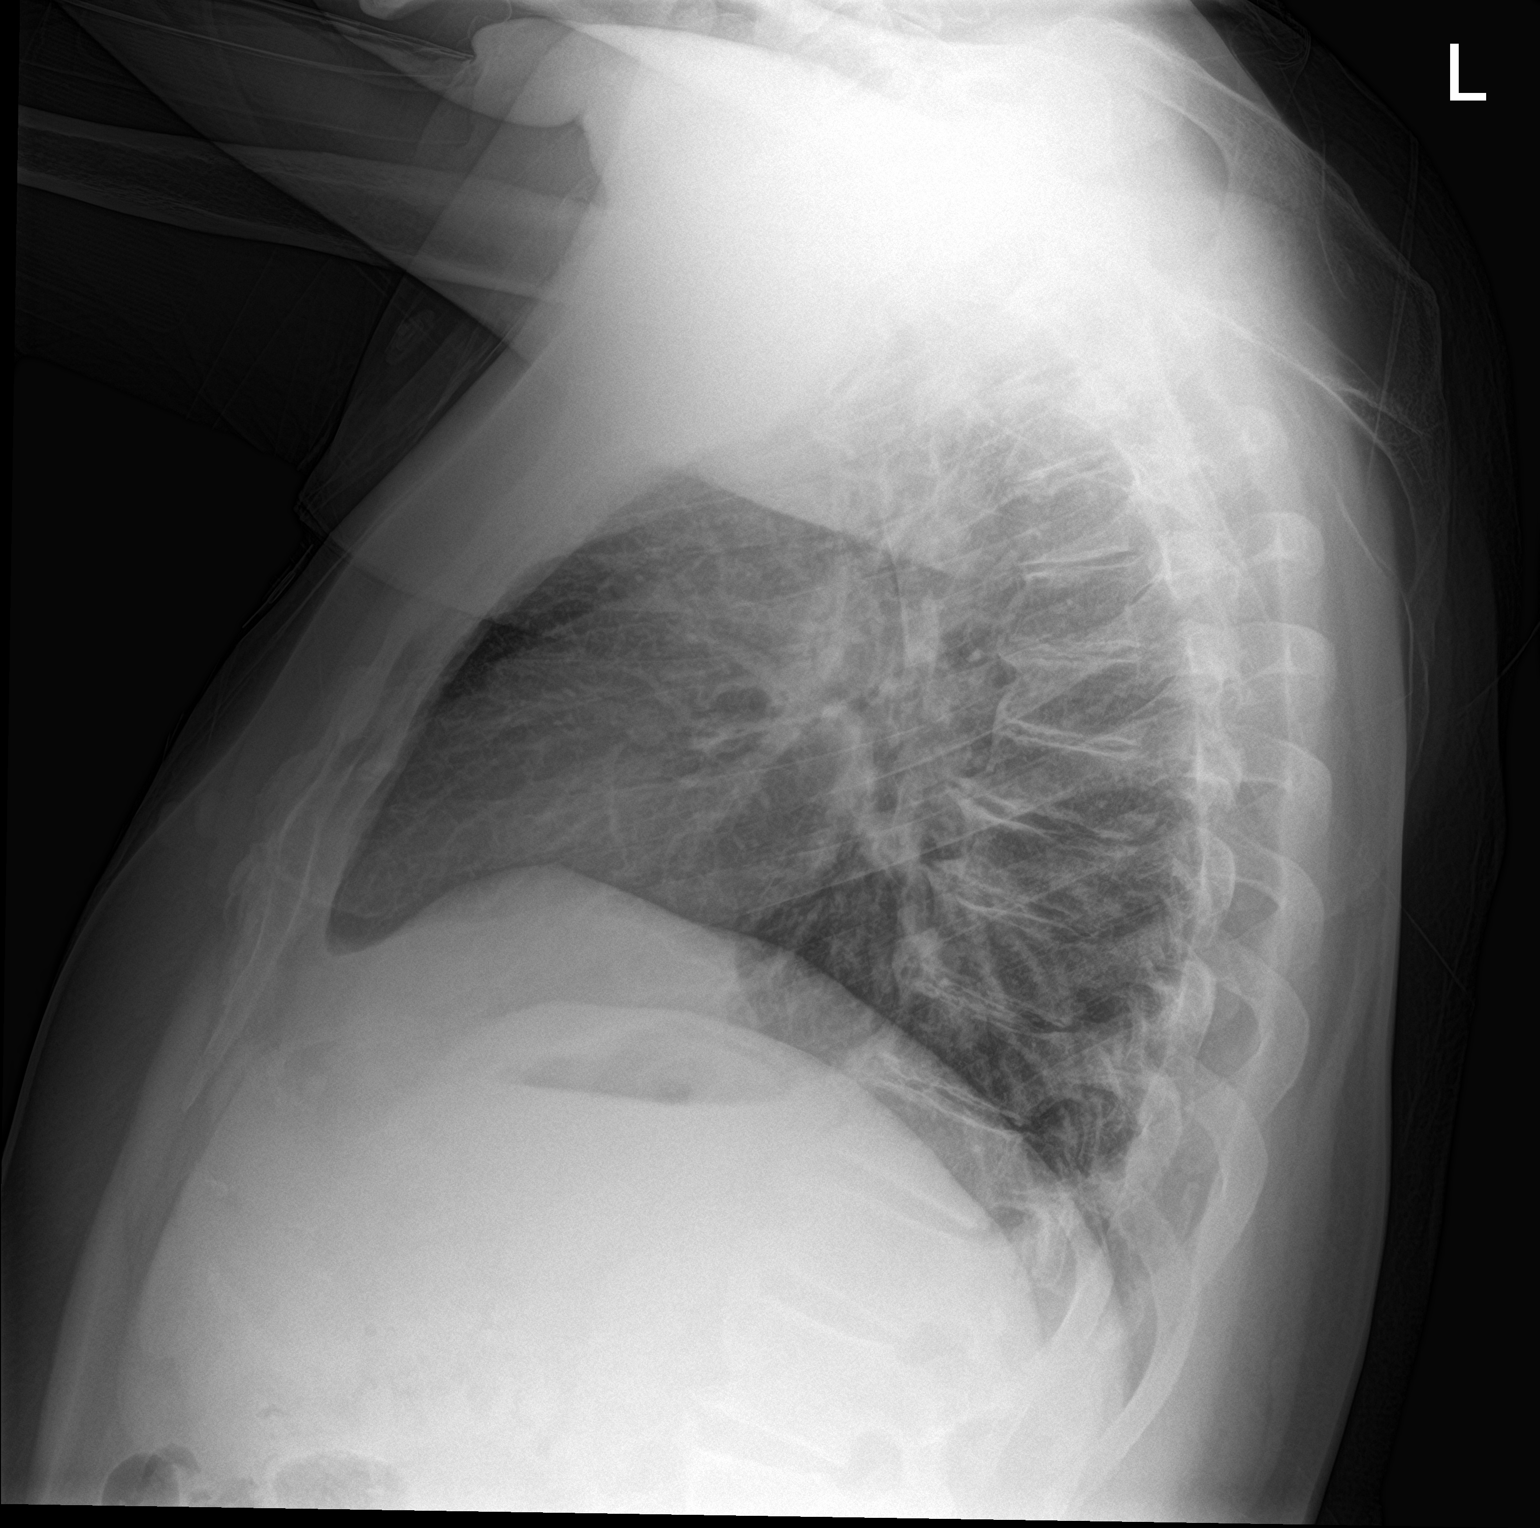

[2 of 2 positions shown; findings below may reference images not displayed]

FINDINGS: Heart and mediastinal contours are within normal limits. No focal
opacities or effusions. No acute bony abnormality. Degenerative
changes in the thoracic spine.
IMPRESSION: No active cardiopulmonary disease.

## 2023-04-23 ENCOUNTER — Encounter: Admit: 2023-04-23 | Discharge: 2023-04-23 | Attending: Internal Medicine | Primary: Internal Medicine

## 2023-04-23 DIAGNOSIS — Z95810 Presence of automatic (implantable) cardiac defibrillator: Secondary | ICD-10-CM | POA: Diagnosis not present

## 2023-04-23 DIAGNOSIS — I509 Heart failure, unspecified: Secondary | ICD-10-CM | POA: Diagnosis not present

## 2023-04-24 ENCOUNTER — Encounter: Admit: 2023-04-24 | Discharge: 2023-04-24 | Attending: Internal Medicine | Primary: Internal Medicine

## 2023-04-24 DIAGNOSIS — Z95810 Presence of automatic (implantable) cardiac defibrillator: Secondary | ICD-10-CM | POA: Diagnosis not present

## 2023-04-24 DIAGNOSIS — I447 Left bundle-branch block, unspecified: Secondary | ICD-10-CM | POA: Diagnosis not present

## 2023-05-05 ENCOUNTER — Ambulatory Visit: Admit: 2023-05-05 | Discharge: 2023-05-06 | Payer: MEDICARE

## 2023-05-05 DIAGNOSIS — R059 Cough, unspecified type: Principal | ICD-10-CM

## 2023-05-05 DIAGNOSIS — R0989 Other specified symptoms and signs involving the circulatory and respiratory systems: Secondary | ICD-10-CM | POA: Diagnosis not present

## 2023-05-05 DIAGNOSIS — R1110 Vomiting, unspecified: Secondary | ICD-10-CM | POA: Diagnosis not present

## 2023-05-05 DIAGNOSIS — R519 Nonintractable headache, unspecified chronicity pattern, unspecified headache type: Principal | ICD-10-CM

## 2023-05-06 ENCOUNTER — Ambulatory Visit: Payer: Medicare HMO

## 2023-06-02 ENCOUNTER — Encounter: Payer: Self-pay | Admitting: Emergency Medicine

## 2023-06-02 ENCOUNTER — Other Ambulatory Visit: Payer: Self-pay

## 2023-06-02 ENCOUNTER — Emergency Department
Admission: EM | Admit: 2023-06-02 | Discharge: 2023-06-02 | Disposition: A | Discharge status: Home / Self Care | Attending: Emergency Medicine | Admitting: Emergency Medicine

## 2023-06-02 DIAGNOSIS — I509 Heart failure, unspecified: Secondary | ICD-10-CM | POA: Diagnosis not present

## 2023-06-02 DIAGNOSIS — E119 Type 2 diabetes mellitus without complications: Secondary | ICD-10-CM | POA: Diagnosis not present

## 2023-06-02 DIAGNOSIS — B9789 Other viral agents as the cause of diseases classified elsewhere: Secondary | ICD-10-CM | POA: Diagnosis not present

## 2023-06-02 DIAGNOSIS — J069 Acute upper respiratory infection, unspecified: Secondary | ICD-10-CM | POA: Diagnosis not present

## 2023-06-02 DIAGNOSIS — R059 Cough, unspecified: Secondary | ICD-10-CM | POA: Diagnosis not present

## 2023-06-02 LAB — RESP PANEL BY RT-PCR (RSV, FLU A&B, COVID)  RVPGX2
Influenza A by PCR: NEGATIVE
Influenza B by PCR: NEGATIVE
Resp Syncytial Virus by PCR: NEGATIVE
SARS Coronavirus 2 by RT PCR: NEGATIVE

## 2023-06-02 MED ORDER — ONDANSETRON 4 MG PO TBDP: 4.0000 mg | ORAL_TABLET | Freq: Three times a day (TID) | ORAL | 0 refills | Status: AC | PRN

## 2023-06-02 NOTE — ED Provider Notes (Signed)
 [facility] Emergency Department Provider Note     Event Date/Time   First MD Initiated Contact with Patient 06/02/23 1753     (approximate)   History   Fever   HPI  Randy [name] is a 53 y.o. male with a history of schizophrenia, DM, GERD, HLD, OSA, depression, and heart failure status post implanted pacer, who presents to the ED for evaluation of of low-grade fevers at home.  Patient reports Tmax 99.3 degrees today.  Also endorsing bodyaches, headache, dizziness.  He has had associated nonbloody, nonbilious nausea and vomiting for the last 2 days.  He describes a nonproductive cough but denies any frank chest pain or shortness of breath.  No significant abdominal pain or bowel changes noted.  Physical Exam   Triage Vital Signs: ED Triage Vitals  Encounter Vitals Group     BP 06/02/23 1738 (!) 141/75     Systolic BP Percentile --      Diastolic BP Percentile --      Pulse Rate 06/02/23 1738 71     Resp 06/02/23 1738 18     Temp 06/02/23 1738 98.6 F (37 C)     Temp Source 06/02/23 1738 Oral     SpO2 06/02/23 1738 99 %     Weight 06/02/23 1739 234 lb (106.1 kg)     Height 06/02/23 1739 5\' 11"  (1.803 m)     Head Circumference --      Peak Flow --      Pain Score 06/02/23 1739 5     Pain Loc --      Pain Education --      Exclude from Growth Chart --     Most recent vital signs: Vitals:   06/02/23 1738  BP: (!) 141/75  Pulse: 71  Resp: 18  Temp: 98.6 F (37 C)  SpO2: 99%    General Awake, no distress. NAD HEENT NCAT. PERRL. EOMI. No rhinorrhea. Mucous membranes are moist.  CV:  Good peripheral perfusion. RRR RESP:  Normal effort. CTA.  No wheeze, rales, or rhonchi noted. ABD:  No distention.    ED Results / Procedures / Treatments   Labs (all labs ordered are listed, but only abnormal results are displayed) Labs Reviewed  RESP PANEL BY RT-PCR (RSV, FLU A&B, COVID)  [id number]     EKG   RADIOLOGY  No results  found.   PROCEDURES:  Critical Care performed: No  Procedures   MEDICATIONS ORDERED IN ED: Medications - No data to display   IMPRESSION / MDM / ASSESSMENT AND PLAN / ED COURSE  I reviewed the triage vital signs and the nursing notes.                              Differential diagnosis includes, but is not limited to, COVID, flu, RSV, viral URI, CAP, bronchitis, gastroenteritis  Patient's presentation is most consistent with acute presentation with potential threat to life or bodily function.  Patient's diagnosis is consistent with viral URI.  Patient with reassuring exam and workup at this time.  Vital signs are stable without signs of tachycardia, fever, or respiratory distress.  Viral panel test is negative at this time.  No adventitious breath sounds or indications on presentation for worsening intrathoracic process.  Patient will be discharged home with prescriptions for Zofran.  He is also given instructions to take OTC cough medicine as needed.  Patient is  to follow up with his primary provider as discussed, as needed or otherwise directed. Patient is given ED precautions to return to the ED for any worsening or new symptoms.  FINAL CLINICAL IMPRESSION(S) / ED DIAGNOSES   Final diagnoses:  Viral URI with cough     Rx / DC Orders   ED Discharge Orders          Ordered    ondansetron (ZOFRAN-ODT) 4 MG disintegrating tablet  Every 8 hours PRN        06/02/23 1844             Note:  This document was prepared using Dragon voice recognition software and may include unintentional dictation errors.    Lissa Hoard, PA-C 06/02/23 1850    Trinna Post, MD 06/02/23 [phone]

## 2023-06-02 NOTE — Discharge Instructions (Signed)
 Your exam is reassuring and your viral panel test is negative at this time.  Continue to manage and treat your symptoms with OTC medications including Tylenol or Motrin for any body aches and fevers.  Consider OTC Delsym cough syrup for cough relief.  Follow-up with your primary provider or return to the ED if needed.  Consider retesting in a week if symptoms persist.

## 2023-06-02 NOTE — ED Triage Notes (Signed)
 Patient to ED via POV for fever- 99.3 at home. Also having body aches, headache, dizziness and vomiting. Ongoing x2 days. NAD noted. Also having non-productive cough.

## 2023-06-07 ENCOUNTER — Ambulatory Visit: Payer: Medicare HMO

## 2023-06-07 DIAGNOSIS — Z1211 Encounter for screening for malignant neoplasm of colon: Secondary | ICD-10-CM

## 2023-06-07 DIAGNOSIS — E119 Type 2 diabetes mellitus without complications: Secondary | ICD-10-CM

## 2023-06-07 DIAGNOSIS — Z0000 Encounter for general adult medical examination without abnormal findings: Secondary | ICD-10-CM

## 2023-06-07 NOTE — Patient Instructions (Addendum)
 Mr. Randy [name] , Thank you for taking time to come for your Medicare Wellness Visit. I appreciate your ongoing commitment to your health goals. Please review the following plan we discussed and let me know if I can assist you in the future.   Referrals/Orders/Follow-Ups/Clinician Recommendations: REFERRAL SENT FOR COLONOSCOPY AND FOR DIABETIC NUTRITION CLASS  This is a list of the screening recommended for you and due dates:  Health Maintenance  Topic Date Due   Zoster (Shingles) Vaccine (1 of 2) Never done   Colon Cancer Screening  04/12/2018   COVID-19 Vaccine (3 - Pfizer risk series) 08/11/2019   Eye exam for diabetics  02/01/2023   DTaP/Tdap/Td vaccine (1 - Tdap) 04/07/2024*   Hepatitis C Screening  04/07/2024*   Hemoglobin A1C  10/06/2023   Yearly kidney function blood test for diabetes  04/07/2024   Yearly kidney health urinalysis for diabetes  04/07/2024   Complete foot exam   04/07/2024   Medicare Annual Wellness Visit  06/06/2024   Pneumococcal Vaccination  Completed   Flu Shot  Completed   HIV Screening  Completed   HPV Vaccine  Aged Out  *Topic was postponed. The date shown is not the original due date.    Advanced directives: (ACP Link)Information on Advanced Care Planning can be found at [facility] Advance Health Care Directives Advance Health Care Directives. [email or web address]   Next Medicare Annual Wellness Visit scheduled for next year: Yes   06/12/24 @ 2:40 PM BY PHONE

## 2023-06-07 NOTE — Progress Notes (Signed)
 Subjective:   Randy [name] is a 53 y.o. who presents for a Medicare Wellness preventive visit.  Visit Complete: Virtual I connected with  [name] on 06/07/23 by a audio enabled telemedicine application and verified that I am speaking with the correct person using two identifiers.  Patient Location: Home  Provider Location: Office/Clinic  I discussed the limitations of evaluation and management by telemedicine. The patient expressed understanding and agreed to proceed.  Vital Signs: Because this visit was a virtual/telehealth visit, some criteria may be missing or patient reported. Any vitals not documented were not able to be obtained and vitals that have been documented are patient reported.  VideoDeclined- This patient declined Librarian, academic. Therefore the visit was completed with audio only.  Persons Participating in Visit: Patient.  AWV Questionnaire: No: Patient Medicare AWV questionnaire was not completed prior to this visit.  Cardiac Risk Factors include: advanced age (>71men, >83 women);male gender;dyslipidemia;diabetes mellitus;obesity (BMI >30kg/m2)     Objective:    Today's Vitals   06/07/23 1558  PainSc: 0-No pain   There is no height or weight on file to calculate BMI.     06/07/2023    4:04 PM 06/02/2023    5:40 PM 10/16/2021   10:24 PM 09/20/2021    2:15 PM 09/07/2021    8:34 PM 07/21/2021    5:53 PM 05/30/2021    8:04 PM  Advanced Directives  Does Patient Have a Medical Advance Directive? No No No No No No No  Would patient like information on creating a medical advance directive? No - Patient declined   Yes (MAU/Ambulatory/Procedural Areas - Information given)  No - Patient declined No - Patient declined    Current Medications (verified) Outpatient Encounter Medications as of 06/07/2023  Medication Sig   albuterol (PROAIR HFA) 108 (90 Base) MCG/ACT inhaler Inhale 1-2 puffs into the lungs every 6 (six) hours as needed  for wheezing or shortness of breath.   ARIPiprazole (ABILIFY) 10 MG tablet Take 2 tablets (20 mg total) by mouth daily.   atorvastatin (LIPITOR) 40 MG tablet Take 1 tablet (40 mg total) by mouth at bedtime.   cetirizine (ZYRTEC) 10 MG tablet Take 10 mg by mouth daily as needed for allergies.   divalproex (DEPAKOTE) 250 MG DR tablet Take 250 mg by mouth at bedtime. + 500mg  = 750mg  nightly, [facility]   divalproex (DEPAKOTE) 500 MG DR tablet 500 mg. + 250mg  dose nightly = 750mg    JARDIANCE 10 MG TABS tablet Take 10 mg by mouth daily.   losartan (COZAAR) 25 MG tablet Take 0.5 tablets (12.5 mg total) by mouth daily.   metFORMIN (GLUCOPHAGE-XR) 500 MG 24 hr tablet Take 1 tablet (500 mg total) by mouth 2 (two) times daily with a meal.   metoprolol succinate (TOPROL-XL) 25 MG 24 hr tablet Take 0.5 tablets (12.5 mg total) by mouth daily.   mirtazapine (REMERON) 15 MG tablet TAKE 1/2 TABLET (7.5mg ) BY MOUTH each night BEFORE bed.   ondansetron (ZOFRAN-ODT) 4 MG disintegrating tablet Take 1 tablet (4 mg total) by mouth every 8 (eight) hours as needed for nausea or vomiting.   FLUoxetine (PROZAC) 10 MG capsule Take 1 capsule (10 mg total) by mouth daily.   furosemide (LASIX) 40 MG tablet Take 1 tablet by mouth daily. (Patient not taking: Reported on 06/07/2023)   No facility-administered encounter medications on file as of 06/07/2023.    Allergies (verified) Patient has no known allergies.   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   Past Surgical History:  Procedure Laterality Date   RIGHT/LEFT HEART CATH AND CORONARY ANGIOGRAPHY N/A 05/31/2021   Procedure: RIGHT/LEFT HEART CATH AND CORONARY ANGIOGRAPHY;  Surgeon: Yvonne Kendall, MD;  Location: [facility] INVASIVE CV LAB;  Service: Cardiovascular;  Laterality: N/A;   Family History  Problem Relation Age of Onset   Alcohol abuse Mother    Cancer Father        colon    Colon cancer Father    Social History   Socioeconomic History   Marital status: Married    Spouse name: Austen Oyster   Number of children: 3   Years of education: Not on file   Highest education level: 11th grade  Occupational History   Occupation: disability   Tobacco Use   Smoking status: Former    Current packs/day: 0.00    Average packs/day: 1.5 packs/day for 5.0 years (7.5 ttl pk-yrs)    Types: Cigarettes    Start date: 03/26/1996    Quit date: 03/26/2001    Years since quitting: 22.2   Smokeless tobacco: Current    Types: Chew  Vaping Use   Vaping status: Former  Substance and Sexual Activity   Alcohol use: No   Drug use: Never   Sexual activity: Yes  Other Topics Concern   Not on file  Social History Narrative   Ongoing financial constraints mentioned during phone call today   Social Drivers of Health   Financial Resource Strain: Low Risk  (06/07/2023)   Overall Financial Resource Strain (CARDIA)    Difficulty of Paying Living Expenses: Not hard at all  Food Insecurity: No Food Insecurity (06/07/2023)   Hunger Vital Sign    Worried About Running Out of Food in the Last Year: Never true    Ran Out of Food in the Last Year: Never true  Transportation Needs: No Transportation Needs (06/07/2023)   PRAPARE - Administrator, Civil Service (Medical): No    Lack of Transportation (Non-Medical): No  Physical Activity: Sufficiently Active (06/07/2023)   Exercise Vital Sign    Days of Exercise per Week: 5 days    Minutes of Exercise per Session: 30 min  Stress: No Stress Concern Present (06/07/2023)   [employer] of Occupational Health - Occupational Stress Questionnaire    Feeling of Stress : Not at all  Social Connections: Socially Isolated (06/07/2023)    Social Connection and Isolation Panel [NHANES]    Frequency of Communication with Friends and Family: More than three times a week    Frequency of Social Gatherings with Friends and Family: More than three times a week    Attends Religious Services: Never    Database administrator or Organizations: No    Attends Engineer, structural: Never    Marital Status: Separated    Tobacco Counseling Ready to quit: Not Answered Counseling given: Not Answered    Clinical Intake:  Pre-visit preparation completed: Yes  Pain : No/denies pain Pain Score: 0-No pain     BMI - recorded: 33.3 Nutritional Status: BMI > 30  Obese Nutritional Risks: None Diabetes: Yes CBG done?: No Did pt. bring in CBG  monitor from home?: No  How often do you need to have someone help you when you read instructions, pamphlets, or other written materials from your doctor or pharmacy?: 1 - Never  Interpreter Needed?: No  Information entered by :: Kennedy Bucker, LPN   Activities of Daily Living     06/07/2023    4:04 PM 12/12/2022    3:05 PM  In your present state of health, do you have any difficulty performing the following activities:  Hearing? 0 0  Vision? 0 0  Difficulty concentrating or making decisions? 0 0  Walking or climbing stairs? 0 0  Dressing or bathing? 0 0  Doing errands, shopping? 0 0  Preparing Food and eating ? N   Using the Toilet? N   In the past six months, have you accidently leaked urine? N   Do you have problems with loss of bowel control? N   Managing your Medications? N   Managing your Finances? N   Housekeeping or managing your Housekeeping? N     Patient Care Team: Smitty Cords, DO as PCP - General (Family Medicine) End, Cristal Deer, MD as PCP - Cardiology (Cardiology) Gustavus Bryant, LCSW as Social Worker (Licensed Clinical Social Worker) End, Cristal Deer, MD as Consulting Physician (Cardiology)  Indicate any recent Medical Services you may have  received from other than [facility] providers in the past year (date may be approximate).     Assessment:   This is a routine wellness examination for Jacari.  Hearing/Vision screen Hearing Screening - Comments:: NO AIDS Vision Screening - Comments:: NO GLASSES   Goals Addressed             This Visit's Progress    DIET - EAT MORE FRUITS AND VEGETABLES         Depression Screen     06/07/2023    4:02 PM 04/08/2023    8:34 AM 12/12/2022    3:04 PM 10/03/2022    2:27 PM 03/21/2022   10:39 AM 02/08/2022    8:32 AM 09/28/2021    2:04 PM  PHQ 2/9 Scores  PHQ - 2 Score 0 2 0 0 4 2 2   PHQ- 9 Score 0 6 0 1 14 11 11     Fall Risk     06/07/2023    4:04 PM 04/08/2023    8:34 AM 12/12/2022    3:04 PM 10/03/2022    2:28 PM 02/08/2022    8:32 AM  Fall Risk   Falls in the past year? 0 0 0 0 0  Number falls in past yr: 0  0 0 0  Injury with Fall? 0  0 0 0  Risk for fall due to : No Fall Risks  No Fall Risks No Fall Risks No Fall Risks  Follow up Falls prevention discussed;Falls evaluation completed  Falls evaluation completed Falls evaluation completed Falls evaluation completed    MEDICARE RISK AT HOME:  Medicare Risk at Home Any stairs in or around the home?: No If so, are there any without handrails?: No Home free of loose throw rugs in walkways, pet beds, electrical cords, etc?: Yes Adequate lighting in your home to reduce risk of falls?: Yes Life alert?: No Use of a cane, walker or w/c?: No Grab bars in the bathroom?: Yes Shower chair or bench in shower?: No Elevated toilet seat or a handicapped toilet?: Yes  TIMED UP AND GO:  Was the test performed?  No  Cognitive Function: 6CIT completed  06/07/2023    4:05 PM 09/28/2021    2:09 PM 09/27/2020    2:44 PM 05/20/2018    1:49 PM  6CIT Screen  What Year? 0 points 0 points 0 points 0 points  What month? 0 points 0 points 0 points 0 points  What time? 0 points 0 points 0 points 0 points  Count back from 20 0 points 0  points 0 points 0 points  Months in reverse 0 points 0 points 0 points 0 points  Repeat phrase 0 points 2 points 6 points 2 points  Total Score 0 points 2 points 6 points 2 points    Immunizations Immunization History  Administered Date(s) Administered   Influenza Inj Mdck Quad With Preservative 01/13/2018   Influenza, Mdck, Trivalent,PF 6+ MOS(egg free) 12/05/2022, 04/01/2023   Influenza,inj,Quad PF,6+ Mos 12/05/2018, 11/29/2019   Influenza-Unspecified 12/17/2016, 01/07/2018, 01/25/2022   PFIZER(Purple Top)SARS-COV-2 Vaccination 06/16/2019, 07/14/2019   PNEUMOCOCCAL CONJUGATE-20 12/04/2022   Pneumococcal Polysaccharide-23 04/10/2017    Screening Tests Health Maintenance  Topic Date Due   Zoster Vaccines- Shingrix (1 of 2) Never done   Colonoscopy  04/12/2018   COVID-19 Vaccine (3 - Pfizer risk series) 08/11/2019   OPHTHALMOLOGY EXAM  02/01/2023   DTaP/Tdap/Td (1 - Tdap) 04/07/2024 (Originally 09/27/1989)   Hepatitis C Screening  04/07/2024 (Originally [birth year]28/1987)   HEMOGLOBIN A1C  10/06/2023   Diabetic kidney evaluation - eGFR measurement  04/07/2024   Diabetic kidney evaluation - Urine ACR  04/07/2024   FOOT EXAM  04/07/2024   Medicare Annual Wellness (AWV)  06/06/2024   Pneumococcal Vaccine 48-26 Years old  Completed   INFLUENZA VACCINE  Completed   HIV Screening  Completed   HPV VACCINES  Aged Out    Health Maintenance  Health Maintenance Due  Topic Date Due   Zoster Vaccines- Shingrix (1 of 2) Never done   Colonoscopy  04/12/2018   COVID-19 Vaccine (3 - Pfizer risk series) 08/11/2019   OPHTHALMOLOGY EXAM  02/01/2023   Health Maintenance Items Addressed: Referral sent to GI for colonoscopy  Additional Screening:  Vision Screening: Recommended annual ophthalmology exams for early detection of glaucoma and other disorders of the eye.  Dental Screening: Recommended annual dental exams for proper oral hygiene  Community Resource Referral / Chronic Care  Management: CRR required this visit?  No   CCM required this visit?  No     Plan:     I have personally reviewed and noted the following in the patient's chart:   Medical and social history Use of alcohol, tobacco or illicit drugs  Current medications and supplements including opioid prescriptions. Patient is not currently taking opioid prescriptions. Functional ability and status Nutritional status Physical activity Advanced directives List of other physicians Hospitalizations, surgeries, and ER visits in previous 12 months Vitals Screenings to include cognitive, depression, and falls Referrals and appointments  In addition, I have reviewed and discussed with patient certain preventive protocols, quality metrics, and best practice recommendations. A written personalized care plan for preventive services as well as general preventive health recommendations were provided to patient.     Hal Hope, LPN   1/61/0960   After Visit Summary: (MyChart) Due to this being a telephonic visit, the after visit summary with patients personalized plan was offered to patient via MyChart   Notes:  SENT REFERRAL FOR COLONOSCOPY & DIABETIC EDUCATION

## 2023-06-27 ENCOUNTER — Encounter: Payer: Self-pay | Admitting: *Deleted

## 2023-07-05 IMAGING — CR DG CHEST 2V
2 series · 2 of 2 positions shown · non-contrast
Comparison: 11/15/2021 radiographs and CT

CLINICAL DATA: Chest pain.  Shortness of breath and nausea.

EXAM:
CHEST - 2 VIEW

[chest pa]
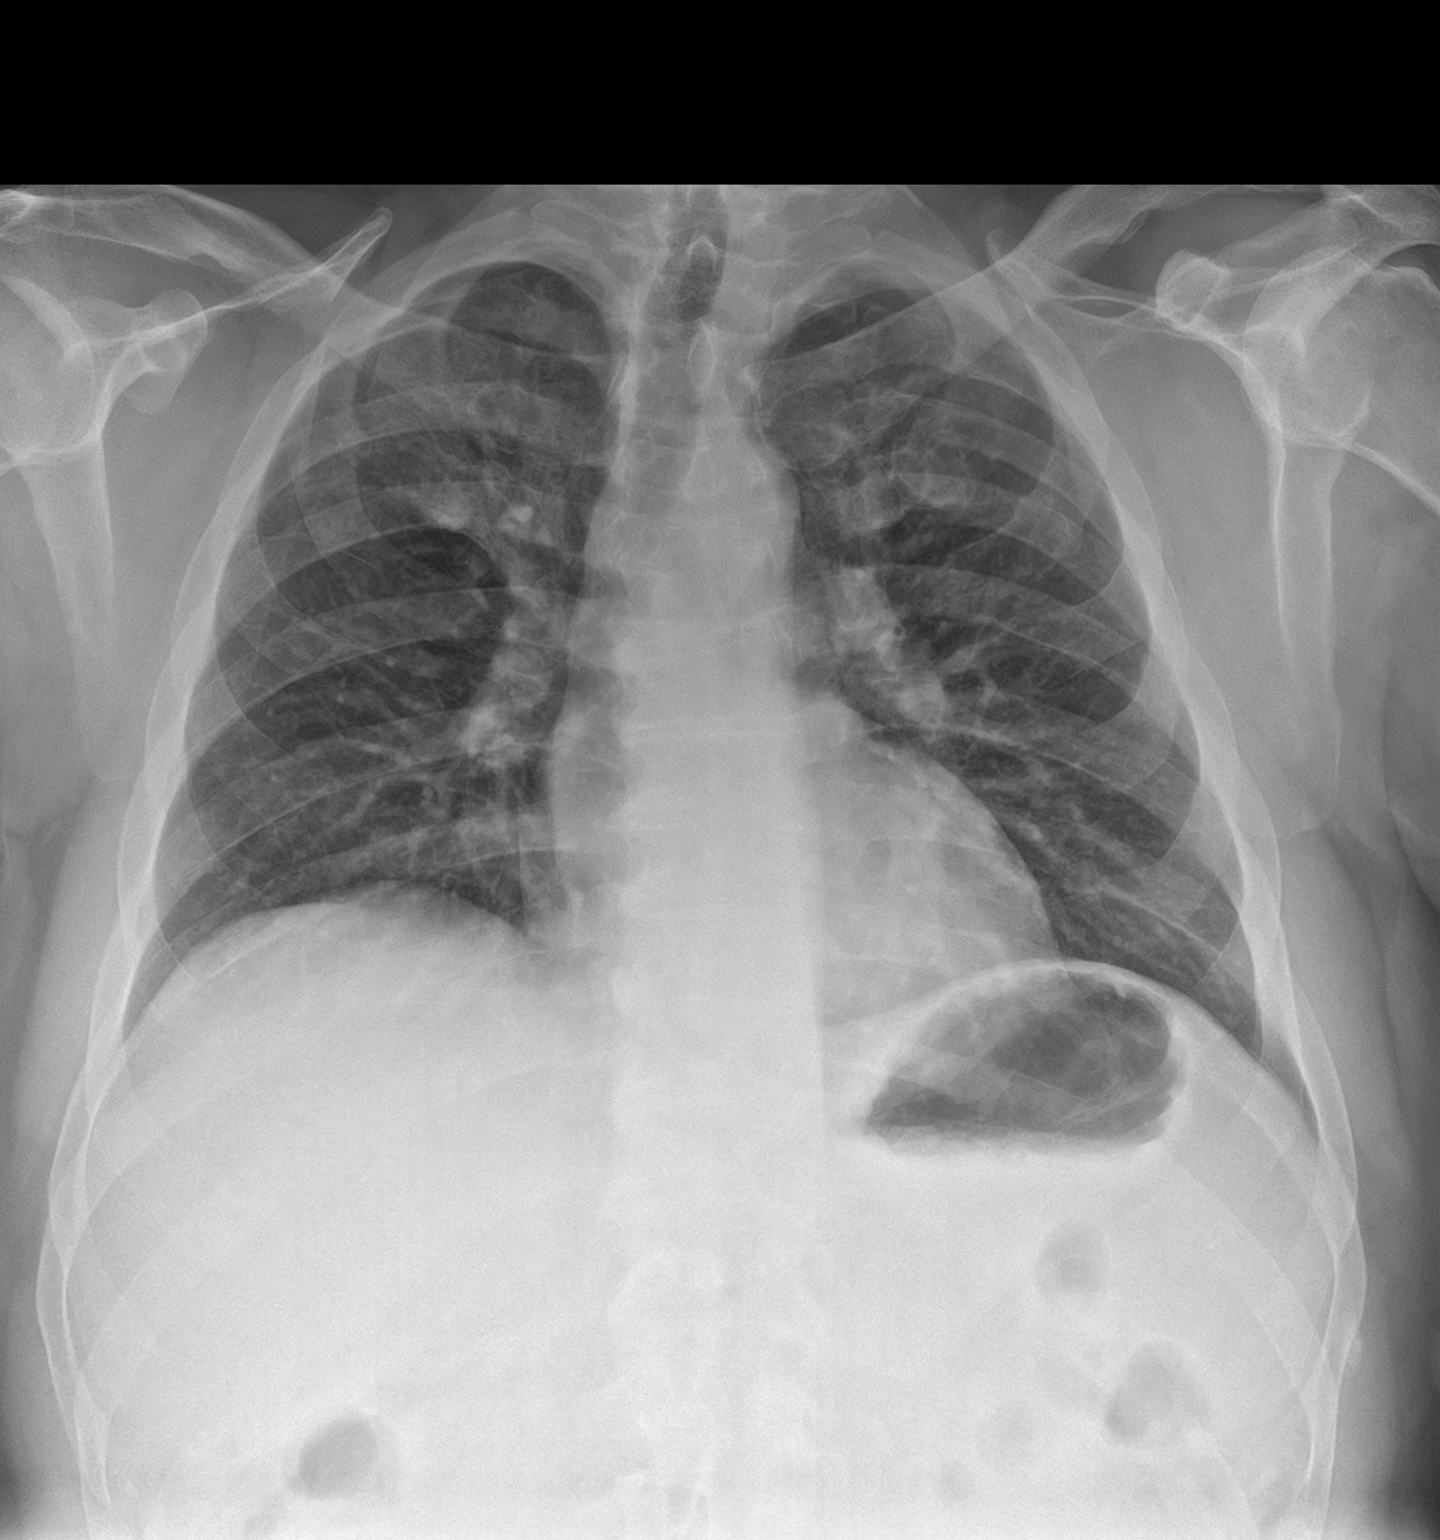

[chest lat]
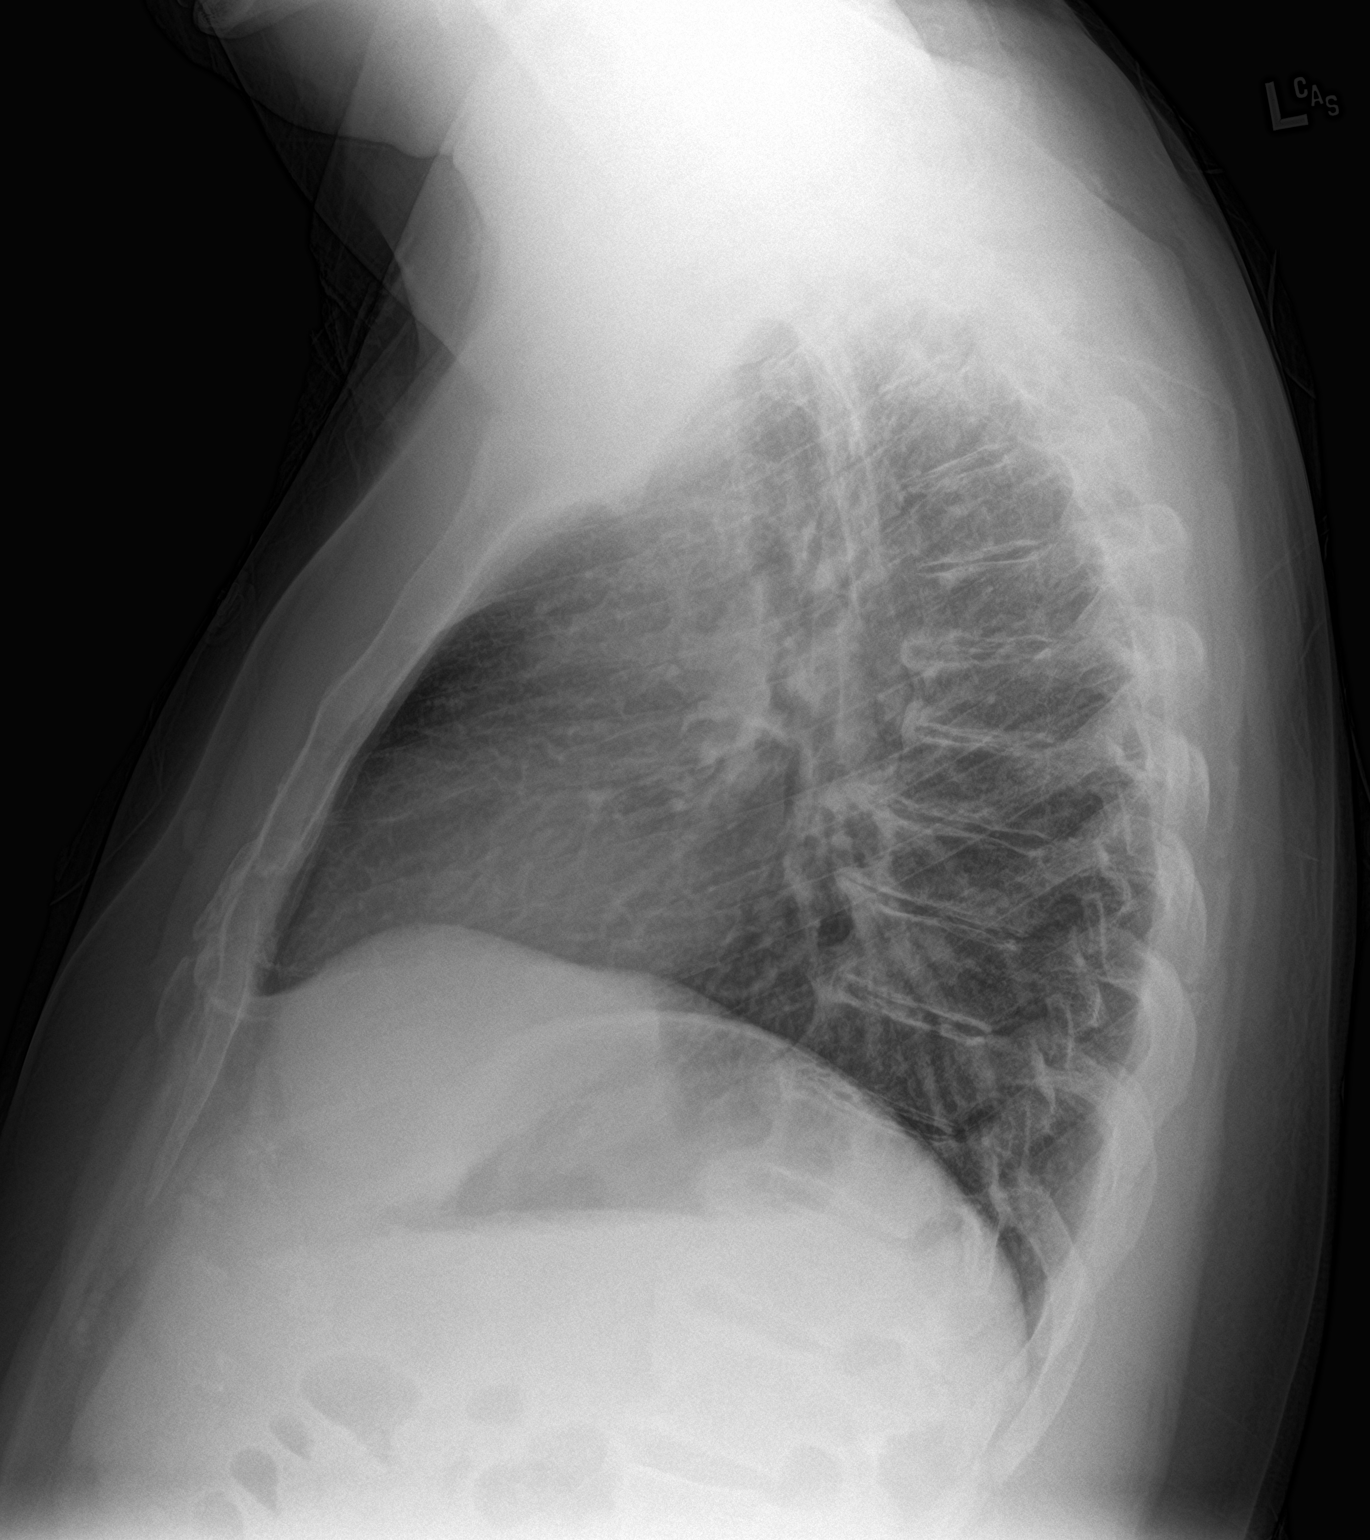

[2 of 2 positions shown; findings below may reference images not displayed]

FINDINGS: Stable lung volumes. Normal heart size with unchanged mediastinal
contours. Chronic scarring in the right upper lobe. No acute
airspace disease. No pulmonary edema, pleural effusion or
pneumothorax. Thoracic spondylosis.
IMPRESSION: No acute chest findings.

## 2023-07-12 DIAGNOSIS — G4733 Obstructive sleep apnea (adult) (pediatric): Secondary | ICD-10-CM | POA: Diagnosis not present

## 2023-07-12 DIAGNOSIS — Z7985 Long-term (current) use of injectable non-insulin antidiabetic drugs: Secondary | ICD-10-CM | POA: Diagnosis not present

## 2023-07-12 DIAGNOSIS — R1012 Left upper quadrant pain: Secondary | ICD-10-CM | POA: Diagnosis not present

## 2023-07-12 DIAGNOSIS — Z7951 Long term (current) use of inhaled steroids: Secondary | ICD-10-CM | POA: Diagnosis not present

## 2023-07-12 DIAGNOSIS — R9431 Abnormal electrocardiogram [ECG] [EKG]: Secondary | ICD-10-CM | POA: Diagnosis not present

## 2023-07-12 DIAGNOSIS — R112 Nausea with vomiting, unspecified: Secondary | ICD-10-CM | POA: Diagnosis not present

## 2023-07-12 DIAGNOSIS — J449 Chronic obstructive pulmonary disease, unspecified: Secondary | ICD-10-CM | POA: Diagnosis not present

## 2023-07-12 DIAGNOSIS — Z79899 Other long term (current) drug therapy: Secondary | ICD-10-CM | POA: Diagnosis not present

## 2023-07-12 DIAGNOSIS — E119 Type 2 diabetes mellitus without complications: Secondary | ICD-10-CM | POA: Diagnosis not present

## 2023-07-12 DIAGNOSIS — K209 Esophagitis, unspecified without bleeding: Secondary | ICD-10-CM | POA: Diagnosis not present

## 2023-07-12 DIAGNOSIS — I509 Heart failure, unspecified: Secondary | ICD-10-CM | POA: Diagnosis not present

## 2023-07-12 DIAGNOSIS — Z7984 Long term (current) use of oral hypoglycemic drugs: Secondary | ICD-10-CM | POA: Diagnosis not present

## 2023-07-12 DIAGNOSIS — F319 Bipolar disorder, unspecified: Secondary | ICD-10-CM | POA: Diagnosis not present

## 2023-07-12 DIAGNOSIS — F209 Schizophrenia, unspecified: Secondary | ICD-10-CM | POA: Diagnosis not present

## 2023-07-12 DIAGNOSIS — R61 Generalized hyperhidrosis: Secondary | ICD-10-CM | POA: Diagnosis not present

## 2023-07-12 DIAGNOSIS — R0602 Shortness of breath: Secondary | ICD-10-CM | POA: Diagnosis not present

## 2023-07-12 DIAGNOSIS — F17220 Nicotine dependence, chewing tobacco, uncomplicated: Secondary | ICD-10-CM | POA: Diagnosis not present

## 2023-07-12 DIAGNOSIS — C819A Hodgkin lymphoma, unspecified, in remission: Secondary | ICD-10-CM | POA: Diagnosis not present

## 2023-07-12 MED ORDER — ONDANSETRON 4 MG DISINTEGRATING TABLET
ORAL_TABLET | Freq: Three times a day (TID) | ORAL | 0 refills | 5.00 days | Status: CP | PRN
Start: 2023-07-12 — End: 2023-07-19

## 2023-07-12 MED ORDER — FAMOTIDINE 20 MG TABLET
ORAL_TABLET | Freq: Two times a day (BID) | ORAL | 0 refills | 15.00 days | Status: CP
Start: 2023-07-12 — End: 2023-07-27

## 2023-07-12 MED ORDER — SUCRALFATE 100 MG/ML ORAL SUSPENSION
Freq: Four times a day (QID) | ORAL | 0 refills | 11.00 days | Status: CP
Start: 2023-07-12 — End: 2023-08-11

## 2023-07-13 ENCOUNTER — Emergency Department
Admit: 2023-07-13 | Discharge: 2023-07-13 | Disposition: A | Discharge status: Home / Self Care | Payer: Medicare (Managed Care) | Attending: Emergency Medicine

## 2023-07-13 DIAGNOSIS — K209 Esophagitis, unspecified without bleeding: Secondary | ICD-10-CM | POA: Diagnosis not present

## 2023-07-17 MED ORDER — METOPROLOL SUCCINATE ER 100 MG TABLET,EXTENDED RELEASE 24 HR
ORAL_TABLET | Freq: Every day | ORAL | 0 refills | 0.00 days
Start: 2023-07-17 — End: ?

## 2023-07-18 MED ORDER — METOPROLOL SUCCINATE ER 100 MG TABLET,EXTENDED RELEASE 24 HR
ORAL_TABLET | Freq: Every day | ORAL | 3 refills | 90.00 days | Status: CP
Start: 2023-07-18 — End: ?

## 2023-07-23 ENCOUNTER — Encounter
Admit: 2023-07-23 | Discharge: 2023-07-23 | Payer: Medicare (Managed Care) | Attending: Internal Medicine | Primary: Internal Medicine

## 2023-07-23 DIAGNOSIS — Z4502 Encounter for adjustment and management of automatic implantable cardiac defibrillator: Secondary | ICD-10-CM | POA: Diagnosis not present

## 2023-07-23 DIAGNOSIS — I509 Heart failure, unspecified: Secondary | ICD-10-CM | POA: Diagnosis not present

## 2023-07-23 DIAGNOSIS — Z95810 Presence of automatic (implantable) cardiac defibrillator: Secondary | ICD-10-CM | POA: Diagnosis not present

## 2023-07-24 ENCOUNTER — Encounter
Admit: 2023-07-24 | Discharge: 2023-07-24 | Payer: Medicare (Managed Care) | Attending: Internal Medicine | Primary: Internal Medicine

## 2023-07-24 DIAGNOSIS — I447 Left bundle-branch block, unspecified: Secondary | ICD-10-CM | POA: Diagnosis not present

## 2023-07-24 DIAGNOSIS — Z95810 Presence of automatic (implantable) cardiac defibrillator: Secondary | ICD-10-CM | POA: Diagnosis not present

## 2023-07-24 DIAGNOSIS — Z4502 Encounter for adjustment and management of automatic implantable cardiac defibrillator: Secondary | ICD-10-CM | POA: Diagnosis not present

## 2023-08-05 ENCOUNTER — Ambulatory Visit: Admitting: Dietician

## 2023-08-14 ENCOUNTER — Other Ambulatory Visit: Payer: Self-pay | Admitting: Family Medicine

## 2023-08-14 DIAGNOSIS — E1169 Type 2 diabetes mellitus with other specified complication: Secondary | ICD-10-CM

## 2023-08-14 MED ORDER — SPIRONOLACTONE 25 MG TABLET
ORAL_TABLET | Freq: Every day | ORAL | 0 refills | 0.00000 days
Start: 2023-08-14 — End: ?

## 2023-08-15 MED ORDER — SPIRONOLACTONE 25 MG TABLET
ORAL_TABLET | Freq: Every day | ORAL | 1 refills | 90.00000 days | Status: CP
Start: 2023-08-15 — End: ?

## 2023-08-15 NOTE — Telephone Encounter (Signed)
 Requested Prescriptions  Refused Prescriptions Disp Refills   metFORMIN  (GLUCOPHAGE -XR) 500 MG 24 hr tablet [Pharmacy Med Name: metformin  ER 500 mg tablet,extended release 24 hr] 180 tablet 1    Sig: TAKE ONE TABLET BY MOUTH TWICE DAILY WITH FOOD     Endocrinology:  Diabetes - Biguanides Failed - 08/15/2023  4:42 PM      Failed - B12 Level in normal range and within 720 days    No results found for: "VITAMINB12"       Failed - CBC within normal limits and completed in the last 12 months    WBC  Date Value Ref Range Status  10/16/2021 5.7 4.0 - 10.5 K/uL Final   RBC  Date Value Ref Range Status  10/16/2021 4.40 4.22 - 5.81 MIL/uL Final   Hemoglobin  Date Value Ref Range Status  10/16/2021 12.7 (L) 13.0 - 17.0 g/dL Final   HGB  Date Value Ref Range Status  04/15/2014 14.6 13.0 - 18.0 g/dL Final   HCT  Date Value Ref Range Status  10/16/2021 38.5 (L) 39.0 - 52.0 % Final  04/15/2014 43.8 40.0 - 52.0 % Final   MCHC  Date Value Ref Range Status  10/16/2021 33.0 30.0 - 36.0 g/dL Final   [facility]  Date Value Ref Range Status  10/16/2021 28.9 26.0 - 34.0 pg Final   MCV  Date Value Ref Range Status  10/16/2021 87.5 80.0 - 100.0 fL Final  04/15/2014 87 80 - 100 fL Final   No results found for: "PLTCOUNTKUC", "LABPLAT", "POCPLA" RDW  Date Value Ref Range Status  10/16/2021 14.3 11.5 - 15.5 % Final  04/15/2014 15.0 (H) 11.5 - 14.5 % Final         Passed - Cr in normal range and within 360 days    Creat  Date Value Ref Range Status  04/08/2023 0.96 0.70 - 1.30 mg/dL Final   Creatinine, Urine  Date Value Ref Range Status  04/08/2023 112 20 - 320 mg/dL Final         Passed - HBA1C is between 0 and 7.9 and within 180 days    Hgb A1c MFr Bld  Date Value Ref Range Status  04/08/2023 5.9 (H) <5.7 % of total Hgb Final    Comment:    For someone without known diabetes, a hemoglobin  A1c value between 5.7% and 6.4% is consistent with prediabetes and should be confirmed with a   follow-up test. . For someone with known diabetes, a value <7% indicates that their diabetes is well controlled. A1c targets should be individualized based on duration of diabetes, age, comorbid conditions, and other considerations. . This assay result is consistent with an increased risk of diabetes. . Currently, no consensus exists regarding use of hemoglobin A1c for diagnosis of diabetes for children. .          Passed - eGFR in normal range and within 360 days    GFR, Est African American  Date Value Ref Range Status  12/28/2019 99 > OR = 60 mL/min/1.36m2 Final   GFR, Est Non African American  Date Value Ref Range Status  12/28/2019 85 > OR = 60 mL/min/1.60m2 Final   GFR, Estimated  Date Value Ref Range Status  10/16/2021 >60 >60 mL/min Final    Comment:    (NOTE) Calculated using the CKD-EPI Creatinine Equation (2021)    eGFR  Date Value Ref Range Status  04/08/2023 95 > OR = 60 mL/min/1.21m2 Final  Passed - Valid encounter within last 6 months    Recent Outpatient Visits   None     Future Appointments             In 1 month Karamalegos, Kayleen Party, DO [facility], [facility]

## 2023-08-26 DIAGNOSIS — Z79899 Other long term (current) drug therapy: Secondary | ICD-10-CM | POA: Diagnosis not present

## 2023-08-26 DIAGNOSIS — G252 Other specified forms of tremor: Secondary | ICD-10-CM | POA: Diagnosis not present

## 2023-08-26 DIAGNOSIS — F17290 Nicotine dependence, other tobacco product, uncomplicated: Secondary | ICD-10-CM | POA: Diagnosis not present

## 2023-08-26 DIAGNOSIS — F250 Schizoaffective disorder, bipolar type: Secondary | ICD-10-CM | POA: Diagnosis not present

## 2023-08-26 DIAGNOSIS — G2401 Drug induced subacute dyskinesia: Secondary | ICD-10-CM | POA: Diagnosis not present

## 2023-09-11 ENCOUNTER — Other Ambulatory Visit: Payer: Self-pay | Admitting: Family Medicine

## 2023-09-11 DIAGNOSIS — E1169 Type 2 diabetes mellitus with other specified complication: Secondary | ICD-10-CM

## 2023-09-13 NOTE — Telephone Encounter (Signed)
 Requested medication (s) are due for refill today:   Yes  Requested medication (s) are on the active medication list:   Yes  Future visit scheduled:   Yes 7/14   Physical was 04/08/2023   Last ordered: 10/03/2022 #180, 3 refills  Unable to refill because CBC is due.      Requested Prescriptions  Pending Prescriptions Disp Refills   metFORMIN  (GLUCOPHAGE -XR) 500 MG 24 hr tablet [Pharmacy Med Name: metformin  ER 500 mg tablet,extended release 24 hr] 180 tablet 1    Sig: TAKE ONE TABLET BY MOUTH TWICE DAILY WITH FOOD     Endocrinology:  Diabetes - Biguanides Failed - 09/13/2023  1:46 PM      Failed - B12 Level in normal range and within 720 days    No results found for: VITAMINB12       Failed - CBC within normal limits and completed in the last 12 months    WBC  Date Value Ref Range Status  10/16/2021 5.7 4.0 - 10.5 K/uL Final   RBC  Date Value Ref Range Status  10/16/2021 4.40 4.22 - 5.81 MIL/uL Final   Hemoglobin  Date Value Ref Range Status  10/16/2021 12.7 (L) 13.0 - 17.0 g/dL Final   HGB  Date Value Ref Range Status  04/15/2014 14.6 13.0 - 18.0 g/dL Final   HCT  Date Value Ref Range Status  10/16/2021 38.5 (L) 39.0 - 52.0 % Final  04/15/2014 43.8 40.0 - 52.0 % Final   MCHC  Date Value Ref Range Status  10/16/2021 33.0 30.0 - 36.0 g/dL Final   [facility]  Date Value Ref Range Status  10/16/2021 28.9 26.0 - 34.0 pg Final   MCV  Date Value Ref Range Status  10/16/2021 87.5 80.0 - 100.0 fL Final  04/15/2014 87 80 - 100 fL Final   No results found for: PLTCOUNTKUC, LABPLAT, POCPLA RDW  Date Value Ref Range Status  10/16/2021 14.3 11.5 - 15.5 % Final  04/15/2014 15.0 (H) 11.5 - 14.5 % Final         Passed - Cr in normal range and within 360 days    Creat  Date Value Ref Range Status  04/08/2023 0.96 0.70 - 1.30 mg/dL Final   Creatinine, Urine  Date Value Ref Range Status  04/08/2023 112 20 - 320 mg/dL Final         Passed - HBA1C is between 0 and  7.9 and within 180 days    Hgb A1c MFr Bld  Date Value Ref Range Status  04/08/2023 5.9 (H) <5.7 % of total Hgb Final    Comment:    For someone without known diabetes, a hemoglobin  A1c value between 5.7% and 6.4% is consistent with prediabetes and should be confirmed with a  follow-up test. . For someone with known diabetes, a value <7% indicates that their diabetes is well controlled. A1c targets should be individualized based on duration of diabetes, age, comorbid conditions, and other considerations. . This assay result is consistent with an increased risk of diabetes. . Currently, no consensus exists regarding use of hemoglobin A1c for diagnosis of diabetes for children. .          Passed - eGFR in normal range and within 360 days    GFR, Est African American  Date Value Ref Range Status  12/28/2019 99 > OR = 60 mL/min/1.61m2 Final   GFR, Est Non African American  Date Value Ref Range Status  12/28/2019 85 > OR =  60 mL/min/1.69m2 Final   GFR, Estimated  Date Value Ref Range Status  10/16/2021 >60 >60 mL/min Final    Comment:    (NOTE) Calculated using the CKD-EPI Creatinine Equation (2021)    eGFR  Date Value Ref Range Status  04/08/2023 95 > OR = 60 mL/min/1.27m2 Final         Passed - Valid encounter within last 6 months    Recent Outpatient Visits   None     Future Appointments             In 3 weeks Romeo Co, Kayleen Party, DO [facility], [facility]

## 2023-10-07 ENCOUNTER — Ambulatory Visit: Payer: Self-pay | Admitting: Family Medicine

## 2023-10-08 DIAGNOSIS — G252 Other specified forms of tremor: Secondary | ICD-10-CM | POA: Diagnosis not present

## 2023-10-08 DIAGNOSIS — G2401 Drug induced subacute dyskinesia: Secondary | ICD-10-CM | POA: Diagnosis not present

## 2023-10-08 DIAGNOSIS — F17290 Nicotine dependence, other tobacco product, uncomplicated: Secondary | ICD-10-CM | POA: Diagnosis not present

## 2023-10-08 DIAGNOSIS — Z79899 Other long term (current) drug therapy: Secondary | ICD-10-CM | POA: Diagnosis not present

## 2023-10-08 DIAGNOSIS — F250 Schizoaffective disorder, bipolar type: Secondary | ICD-10-CM | POA: Diagnosis not present

## 2023-10-22 ENCOUNTER — Encounter
Admit: 2023-10-22 | Discharge: 2023-10-22 | Payer: Medicare (Managed Care) | Attending: Internal Medicine | Primary: Internal Medicine

## 2023-10-22 DIAGNOSIS — Z95810 Presence of automatic (implantable) cardiac defibrillator: Secondary | ICD-10-CM | POA: Diagnosis not present

## 2023-10-22 DIAGNOSIS — I509 Heart failure, unspecified: Secondary | ICD-10-CM | POA: Diagnosis not present

## 2023-10-23 ENCOUNTER — Encounter
Admit: 2023-10-23 | Discharge: 2023-10-23 | Payer: Medicare (Managed Care) | Attending: Internal Medicine | Primary: Internal Medicine

## 2023-10-23 DIAGNOSIS — Z95810 Presence of automatic (implantable) cardiac defibrillator: Secondary | ICD-10-CM | POA: Diagnosis not present

## 2023-10-23 DIAGNOSIS — I447 Left bundle-branch block, unspecified: Secondary | ICD-10-CM | POA: Diagnosis not present

## 2023-11-05 DIAGNOSIS — F17290 Nicotine dependence, other tobacco product, uncomplicated: Secondary | ICD-10-CM | POA: Diagnosis not present

## 2023-11-05 DIAGNOSIS — G252 Other specified forms of tremor: Secondary | ICD-10-CM | POA: Diagnosis not present

## 2023-11-05 DIAGNOSIS — F250 Schizoaffective disorder, bipolar type: Secondary | ICD-10-CM | POA: Diagnosis not present

## 2023-11-05 DIAGNOSIS — Z79899 Other long term (current) drug therapy: Secondary | ICD-10-CM | POA: Diagnosis not present

## 2023-11-05 DIAGNOSIS — G2401 Drug induced subacute dyskinesia: Secondary | ICD-10-CM | POA: Diagnosis not present

## 2023-11-07 ENCOUNTER — Ambulatory Visit: Admitting: Family Medicine

## 2023-11-07 DIAGNOSIS — I2510 Atherosclerotic heart disease of native coronary artery without angina pectoris: Secondary | ICD-10-CM | POA: Diagnosis not present

## 2023-11-07 DIAGNOSIS — Z95810 Presence of automatic (implantable) cardiac defibrillator: Secondary | ICD-10-CM | POA: Diagnosis not present

## 2023-11-07 DIAGNOSIS — G4733 Obstructive sleep apnea (adult) (pediatric): Secondary | ICD-10-CM | POA: Diagnosis not present

## 2023-11-07 DIAGNOSIS — R9431 Abnormal electrocardiogram [ECG] [EKG]: Secondary | ICD-10-CM | POA: Diagnosis not present

## 2023-11-07 DIAGNOSIS — I5022 Chronic systolic (congestive) heart failure: Secondary | ICD-10-CM | POA: Diagnosis not present

## 2023-11-19 ENCOUNTER — Other Ambulatory Visit: Payer: Self-pay

## 2023-11-19 ENCOUNTER — Telehealth: Payer: Self-pay

## 2023-11-19 ENCOUNTER — Other Ambulatory Visit: Payer: Self-pay | Admitting: Family Medicine

## 2023-11-19 ENCOUNTER — Encounter: Payer: Self-pay | Admitting: Family Medicine

## 2023-11-19 ENCOUNTER — Ambulatory Visit (INDEPENDENT_AMBULATORY_CARE_PROVIDER_SITE_OTHER): Admitting: Family Medicine

## 2023-11-19 VITALS — BP 124/64 | HR 63 | Ht 71.0 in | Wt 244.1 lb

## 2023-11-19 DIAGNOSIS — Z125 Encounter for screening for malignant neoplasm of prostate: Secondary | ICD-10-CM

## 2023-11-19 DIAGNOSIS — Z7984 Long term (current) use of oral hypoglycemic drugs: Secondary | ICD-10-CM | POA: Diagnosis not present

## 2023-11-19 DIAGNOSIS — I5022 Chronic systolic (congestive) heart failure: Secondary | ICD-10-CM | POA: Diagnosis not present

## 2023-11-19 DIAGNOSIS — E785 Hyperlipidemia, unspecified: Secondary | ICD-10-CM

## 2023-11-19 DIAGNOSIS — E1169 Type 2 diabetes mellitus with other specified complication: Secondary | ICD-10-CM | POA: Diagnosis not present

## 2023-11-19 DIAGNOSIS — Z0000 Encounter for general adult medical examination without abnormal findings: Secondary | ICD-10-CM

## 2023-11-19 DIAGNOSIS — F200 Paranoid schizophrenia: Secondary | ICD-10-CM

## 2023-11-19 DIAGNOSIS — I2510 Atherosclerotic heart disease of native coronary artery without angina pectoris: Secondary | ICD-10-CM | POA: Diagnosis not present

## 2023-11-19 DIAGNOSIS — Z79899 Other long term (current) drug therapy: Secondary | ICD-10-CM

## 2023-11-19 DIAGNOSIS — Z860100 Personal history of colon polyps, unspecified: Secondary | ICD-10-CM

## 2023-11-19 MED ORDER — ATORVASTATIN CALCIUM 40 MG PO TABS: 40.0000 mg | ORAL_TABLET | Freq: Every day | ORAL | 3 refills | Status: AC

## 2023-11-19 MED ORDER — METOPROLOL SUCCINATE ER 25 MG PO TB24: 12.5000 mg | ORAL_TABLET | Freq: Every day | ORAL | 3 refills | Status: AC

## 2023-11-19 MED ORDER — NA SULFATE-K SULFATE-MG SULF 17.5-3.13-1.6 GM/177ML PO SOLN: 1.0000 | Freq: Once | ORAL | 0 refills | Status: AC

## 2023-11-19 NOTE — Telephone Encounter (Signed)
 Gastroenterology Pre-Procedure Review  Request Date: 03/02/24 Requesting Physician: Dr. Jinny  PATIENT REVIEW QUESTIONS: The patient responded to the following health history questions as indicated:    1. Are you having any GI issues? no 2. Do you have a personal history of Polyps? yes (last colonoscopy performed at [facility] 04/12/2017 recommended repeat in 1 year) 3. Do you have a family history of Colon Cancer or Polyps? no 4. Diabetes Mellitus? yes (has been advised verbally and noted on instructions tostop Jardiance 3 days prior and stop metformin  2 days prior) 5. Joint replacements in the past 12 months?no 6. Major health problems in the past 3 months?no 7. Any artificial heart valves, MVP, or defibrillator?no    MEDICATIONS & ALLERGIES:    Patient reports the following regarding taking any anticoagulation/antiplatelet therapy:   Plavix, Coumadin, Eliquis, Xarelto, Lovenox , Pradaxa, Brilinta, or Effient? no Aspirin ? no  Patient confirms/reports the following medications:  Current Outpatient Medications  Medication Sig Dispense Refill   Na Sulfate-K Sulfate-Mg Sulfate concentrate (SUPREP) 17.5-3.13-1.6 GM/177ML SOLN Take 1 kit (354 mLs total) by mouth once for 1 dose. 354 mL 0   albuterol  (PROAIR  HFA) 108 (90 Base) MCG/ACT inhaler Inhale 1-2 puffs into the lungs every 6 (six) hours as needed for wheezing or shortness of breath. 1 g 3   ARIPiprazole  (ABILIFY ) 10 MG tablet Take 2 tablets (20 mg total) by mouth daily. 180 tablet 1   atorvastatin  (LIPITOR) 40 MG tablet Take 1 tablet (40 mg total) by mouth at bedtime. 90 tablet 3   cetirizine  (ZYRTEC ) 10 MG tablet Take 10 mg by mouth daily as needed for allergies.     divalproex  (DEPAKOTE ) 250 MG DR tablet Take 250 mg by mouth at bedtime. + 500mg  = 750mg  nightly, [facility]     divalproex  (DEPAKOTE ) 500 MG DR tablet 500 mg. + 250mg  dose nightly = 750mg      FLUoxetine  (PROZAC ) 10 MG capsule Take 1 capsule (10 mg total) by mouth  daily. 30 capsule 0   JARDIANCE 10 MG TABS tablet Take 10 mg by mouth daily.     losartan  (COZAAR ) 25 MG tablet Take 0.5 tablets (12.5 mg total) by mouth daily. 45 tablet 3   metFORMIN  (GLUCOPHAGE -XR) 500 MG 24 hr tablet TAKE ONE TABLET BY MOUTH TWICE DAILY WITH FOOD 180 tablet 1   metoprolol  succinate (TOPROL -XL) 25 MG 24 hr tablet Take 0.5 tablets (12.5 mg total) by mouth daily. 45 tablet 3   mirtazapine (REMERON) 15 MG tablet TAKE 1/2 TABLET (7.5mg ) BY MOUTH each night BEFORE bed.     ondansetron  (ZOFRAN -ODT) 4 MG disintegrating tablet Take 1 tablet (4 mg total) by mouth every 8 (eight) hours as needed for nausea or vomiting. 15 tablet 0   No current facility-administered medications for this visit.    Patient confirms/reports the following allergies:  No Known Allergies  No orders of the defined types were placed in this encounter.   AUTHORIZATION INFORMATION Primary Insurance: 1D#: Group #:  Secondary Insurance: 1D#: Group #:  SCHEDULE INFORMATION: Date: 03/02/24 Time: Location: [facility]

## 2023-11-19 NOTE — Progress Notes (Signed)
 Subjective:    Patient ID: Randy [name], male    DOB: Dec 20, 1970, 53 y.o.   MRN: [id number]  Randy [name] is a 53 y.o. male presenting on 11/19/2023 for Medical Management of Chronic Issues   HPI  Discussed the use of AI scribe software for clinical note transcription with the patient, who gave verbal consent to proceed.  History of Present Illness   Randy [name] is a 53 year old male who presents for routine follow-up and management of chronic conditions.  Chronic disease surveillance - Routine follow-up for chronic condition management. - Blood draw last completed in April; currently due for repeat laboratory evaluation.  Diabetic retinopathy screening - Due for diabetic eye examination. - Uncertain of last eye exam date and provider name. - Records indicate  Eye as previous provider.  Colorectal cancer screening - Overdue for colonoscopy. - Attempts to coordinate colonoscopy with [facility] have not resulted in a call back. [facility] has mailed letter and tried to call     Schizophrenia / Major Depression recurrent Followed by [facility] for specialized Schizophrenia patients, chart reviewed from last visit, documentation shows patient has history of fetal alcohol syndrome, recurrent major depression and schizophrenia.  - Today he is reporting overall doing well, there was some discussion about adjusting his medications recently based on chart review. On Abilify , Depakote , FLuoxetine , Mirtazapine     Type 2 Diabetes / Morbid Obesity BMI Anti-psychotic/schizophrenia medication causing hyperglycemia Meds:Metformin  XR 500mg  BID admits some GI intolerance Lifestyle: Improved diet - Exercise (Walking regularly for activity and exercise) Denies hypoglycemia, polyuria, visual changes, numbness or tingling     Health Maintenance: Last Colonoscopy 2019, advised repeat in 1 year, now 6 years late had Colonoscopy scheduled for July 2024 but not completed, [facility] re-attempted to  schedule. He has to call them now       11/19/2023   10:23 AM 06/07/2023    4:02 PM 04/08/2023    8:34 AM  Depression screen PHQ 2/9  Decreased Interest 0 0 0  Down, Depressed, Hopeless 0 0 2  PHQ - 2 Score 0 0 2  Altered sleeping 0 0 1  Tired, decreased energy 1 0 1  Change in appetite 2 0 0  Feeling bad or failure about yourself  1 0 1  Trouble concentrating 2 0 1  Moving slowly or fidgety/restless 1 0 0  Suicidal thoughts 1 0 0  PHQ-9 Score 8 0 6  Difficult doing work/chores Somewhat difficult         11/19/2023   10:23 AM 04/08/2023    8:34 AM 12/12/2022    3:05 PM 10/03/2022    2:27 PM  GAD 7 : Generalized Anxiety Score  Nervous, Anxious, on Edge 1 1 0 0  Control/stop worrying 1 1 0 0  Worry too much - different things 1 1 0 1  Trouble relaxing 2 1 0 1  Restless 2 1 0 0  Easily annoyed or irritable 1 1 0 0  Afraid - awful might happen 1 2 0 0  Total GAD 7 Score 9 8 0 2  Anxiety Difficulty Somewhat difficult Somewhat difficult Not difficult at all Not difficult at all    Social History   Tobacco Use   Smoking status: Former    Current packs/day: 0.00    Average packs/day: 1.5 packs/day for 5.0 years (7.5 ttl pk-yrs)    Types: Cigarettes    Start date: 03/26/1996    Quit date: 03/26/2001  Years since quitting: 22.6   Smokeless tobacco: Current    Types: Chew  Vaping Use   Vaping status: Former  Substance Use Topics   Alcohol use: No   Drug use: Never    Review of Systems Per HPI unless specifically indicated above     Objective:    BP 124/64 (BP Location: Right Arm, Patient Position: Sitting, Cuff Size: Large)   Pulse 63   Ht 5' 11 (1.803 m)   Wt 244 lb 2 oz (110.7 kg)   SpO2 96%   BMI 34.05 kg/m   Wt Readings from Last 3 Encounters:  11/19/23 244 lb 2 oz (110.7 kg)  06/02/23 234 lb (106.1 kg)  04/08/23 239 lb (108.4 kg)    Physical Exam Vitals and nursing note reviewed.  Constitutional:      General: He is not in acute distress.     Appearance: Normal appearance. He is well-developed. He is not diaphoretic.     Comments: Well-appearing, comfortable, cooperative  HENT:     Head: Normocephalic and atraumatic.  Eyes:     General:        Right eye: No discharge.        Left eye: No discharge.     Conjunctiva/sclera: Conjunctivae normal.  Cardiovascular:     Rate and Rhythm: Normal rate.  Pulmonary:     Effort: Pulmonary effort is normal.  Skin:    General: Skin is warm and dry.     Findings: No erythema or rash.  Neurological:     Mental Status: He is alert and oriented to person, place, and time.  Psychiatric:        Mood and Affect: Mood normal.        Behavior: Behavior normal.        Thought Content: Thought content normal.     Comments: Well groomed, good eye contact, normal speech and thoughts     Results for orders placed or performed during the hospital encounter of 06/02/23  Resp panel by RT-PCR (RSV, Flu A&B, Covid) Anterior Nasal Swab   Collection Time: 06/02/23  5:41 PM   Specimen: Anterior Nasal Swab  Result Value Ref Range   SARS Coronavirus 2 by RT PCR NEGATIVE NEGATIVE   Influenza A by PCR NEGATIVE NEGATIVE   Influenza B by PCR NEGATIVE NEGATIVE   Resp Syncytial Virus by PCR NEGATIVE NEGATIVE      Assessment & Plan:   Problem List Items Addressed This Visit     CAD in native artery   Relevant Medications   atorvastatin  (LIPITOR) 40 MG tablet   metoprolol  succinate (TOPROL -XL) 25 MG 24 hr tablet   Other Relevant Orders   CBC with Differential/Platelet   Comprehensive metabolic panel with GFR   Chronic systolic congestive heart failure (HCC)   Relevant Medications   atorvastatin  (LIPITOR) 40 MG tablet   metoprolol  succinate (TOPROL -XL) 25 MG 24 hr tablet   Other Relevant Orders   CBC with Differential/Platelet   Comprehensive metabolic panel with GFR   Hyperlipidemia associated with type 2 diabetes mellitus (HCC)   Relevant Medications   atorvastatin  (LIPITOR) 40 MG tablet    metoprolol  succinate (TOPROL -XL) 25 MG 24 hr tablet   Other Relevant Orders   Comprehensive metabolic panel with GFR   Schizophrenia (HCC)   Type 2 diabetes mellitus with other specified complication (HCC) - Primary   Relevant Medications   atorvastatin  (LIPITOR) 40 MG tablet   Other Relevant Orders   Hemoglobin A1c  Comprehensive metabolic panel with GFR     Type 2 diabetes mellitus Requires regular monitoring and management to prevent complications. - Perform blood draw for comprehensive testing. - Schedule diabetic eye exam with [facility]. Pending A1c  Essential hypertension Controlled. Requires ongoing monitoring. Renew rx - Renew blood pressure medication prescription at [facility].  Hyperlipidemia Controlled Reorder Statin - Renew cholesterol medication prescription at [facility].  General Health Maintenance Routine health maintenance necessary to prevent disease. - Schedule colonoscopy, looks like it is scheduled 02/2024 - Advise to get flu shot when available.     Schizophrenia Followed by Psychiatry On med management    Orders Placed This Encounter  Procedures   Hemoglobin A1c   CBC with Differential/Platelet   Comprehensive metabolic panel with GFR    Has the patient fasted?:   Yes    Meds ordered this encounter  Medications   atorvastatin  (LIPITOR) 40 MG tablet    Sig: Take 1 tablet (40 mg total) by mouth at bedtime.    Dispense:  90 tablet    Refill:  3   metoprolol  succinate (TOPROL -XL) 25 MG 24 hr tablet    Sig: Take 0.5 tablets (12.5 mg total) by mouth daily.    Dispense:  45 tablet    Refill:  3    Follow up plan: Return for 6 month fasting lab > 1 week later Annual Physical.  Future labs ordered for 05/21/24  Marsa Officer, DO [facility] 11/19/2023, 10:48 AM

## 2023-11-19 NOTE — Telephone Encounter (Signed)
 Pt called to schedule screening colonoscopy. Pt sent to your phone to lmovm

## 2023-11-19 NOTE — Patient Instructions (Addendum)
 Thank you for coming to the office today.  Labs today  Call to schedule Diabetic Eye Exam  [facility] [address] [place], [place] [address] Phone: [phone] [email or web address]  For Colonoscopy  Please call [facility] [phone] if you are interested in scheduling the colonoscopy procedure. Our office hours are Monday-Thursday 8am to 5pm, Friday 8am to 12pm.   Please schedule a Follow-up Appointment to: Return for 6 month fasting lab > 1 week later Annual Physical.  If you have any other questions or concerns, please feel free to call the office or send a message through MyChart. You may also schedule an earlier appointment if necessary.  Additionally, you may be receiving a survey about your experience at our office within a few days to 1 week by e-mail or mail. We value your feedback.  Marsa Officer, DO [facility], [place]

## 2023-11-20 ENCOUNTER — Ambulatory Visit: Payer: Self-pay | Admitting: Family Medicine

## 2023-11-20 LAB — COMPREHENSIVE METABOLIC PANEL WITH GFR
AG Ratio: 1.7 (calc) (ref 1.0–2.5)
ALT: 23 U/L (ref 9–46)
AST: 18 U/L (ref 10–35)
Albumin: 4.7 g/dL (ref 3.6–5.1)
Alkaline phosphatase (APISO): 46 U/L (ref 35–144)
BUN: 17 mg/dL (ref 7–25)
CO2: 28 mmol/L (ref 20–32)
Calcium: 9.9 mg/dL (ref 8.6–10.3)
Chloride: 101 mmol/L (ref 98–110)
Creat: 1 mg/dL (ref 0.70–1.30)
Globulin: 2.7 g/dL (ref 1.9–3.7)
Glucose, Bld: 99 mg/dL (ref 65–99)
Potassium: 4.7 mmol/L (ref 3.5–5.3)
Sodium: 137 mmol/L (ref 135–146)
Total Bilirubin: 0.5 mg/dL (ref 0.2–1.2)
Total Protein: 7.4 g/dL (ref 6.1–8.1)
eGFR: 89 mL/min/1.73m2 (ref >=60)

## 2023-11-20 LAB — CBC WITH DIFFERENTIAL/PLATELET
Absolute Lymphocytes: 1738 {cells}/uL (ref 850–3900)
Absolute Monocytes: 371 {cells}/uL (ref 200–950)
Basophils Absolute: 42 {cells}/uL (ref 0–200)
Basophils Relative: 0.8 %
Eosinophils Absolute: 122 {cells}/uL (ref 15–500)
Eosinophils Relative: 2.3 %
HCT: 40.2 % (ref 38.5–50.0)
Hemoglobin: 13.4 g/dL (ref 13.2–17.1)
MCH: 30.2 pg (ref 27.0–33.0)
MCHC: 33.3 g/dL (ref 32.0–36.0)
MCV: 90.5 fL (ref 80.0–100.0)
MPV: 11.7 fL (ref 7.5–12.5)
Monocytes Relative: 7 %
Neutro Abs: 3026 {cells}/uL (ref 1500–7800)
Neutrophils Relative %: 57.1 %
Platelets: 216 Thousand/uL (ref 140–400)
RBC: 4.44 Million/uL (ref 4.20–5.80)
RDW: 13.4 % (ref 11.0–15.0)
Total Lymphocyte: 32.8 %
WBC: 5.3 Thousand/uL (ref 3.8–10.8)

## 2023-11-20 LAB — HEMOGLOBIN A1C
Hgb A1c MFr Bld: 6 % — ABNORMAL HIGH (ref <5.7)
Mean Plasma Glucose: 126 mg/dL
eAG (mmol/L): 7 mmol/L

## 2023-11-28 NOTE — Telephone Encounter (Signed)
 Cardiac clearance received 11/21/23 from NP Rickard Helena with [facility].  Patient is cleared to have procedure.  Thanks,  [place], CMA

## 2023-12-04 ENCOUNTER — Emergency Department
Admit: 2023-12-04 | Discharge: 2023-12-05 | Disposition: A | Discharge status: Home / Self Care | Payer: Medicare (Managed Care) | Attending: Emergency Medicine

## 2023-12-04 DIAGNOSIS — F319 Bipolar disorder, unspecified: Secondary | ICD-10-CM | POA: Diagnosis not present

## 2023-12-04 DIAGNOSIS — R071 Chest pain on breathing: Secondary | ICD-10-CM | POA: Diagnosis not present

## 2023-12-04 DIAGNOSIS — Z79899 Other long term (current) drug therapy: Secondary | ICD-10-CM | POA: Diagnosis not present

## 2023-12-04 DIAGNOSIS — J449 Chronic obstructive pulmonary disease, unspecified: Secondary | ICD-10-CM | POA: Diagnosis not present

## 2023-12-04 DIAGNOSIS — R112 Nausea with vomiting, unspecified: Secondary | ICD-10-CM | POA: Diagnosis not present

## 2023-12-04 DIAGNOSIS — R079 Chest pain, unspecified: Secondary | ICD-10-CM | POA: Diagnosis not present

## 2023-12-04 DIAGNOSIS — U071 COVID-19: Secondary | ICD-10-CM | POA: Diagnosis not present

## 2023-12-04 DIAGNOSIS — R059 Cough, unspecified: Secondary | ICD-10-CM | POA: Diagnosis not present

## 2023-12-04 DIAGNOSIS — E119 Type 2 diabetes mellitus without complications: Secondary | ICD-10-CM | POA: Diagnosis not present

## 2023-12-04 DIAGNOSIS — Z7984 Long term (current) use of oral hypoglycemic drugs: Secondary | ICD-10-CM | POA: Diagnosis not present

## 2023-12-04 DIAGNOSIS — I509 Heart failure, unspecified: Secondary | ICD-10-CM | POA: Diagnosis not present

## 2023-12-04 DIAGNOSIS — R0789 Other chest pain: Secondary | ICD-10-CM | POA: Diagnosis not present

## 2023-12-04 MED ORDER — BENZONATATE 100 MG CAPSULE
ORAL_CAPSULE | Freq: Three times a day (TID) | ORAL | 0 refills | 7.00000 days | Status: CP
Start: 2023-12-04 — End: 2023-12-11

## 2023-12-04 MED ORDER — ONDANSETRON 4 MG DISINTEGRATING TABLET
ORAL_TABLET | Freq: Three times a day (TID) | ORAL | 0 refills | 5.00000 days | Status: CP | PRN
Start: 2023-12-04 — End: 2023-12-11

## 2023-12-05 DIAGNOSIS — R0789 Other chest pain: Secondary | ICD-10-CM | POA: Diagnosis not present

## 2024-01-09 NOTE — Progress Notes (Signed)
 Randy [name]                                          MRN: [id number]   01/09/2024   The VBCI Quality Team Specialist reviewed this patient medical record for the purposes of chart review for care gap closure. The following were reviewed: chart review for care gap closure-diabetic eye exam.    VBCI Quality Team

## 2024-01-21 ENCOUNTER — Encounter
Admit: 2024-01-21 | Discharge: 2024-01-21 | Payer: Medicare (Managed Care) | Attending: Internal Medicine | Primary: Internal Medicine

## 2024-01-21 DIAGNOSIS — Z95810 Presence of automatic (implantable) cardiac defibrillator: Secondary | ICD-10-CM | POA: Diagnosis not present

## 2024-01-21 DIAGNOSIS — I509 Heart failure, unspecified: Secondary | ICD-10-CM | POA: Diagnosis not present

## 2024-01-22 ENCOUNTER — Encounter
Admit: 2024-01-22 | Discharge: 2024-01-22 | Payer: Medicare (Managed Care) | Attending: Internal Medicine | Primary: Internal Medicine

## 2024-01-22 DIAGNOSIS — Z95810 Presence of automatic (implantable) cardiac defibrillator: Secondary | ICD-10-CM | POA: Diagnosis not present

## 2024-01-22 DIAGNOSIS — I447 Left bundle-branch block, unspecified: Secondary | ICD-10-CM | POA: Diagnosis not present

## 2024-01-27 DIAGNOSIS — G2401 Drug induced subacute dyskinesia: Secondary | ICD-10-CM | POA: Diagnosis not present

## 2024-01-27 DIAGNOSIS — Z79899 Other long term (current) drug therapy: Secondary | ICD-10-CM | POA: Diagnosis not present

## 2024-01-27 DIAGNOSIS — F17290 Nicotine dependence, other tobacco product, uncomplicated: Secondary | ICD-10-CM | POA: Diagnosis not present

## 2024-01-27 DIAGNOSIS — G252 Other specified forms of tremor: Secondary | ICD-10-CM | POA: Diagnosis not present

## 2024-01-27 DIAGNOSIS — F250 Schizoaffective disorder, bipolar type: Secondary | ICD-10-CM | POA: Diagnosis not present

## 2024-02-12 DIAGNOSIS — J449 Chronic obstructive pulmonary disease, unspecified: Principal | ICD-10-CM

## 2024-02-12 MED ORDER — SPIRONOLACTONE 25 MG TABLET
ORAL_TABLET | Freq: Every day | ORAL | 1 refills | 0.00000 days
Start: 2024-02-12 — End: ?

## 2024-02-12 MED ORDER — FUROSEMIDE 20 MG TABLET
ORAL_TABLET | Freq: Every day | ORAL | 3 refills | 0.00000 days
Start: 2024-02-12 — End: ?

## 2024-02-13 MED ORDER — SPIRONOLACTONE 25 MG TABLET
ORAL_TABLET | Freq: Every day | ORAL | 1 refills | 90.00000 days | Status: CP
Start: 2024-02-13 — End: ?

## 2024-02-13 MED ORDER — FUROSEMIDE 20 MG TABLET
ORAL_TABLET | Freq: Every day | ORAL | 3 refills | 90.00000 days | Status: CP
Start: 2024-02-13 — End: ?

## 2024-02-14 ENCOUNTER — Ambulatory Visit
Admission: RE | Admit: 2024-02-14 | Discharge: 2024-02-14 | Disposition: A | Discharge status: Home / Self Care | Source: Ambulatory Visit | Attending: Emergency Medicine | Admitting: Emergency Medicine

## 2024-02-14 VITALS — BP 100/71 | HR 70 | Temp 98.3°F | Resp 20

## 2024-02-14 DIAGNOSIS — J069 Acute upper respiratory infection, unspecified: Secondary | ICD-10-CM | POA: Diagnosis not present

## 2024-02-14 DIAGNOSIS — R051 Acute cough: Secondary | ICD-10-CM | POA: Diagnosis not present

## 2024-02-14 LAB — POC COVID19/FLU A&B COMBO
Covid Antigen, POC: NEGATIVE
Influenza A Antigen, POC: NEGATIVE
Influenza B Antigen, POC: NEGATIVE

## 2024-02-14 MED ORDER — PREDNISONE 10 MG (21) PO TBPK: ORAL_TABLET | Freq: Every day | ORAL | 0 refills | Status: AC

## 2024-02-14 MED ORDER — BENZONATATE 100 MG PO CAPS: 100.0000 mg | ORAL_CAPSULE | Freq: Three times a day (TID) | ORAL | 0 refills | Status: AC

## 2024-02-14 MED ORDER — PROMETHAZINE-DM 6.25-15 MG/5ML PO SYRP
5.0000 mL | ORAL_SOLUTION | Freq: Every evening | ORAL | 0 refills | Status: AC | PRN
Start: 2024-02-14 — End: ?

## 2024-02-14 MED ORDER — AZITHROMYCIN 250 MG PO TABS
250.0000 mg | ORAL_TABLET | Freq: Every day | ORAL | 0 refills | Status: AC
Start: 2024-02-14 — End: ?

## 2024-02-14 NOTE — Discharge Instructions (Signed)
 Your symptoms today are most likely being caused by a virus and should steadily improve in time it can take up to 7 to 10 days before you truly start to see a turnaround however things will get better  Due to your history of COPD you have been started on antibiotics if symptoms do not worsen, take azithromycin  as directed  Start tomorrow take prednisone  every morning with food to help and relax the airway should help with shortness of breath, may use inhaler as needed in addition to this avoid ibuprofen while taking steroid  You may use Tessalon  pill every 8 hours as needed for cough and may use cough syrup at bedtime to allow you to rest    You can take Tylenol   as needed for fever reduction and pain relief.   For cough: honey 1/2 to 1 teaspoon (you can dilute the honey in water or another fluid).  You can also use guaifenesin and dextromethorphan for cough. You can use a humidifier for chest congestion and cough.  If you don't have a humidifier, you can sit in the bathroom with the hot shower running.      For sore throat: try warm salt water gargles, cepacol lozenges, throat spray, warm tea or water with lemon/honey, popsicles or ice, or OTC cold relief medicine for throat discomfort.   For congestion: take a daily anti-histamine like Zyrtec , Claritin, and a oral decongestant, such as pseudoephedrine.  You can also use Flonase  1-2 sprays in each nostril daily.   It is important to stay hydrated: drink plenty of fluids (water, gatorade/powerade/pedialyte, juices, or teas) to keep your throat moisturized and help further relieve irritation/discomfort.

## 2024-02-14 NOTE — ED Provider Notes (Signed)
 Randy [name]    CSN: [id number] Arrival date & time: 02/14/24  1708      History   Chief Complaint Chief Complaint  Patient presents with   Cough    Dizzy cold symptoms - Entered by patient   Shortness of Breath   Emesis    HPI Randy [name] is a 53 y.o. male.   Patient presents for evaluation of a fever peaking at 100, nasal congestion, sore throat, productive cough, shortness of breath at rest, vomiting and diarrhea beginning 2 days ago.  Tolerable to food and liquids but appetite is decreased.  No known sick contacts prior.  History of COPD.  Denies wheezing.  Has attempted use of Tylenol .  Past Medical History:  Diagnosis Date   Allergy    Bipolar 1 disorder (HCC)    COPD (chronic obstructive pulmonary disease) (HCC)    GERD (gastroesophageal reflux disease)    LBBB (left bundle branch block)    Nonischemic cardiomyopathy (HCC) 05/30/2021   LVEF 35-40%; mild, non-obstructive CAD by LHC.    Patient Active Problem List   Diagnosis Date Noted   Chronic systolic congestive heart failure (HCC) 12/12/2022   RUQ pain 06/08/2021   CAD in native artery 06/08/2021   Nonischemic cardiomyopathy (HCC)    Abnormal stress test    Chest pain 05/30/2021   Closed nondisplaced fracture of head of left radius 10/14/2018   OSA (obstructive sleep apnea) 05/20/2018   Obesity (BMI 30.0-34.9) 11/14/2017   Chronic recurrent major depressive disorder 10/14/2017   Schizophrenia (HCC) 10/14/2017   GERD (gastroesophageal reflux disease) 10/14/2017   Centrilobular emphysema (HCC) 10/14/2017   Type 2 diabetes mellitus with other specified complication (HCC) 10/14/2017   Hyperlipidemia associated with type 2 diabetes mellitus (HCC) 10/14/2017    Past Surgical History:  Procedure Laterality Date   RIGHT/LEFT HEART CATH AND CORONARY ANGIOGRAPHY N/A 05/31/2021   Procedure: RIGHT/LEFT HEART CATH AND CORONARY ANGIOGRAPHY;  Surgeon: Mady Bruckner, MD;  Location: [facility] INVASIVE CV  LAB;  Service: Cardiovascular;  Laterality: N/A;       Home Medications    Prior to Admission medications   Medication Sig Start Date End Date Taking? Authorizing Provider  albuterol  (PROAIR  HFA) 108 (90 Base) MCG/ACT inhaler Inhale 1-2 puffs into the lungs every 6 (six) hours as needed for wheezing or shortness of breath. 11/08/20   Karamalegos, Marsa PARAS, DO  ARIPiprazole  (ABILIFY ) 10 MG tablet Take 2 tablets (20 mg total) by mouth daily. 06/30/21   Karamalegos, Marsa PARAS, DO  atorvastatin  (LIPITOR) 40 MG tablet Take 1 tablet (40 mg total) by mouth at bedtime. 11/19/23   Karamalegos, Marsa PARAS, DO  cetirizine  (ZYRTEC ) 10 MG tablet Take 10 mg by mouth daily as needed for allergies.    [provider]  divalproex  (DEPAKOTE ) 250 MG DR tablet Take 250 mg by mouth at bedtime. + 500mg  = 750mg  nightly, [facility] 10/02/22   [provider]  divalproex  (DEPAKOTE ) 500 MG DR tablet 500 mg. + 250mg  dose nightly = 750mg  05/15/21   [provider]  FLUoxetine  (PROZAC ) 10 MG capsule Take 1 capsule (10 mg total) by mouth daily. 06/01/21 11/19/23  Trudy Anthony HERO, MD  JARDIANCE 10 MG TABS tablet Take 10 mg by mouth daily.    [provider]  losartan  (COZAAR ) 25 MG tablet Take 0.5 tablets (12.5 mg total) by mouth daily. 03/18/23   Karamalegos, Marsa PARAS, DO  metFORMIN  (GLUCOPHAGE -XR) 500 MG 24 hr tablet TAKE ONE TABLET BY MOUTH  TWICE DAILY WITH FOOD 09/13/23   Edman Marsa PARAS, DO  metoprolol  succinate (TOPROL -XL) 25 MG 24 hr tablet Take 0.5 tablets (12.5 mg total) by mouth daily. 11/19/23   Karamalegos, Marsa PARAS, DO  mirtazapine (REMERON) 15 MG tablet TAKE 1/2 TABLET (7.5mg ) BY MOUTH each night BEFORE bed. 07/13/21   [provider]  ondansetron  (ZOFRAN -ODT) 4 MG disintegrating tablet Take 1 tablet (4 mg total) by mouth every 8 (eight) hours as needed for nausea or vomiting. 06/02/23   Menshew, Candida LULLA Kings, PA-C    Family History Family  History  Problem Relation Age of Onset   Alcohol abuse Mother    Cancer Father        colon    Colon cancer Father     Social History Social History   Tobacco Use   Smoking status: Former    Current packs/day: 0.00    Average packs/day: 1.5 packs/day for 5.0 years (7.5 ttl pk-yrs)    Types: Cigarettes    Start date: 03/26/1996    Quit date: 03/26/2001    Years since quitting: 22.9   Smokeless tobacco: Current    Types: Chew  Vaping Use   Vaping status: Former  Substance Use Topics   Alcohol use: No   Drug use: Never     Allergies   Patient has no known allergies.   Review of Systems Review of Systems  Constitutional:  Positive for fever. Negative for activity change, appetite change, chills, diaphoresis, fatigue and unexpected weight change.  HENT:  Positive for congestion and sore throat. Negative for dental problem, drooling, ear discharge, ear pain, facial swelling, hearing loss, mouth sores, nosebleeds, postnasal drip, rhinorrhea, sinus pressure, sinus pain, sneezing, tinnitus, trouble swallowing and voice change.   Respiratory:  Positive for cough and shortness of breath. Negative for apnea, choking, chest tightness, wheezing and stridor.   Cardiovascular: Negative.   Gastrointestinal:  Positive for diarrhea and vomiting. Negative for abdominal distention, abdominal pain, anal bleeding, blood in stool, constipation, nausea and rectal pain.     Physical Exam Triage Vital Signs ED Triage Vitals  Encounter Vitals Group     BP 02/14/24 1750 100/71     Girls Systolic BP Percentile --      Girls Diastolic BP Percentile --      Boys Systolic BP Percentile --      Boys Diastolic BP Percentile --      Pulse Rate 02/14/24 1750 70     Resp 02/14/24 1750 20     Temp 02/14/24 1750 98.3 F (36.8 C)     Temp Source 02/14/24 1750 Oral     SpO2 02/14/24 1750 98 %     Weight --      Height --      Head Circumference --      Peak Flow --      Pain Score 02/14/24 1753 0      Pain Loc --      Pain Education --      Exclude from Growth Chart --    No data found.  Updated Vital Signs BP 100/71 (BP Location: Left Arm)   Pulse 70   Temp 98.3 F (36.8 C) (Oral)   Resp 20   SpO2 98%   Visual Acuity Right Eye Distance:   Left Eye Distance:   Bilateral Distance:    Right Eye Near:   Left Eye Near:    Bilateral Near:     Physical Exam Constitutional:  Appearance: Normal appearance.  HENT:     Right Ear: There is impacted cerumen.     Left Ear: There is impacted cerumen.     Nose: Congestion present. No rhinorrhea.     Mouth/Throat:     Pharynx: No oropharyngeal exudate or posterior oropharyngeal erythema.  Eyes:     Extraocular Movements: Extraocular movements intact.  Cardiovascular:     Rate and Rhythm: Normal rate and regular rhythm.     Pulses: Normal pulses.     Heart sounds: Normal heart sounds.  Pulmonary:     Effort: Pulmonary effort is normal.     Breath sounds: Normal breath sounds.  Neurological:     Mental Status: He is alert and oriented to person, place, and time. Mental status is at baseline.      UC Treatments / Results  Labs (all labs ordered are listed, but only abnormal results are displayed) Labs Reviewed  POC COVID19/FLU A&B COMBO    EKG   Radiology No results found.  Procedures Procedures (including critical care time)  Medications Ordered in UC Medications - No data to display  Initial Impression / Assessment and Plan / UC Course  I have reviewed the triage vital signs and the nursing notes.  Pertinent labs & imaging results that were available during my care of the patient were reviewed by me and considered in my medical decision making (see chart for details).  Acute cough, viral URI with cough  Patient is in no signs of distress nor toxic appearing.  Vital signs are stable.  Low suspicion for pneumonia, pneumothorax or bronchitis and therefore will defer imaging.  COVID and flu testing  negative, discussed findings with patient.  History of COPD empirically placed on azithromycin , additionally prescribed prednisone  Tessalon  and Promethazine  DM for further management.  Advised to use inhaler as needed. May use additional over-the-counter medications as needed for supportive care.  May follow-up with urgent care as needed if symptoms persist or worsen.  Final Clinical Impressions(s) / UC Diagnoses   Final diagnoses:  Acute cough   Discharge Instructions   None    ED Prescriptions   None    PDMP not reviewed this encounter.   Teresa Shelba SAUNDERS, NP 02/14/24 [phone]

## 2024-02-14 NOTE — ED Triage Notes (Signed)
 Patient reports cough, vomiting x 2 today, and SOB x 2 days. Patient denies pain. Patient has been taking Tylenol  with mild relief.

## 2024-03-02 ENCOUNTER — Ambulatory Visit: Admission: RE | Admit: 2024-03-02 | Source: Home / Self Care | Admitting: Gastroenterology

## 2024-03-02 SURGERY — COLONOSCOPY
Anesthesia: General

## 2024-03-16 ENCOUNTER — Other Ambulatory Visit: Payer: Self-pay | Admitting: Family Medicine

## 2024-03-16 DIAGNOSIS — E1169 Type 2 diabetes mellitus with other specified complication: Secondary | ICD-10-CM

## 2024-03-17 NOTE — Progress Notes (Signed)
 Randy [name]                                          MRN: [id number]   03/17/2024   The VBCI Quality Team Specialist reviewed this patient medical record for the purposes of chart review for care gap closure. The following were reviewed: chart review for care gap closure-diabetic eye exam.    VBCI Quality Team

## 2024-03-18 NOTE — Telephone Encounter (Signed)
 Requested Prescriptions  Pending Prescriptions Disp Refills   metFORMIN  (GLUCOPHAGE -XR) 500 MG 24 hr tablet [Pharmacy Med Name: metformin  ER 500 mg tablet,extended release 24 hr] 180 tablet 0    Sig: Take 1 tablet by mouth twice daily WITH FOOD     Endocrinology:  Diabetes - Biguanides Failed - 03/18/2024  1:38 PM      Failed - B12 Level in normal range and within 720 days    No results found for: VITAMINB12       Passed - Cr in normal range and within 360 days    Creat  Date Value Ref Range Status  11/19/2023 1.00 0.70 - 1.30 mg/dL Final   Creatinine, Urine  Date Value Ref Range Status  04/08/2023 112 20 - 320 mg/dL Final         Passed - HBA1C is between 0 and 7.9 and within 180 days    Hgb A1c MFr Bld  Date Value Ref Range Status  11/19/2023 6.0 (H) <5.7 % Final    Comment:    For someone without known diabetes, a hemoglobin  A1c value between 5.7% and 6.4% is consistent with prediabetes and should be confirmed with a  follow-up test. . For someone with known diabetes, a value <7% indicates that their diabetes is well controlled. A1c targets should be individualized based on duration of diabetes, age, comorbid conditions, and other considerations. . This assay result is consistent with an increased risk of diabetes. . Currently, no consensus exists regarding use of hemoglobin A1c for diagnosis of diabetes for children. .          Passed - eGFR in normal range and within 360 days    GFR, Est African American  Date Value Ref Range Status  12/28/2019 99 > OR = 60 mL/min/1.71m2 Final   GFR, Est Non African American  Date Value Ref Range Status  12/28/2019 85 > OR = 60 mL/min/1.19m2 Final   GFR, Estimated  Date Value Ref Range Status  10/16/2021 >60 >60 mL/min Final    Comment:    (NOTE) Calculated using the CKD-EPI Creatinine Equation (2021)    eGFR  Date Value Ref Range Status  11/19/2023 89 > OR = 60 mL/min/1.41m2 Final         Passed - Valid  encounter within last 6 months    Recent Outpatient Visits           4 months ago Type 2 diabetes mellitus with other specified complication, without long-term current use of insulin  [facility])   [facility] [place], Marsa PARAS, DO              Passed - CBC within normal limits and completed in the last 12 months    WBC  Date Value Ref Range Status  11/19/2023 5.3 3.8 - 10.8 Thousand/uL Final   RBC  Date Value Ref Range Status  11/19/2023 4.44 4.20 - 5.80 Million/uL Final   Hemoglobin  Date Value Ref Range Status  11/19/2023 13.4 13.2 - 17.1 g/dL Final   HGB  Date Value Ref Range Status  04/15/2014 14.6 13.0 - 18.0 g/dL Final   HCT  Date Value Ref Range Status  11/19/2023 40.2 38.5 - 50.0 % Final  04/15/2014 43.8 40.0 - 52.0 % Final   MCHC  Date Value Ref Range Status  11/19/2023 33.3 32.0 - 36.0 g/dL Final    Comment:    For adults, a slight decrease in the calculated MCHC value (in  the range of 30 to 32 g/dL) is most likely not clinically significant; however, it should be interpreted with caution in correlation with other red cell parameters and the patient's clinical condition.    [facility]  Date Value Ref Range Status  11/19/2023 30.2 27.0 - 33.0 pg Final   MCV  Date Value Ref Range Status  11/19/2023 90.5 80.0 - 100.0 fL Final  04/15/2014 87 80 - 100 fL Final   No results found for: PLTCOUNTKUC, LABPLAT, POCPLA RDW  Date Value Ref Range Status  11/19/2023 13.4 11.0 - 15.0 % Final  04/15/2014 15.0 (H) 11.5 - 14.5 % Final

## 2024-03-21 ENCOUNTER — Other Ambulatory Visit: Payer: Self-pay

## 2024-03-21 ENCOUNTER — Emergency Department

## 2024-03-21 ENCOUNTER — Emergency Department
Admission: EM | Admit: 2024-03-21 | Discharge: 2024-03-22 | Disposition: A | Attending: Emergency Medicine | Admitting: Emergency Medicine

## 2024-03-21 DIAGNOSIS — N400 Enlarged prostate without lower urinary tract symptoms: Secondary | ICD-10-CM | POA: Insufficient documentation

## 2024-03-21 DIAGNOSIS — K760 Fatty (change of) liver, not elsewhere classified: Secondary | ICD-10-CM | POA: Insufficient documentation

## 2024-03-21 DIAGNOSIS — R509 Fever, unspecified: Secondary | ICD-10-CM | POA: Diagnosis present

## 2024-03-21 DIAGNOSIS — K529 Noninfective gastroenteritis and colitis, unspecified: Secondary | ICD-10-CM | POA: Insufficient documentation

## 2024-03-21 DIAGNOSIS — R112 Nausea with vomiting, unspecified: Secondary | ICD-10-CM | POA: Diagnosis present

## 2024-03-21 DIAGNOSIS — D72829 Elevated white blood cell count, unspecified: Secondary | ICD-10-CM | POA: Insufficient documentation

## 2024-03-21 DIAGNOSIS — R0602 Shortness of breath: Secondary | ICD-10-CM | POA: Diagnosis present

## 2024-03-21 LAB — CBC
HCT: 43.1 % (ref 39.0–52.0)
Hemoglobin: 14.4 g/dL (ref 13.0–17.0)
MCH: 29.7 pg (ref 26.0–34.0)
MCHC: 33.4 g/dL (ref 30.0–36.0)
MCV: 88.9 fL (ref 80.0–100.0)
Platelets: 239 K/uL (ref 150–400)
RBC: 4.85 MIL/uL (ref 4.22–5.81)
RDW: 14 % (ref 11.5–15.5)
WBC: 11.2 K/uL — ABNORMAL HIGH (ref 4.0–10.5)
nRBC: 0 % (ref 0.0–0.2)

## 2024-03-21 LAB — BASIC METABOLIC PANEL WITH GFR
Anion gap: 16 — ABNORMAL HIGH (ref 5–15)
BUN: 21 mg/dL — ABNORMAL HIGH (ref 6–20)
CO2: 19 mmol/L — ABNORMAL LOW (ref 22–32)
Calcium: 9.8 mg/dL (ref 8.9–10.3)
Chloride: 104 mmol/L (ref 98–111)
Creatinine, Ser: 1.06 mg/dL (ref 0.61–1.24)
GFR, Estimated: >60 mL/min
Glucose, Bld: 159 mg/dL — ABNORMAL HIGH (ref 70–99)
Potassium: 4.3 mmol/L (ref 3.5–5.1)
Sodium: 139 mmol/L (ref 135–145)

## 2024-03-21 LAB — RESP PANEL BY RT-PCR (RSV, FLU A&B, COVID)  RVPGX2
Influenza A by PCR: NEGATIVE
Influenza B by PCR: NEGATIVE
Resp Syncytial Virus by PCR: NEGATIVE
SARS Coronavirus 2 by RT PCR: NEGATIVE

## 2024-03-21 LAB — TROPONIN T, HIGH SENSITIVITY
Troponin T High Sensitivity: <15 ng/L (ref 0–19)
Troponin T High Sensitivity: <15 ng/L (ref 0–19)

## 2024-03-21 MED ORDER — SODIUM CHLORIDE 0.9 % IV BOLUS
1000.0000 mL | Freq: Once | INTRAVENOUS | Status: AC
  Administered 2024-03-21: 1000 mL via INTRAVENOUS

## 2024-03-21 MED ORDER — ONDANSETRON HCL 4 MG/2ML IJ SOLN
4.0000 mg | Freq: Once | INTRAMUSCULAR | Status: AC
  Administered 2024-03-21: 4 mg via INTRAVENOUS
  Filled 2024-03-21: qty 2

## 2024-03-21 MED ORDER — IOHEXOL 300 MG/ML  SOLN
100.0000 mL | Freq: Once | INTRAMUSCULAR | Status: AC | PRN
  Administered 2024-03-22: 100 mL via INTRAVENOUS

## 2024-03-21 NOTE — ED Provider Notes (Signed)
 "  [facility] Provider Note    Event Date/Time   First MD Initiated Contact with Patient 03/21/24 2259     (approximate)   History   Chest Pain and Emesis   HPI  Randy [name] is a 53 y.o. male who presents to the emergency department today because of concerns for nausea vomiting diarrhea, abdominal pain, chest pain and fevers.  Symptoms started yesterday.  They have been persistent throughout the day.  His abdominal pain is located on the left side.  Denies similar pain in the past.  He has had measured fevers at home.  Denies any known sick contacts.     Physical Exam   Triage Vital Signs: ED Triage Vitals  Encounter Vitals Group     BP 03/21/24 1724 (!) 143/96     Girls Systolic BP Percentile --      Girls Diastolic BP Percentile --      Boys Systolic BP Percentile --      Boys Diastolic BP Percentile --      Pulse Rate 03/21/24 1724 (!) 112     Resp 03/21/24 1724 16     Temp 03/21/24 1724 98.3 F (36.8 C)     Temp Source 03/21/24 1724 Oral     SpO2 03/21/24 1724 95 %     Weight 03/21/24 1725 235 lb (106.6 kg)     Height 03/21/24 1725 5' 11 (1.803 m)     Head Circumference --      Peak Flow --      Pain Score 03/21/24 1724 10     Pain Loc --      Pain Education --      Exclude from Growth Chart --     Most recent vital signs: Vitals:   03/21/24 1724 03/21/24 2139  BP: (!) 143/96 (!) 143/92  Pulse: (!) 112 87  Resp: 16 17  Temp: 98.3 F (36.8 C) 99.3 F (37.4 C)  SpO2: 95% 97%   General: Awake, alert, oriented. CV:  Good peripheral perfusion. Regular rate and rhythm. Resp:  Normal effort. Lungs clear. Abd:  No distention. Tender to palpation in the left side of the abdomen.  ED Results / Procedures / Treatments   Labs (all labs ordered are listed, but only abnormal results are displayed) Labs Reviewed  BASIC METABOLIC PANEL WITH GFR - Abnormal; Notable for the following components:      Result Value   CO2 19 (*)     Glucose, Bld 159 (*)    BUN 21 (*)    Anion gap 16 (*)    All other components within normal limits  CBC - Abnormal; Notable for the following components:   WBC 11.2 (*)    All other components within normal limits  RESP PANEL BY RT-PCR (RSV, FLU A&B, COVID)  [id number]  TROPONIN T, HIGH SENSITIVITY  TROPONIN T, HIGH SENSITIVITY     EKG  I, Guadalupe Eagles, attending physician, personally viewed and interpreted this EKG  EKG Time: 1725 Rate: 106 Rhythm: atrial sensed ventricular paced rhythm Axis: normal Intervals: qtc 475 QRS: wide ST changes: no st elevation Impression: abnormal ekg  RADIOLOGY I independently interpreted and visualized the CXR. My interpretation: No pneumonia Radiology interpretation:  IMPRESSION:  No acute cardiopulmonary process.   I independently interpreted and visualized the CT abd/pel. My interpretation: No free air Radiology interpretation:  IMPRESSION:  1. Findings consistent with a diarrheal state, without obstruction or bowel  inflammation.  2.  Distal esophageal wall thickening, nonspecific and may reflect esophagitis;  consider endoscopic assessment if indicated.  3. Mild hepatic steatosis.  4. Duplicated inferior vena cava.  5. Mild prostatic hypertrophy.  6. Raf score none.     PROCEDURES:  Critical Care performed: No    MEDICATIONS ORDERED IN ED: Medications - No data to display   IMPRESSION / MDM / ASSESSMENT AND PLAN / ED COURSE  I reviewed the triage vital signs and the nursing notes.                              Differential diagnosis includes, but is not limited to, influenza, viral gastroenteritis, diverticulitis  Patient's presentation is most consistent with acute presentation with potential threat to life or bodily function.   Patient presented to the emergency department today because of concerns for nausea vomiting diarrhea.  On exam patient did have tenderness to the left side of his abdomen.  Mild leukocytosis  in the blood work.  Given concern for possible diverticulitis a CT scan was obtained.  This did not show any concerning intra-abdominal infection.  Did raise concern for likely gastroenteritis.  I discussed this with the patient.  He did feel better after IV fluids and medication here.  Will plan on discharging with prescription for antiemetic and Bentyl .    FINAL CLINICAL IMPRESSION(S) / ED DIAGNOSES   Final diagnoses:  Gastroenteritis    Note:  This document was prepared using Dragon voice recognition software and may include unintentional dictation errors.    Floy Roberts, MD 03/22/24 [phone]  "

## 2024-03-21 NOTE — ED Triage Notes (Signed)
 Pt to ED via POV from home for fever, diarrhea, CP, N/V, and SOB. Pt states can't keep anything down. Started yesterday. Fever running around 99.9 at 1200 today at home. 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 Pt states lives alone and not around anyone sick he is aware of.

## 2024-03-22 MED ORDER — DICYCLOMINE HCL 10 MG PO CAPS: 10.0000 mg | ORAL_CAPSULE | Freq: Three times a day (TID) | ORAL | 0 refills | Status: AC | PRN

## 2024-03-22 MED ORDER — ONDANSETRON 4 MG PO TBDP: 4.0000 mg | ORAL_TABLET | Freq: Three times a day (TID) | ORAL | 0 refills | Status: AC | PRN

## 2024-04-15 DIAGNOSIS — I5022 Chronic systolic (congestive) heart failure: Principal | ICD-10-CM

## 2024-04-15 MED ORDER — ENTRESTO 49 MG-51 MG TABLET
ORAL_TABLET | Freq: Two times a day (BID) | ORAL | 11 refills | 0.00000 days
Start: 2024-04-15 — End: ?

## 2024-04-16 MED ORDER — ENTRESTO 49 MG-51 MG TABLET
ORAL_TABLET | Freq: Two times a day (BID) | ORAL | 2 refills | 30.00000 days | Status: CP
Start: 2024-04-16 — End: ?

## 2024-04-18 ENCOUNTER — Emergency Department
Admit: 2024-04-18 | Discharge: 2024-04-18 | Disposition: A | Discharge status: Home / Self Care | Payer: Medicare (Managed Care)

## 2024-04-21 ENCOUNTER — Encounter
Admit: 2024-04-21 | Discharge: 2024-04-21 | Payer: Medicare (Managed Care) | Attending: Internal Medicine | Primary: Internal Medicine

## 2024-04-22 ENCOUNTER — Encounter
Admit: 2024-04-22 | Discharge: 2024-04-22 | Payer: Medicare (Managed Care) | Attending: Internal Medicine | Primary: Internal Medicine

## 2024-05-21 ENCOUNTER — Other Ambulatory Visit

## 2024-05-29 ENCOUNTER — Encounter: Admitting: Family Medicine

## 2024-06-12 ENCOUNTER — Ambulatory Visit

## 2024-06-17 ENCOUNTER — Ambulatory Visit
